# Patient Record
Sex: Male | Born: 1950 | Race: White | Hispanic: No | Marital: Married | Smoking: Former smoker
Health system: Southern US, Community
[De-identification: ages and names within clinical notes are randomized; demographics above are authoritative.]

## PROBLEM LIST (undated history)

## (undated) DIAGNOSIS — I10 Essential (primary) hypertension: Secondary | ICD-10-CM

## (undated) DIAGNOSIS — E785 Hyperlipidemia, unspecified: Secondary | ICD-10-CM

## (undated) DIAGNOSIS — I209 Angina pectoris, unspecified: Secondary | ICD-10-CM

## (undated) DIAGNOSIS — F419 Anxiety disorder, unspecified: Secondary | ICD-10-CM

## (undated) DIAGNOSIS — I639 Cerebral infarction, unspecified: Secondary | ICD-10-CM

## (undated) DIAGNOSIS — K219 Gastro-esophageal reflux disease without esophagitis: Secondary | ICD-10-CM

## (undated) DIAGNOSIS — G459 Transient cerebral ischemic attack, unspecified: Secondary | ICD-10-CM

## (undated) DIAGNOSIS — I2510 Atherosclerotic heart disease of native coronary artery without angina pectoris: Secondary | ICD-10-CM

## (undated) DIAGNOSIS — M1990 Unspecified osteoarthritis, unspecified site: Secondary | ICD-10-CM

## (undated) DIAGNOSIS — F1910 Other psychoactive substance abuse, uncomplicated: Secondary | ICD-10-CM

## (undated) DIAGNOSIS — I219 Acute myocardial infarction, unspecified: Secondary | ICD-10-CM

## (undated) HISTORY — DX: Essential (primary) hypertension: I10

## (undated) HISTORY — DX: Gastro-esophageal reflux disease without esophagitis: K21.9

## (undated) HISTORY — DX: Hyperlipidemia, unspecified: E78.5

## (undated) HISTORY — DX: Anxiety disorder, unspecified: F41.9

## (undated) HISTORY — DX: Acute myocardial infarction, unspecified: I21.9

## (undated) HISTORY — DX: Atherosclerotic heart disease of native coronary artery without angina pectoris: I25.10

## (undated) HISTORY — DX: Transient cerebral ischemic attack, unspecified: G45.9

## (undated) HISTORY — PX: COLONOSCOPY: SHX174

---

## 2012-03-27 ENCOUNTER — Encounter (HOSPITAL_COMMUNITY): Payer: Self-pay

## 2012-03-27 ENCOUNTER — Emergency Department (HOSPITAL_COMMUNITY): Payer: Self-pay

## 2012-03-27 ENCOUNTER — Emergency Department (HOSPITAL_COMMUNITY)
Admission: EM | Admit: 2012-03-27 | Discharge: 2012-03-27 | Discharge status: Against Medical Advice | Payer: Self-pay | Attending: Emergency Medicine | Admitting: Emergency Medicine

## 2012-03-27 DIAGNOSIS — F17200 Nicotine dependence, unspecified, uncomplicated: Secondary | ICD-10-CM | POA: Insufficient documentation

## 2012-03-27 DIAGNOSIS — Z7982 Long term (current) use of aspirin: Secondary | ICD-10-CM | POA: Insufficient documentation

## 2012-03-27 DIAGNOSIS — M6281 Muscle weakness (generalized): Secondary | ICD-10-CM | POA: Insufficient documentation

## 2012-03-27 DIAGNOSIS — R531 Weakness: Secondary | ICD-10-CM

## 2012-03-27 LAB — CBC WITH DIFFERENTIAL/PLATELET
Basophils Absolute: 0 10*3/uL (ref 0.0–0.1)
Eosinophils Relative: 0 % (ref 0–5)
HCT: 46.6 % (ref 39.0–52.0)
Hemoglobin: 16.5 g/dL (ref 13.0–17.0)
Lymphocytes Relative: 11 % — ABNORMAL LOW (ref 12–46)
Lymphs Abs: 1.1 10*3/uL (ref 0.7–4.0)
MCV: 96.7 fL (ref 78.0–100.0)
Monocytes Absolute: 0.7 10*3/uL (ref 0.1–1.0)
Monocytes Relative: 7 % (ref 3–12)
RDW: 12.6 % (ref 11.5–15.5)
WBC: 10 10*3/uL (ref 4.0–10.5)

## 2012-03-27 LAB — CK TOTAL AND CKMB (NOT AT ARMC)
CK, MB: 2.2 ng/mL (ref 0.3–4.0)
Relative Index: INVALID (ref 0.0–2.5)
Total CK: 66 U/L (ref 7–232)

## 2012-03-27 LAB — COMPREHENSIVE METABOLIC PANEL
BUN: 8 mg/dL (ref 6–23)
CO2: 27 mEq/L (ref 19–32)
Calcium: 9.6 mg/dL (ref 8.4–10.5)
Creatinine, Ser: 0.73 mg/dL (ref 0.50–1.35)
GFR calc Af Amer: >90 mL/min (ref >90)
GFR calc non Af Amer: >90 mL/min (ref >90)
Glucose, Bld: 126 mg/dL — ABNORMAL HIGH (ref 70–99)
Total Bilirubin: 0.6 mg/dL (ref 0.3–1.2)

## 2012-03-27 MED ORDER — LISINOPRIL 20 MG PO TABS: 20.0000 mg | ORAL_TABLET | Freq: Every day | ORAL | Status: DC

## 2012-03-27 MED ORDER — ASPIRIN 81 MG PO CHEW: 81.0000 mg | CHEWABLE_TABLET | Freq: Every day | ORAL | Status: AC

## 2012-03-27 NOTE — ED Provider Notes (Addendum)
61 year old male had about 20 minute episode of numbness in the right side of his nose, right arm, right leg. Symptoms have completely resolved. He noticed he had difficulty holding things in his right hand but did not have any overt weakness. He is a smoker and has not seen a doctor for a long time. His neurologic exam is completely normal. Funduscopic exam is unremarkable. He is noted to be quite hypertensive. He clearly had a TIA and needs to have a TIA with a workup as well as medication started for his blood pressure. Patient is very leery about staying in the hospital. I have pointed out that there is an opportunity cost if he delays getting the TIA evaluation and that he could have a stroke which could have been preventable. He is advised that he can leave AMA if he so desires, but that standard of care is to proceed with TIA work up in the emergency department through the CDU.  Dione Booze, MD 03/27/12 1732   Date: 03/28/2012  Rate: 86  Rhythm: normal sinus rhythm  QRS Axis: normal  Intervals: normal  ST/T Wave abnormalities: normal  Conduction Disutrbances:none  Narrative Interpretation: Left ventricular hypertrophy. No prior ECG available for comparison.  Old EKG Reviewed: none available    Dione Booze, MD 03/28/12 1445

## 2012-03-27 NOTE — ED Provider Notes (Signed)
History     CSN: [id number]  Arrival date & time 03/27/12  1337   First MD Initiated Contact with Patient 03/27/12 1618      Chief Complaint  Patient presents with  . Cerebrovascular Accident    (Consider location/radiation/quality/duration/timing/severity/associated sxs/prior treatment) Patient is a 61 y.o. male presenting with Acute Neurological Problem. The history is provided by the patient.  Cerebrovascular Accident This is a new problem. The current episode started today. The problem occurs rarely. The problem has been resolved. Associated symptoms include numbness (numbness to the right face right arm right leg for 15 minutes.). Pertinent negatives include no abdominal pain, chest pain, congestion, coughing, fatigue, fever, headaches, nausea, neck pain, rash, sore throat, vomiting or weakness. Nothing aggravates the symptoms. He has tried nothing for the symptoms. The treatment provided no relief.    History reviewed. No pertinent past medical history.  History reviewed. No pertinent past surgical history.  History reviewed. No pertinent family history.  History  Substance Use Topics  . Smoking status: Current Everyday Smoker  . Smokeless tobacco: Not on file  . Alcohol Use: Yes      Review of Systems  Constitutional: Negative for fever, activity change, appetite change and fatigue.  HENT: Negative for congestion, sore throat, facial swelling, rhinorrhea, trouble swallowing, neck pain, neck stiffness, voice change and sinus pressure.   Eyes: Negative.   Respiratory: Negative for cough, choking, chest tightness, shortness of breath and wheezing.   Cardiovascular: Negative for chest pain.  Gastrointestinal: Negative for nausea, vomiting and abdominal pain.  Genitourinary: Negative for dysuria, urgency, frequency, hematuria, flank pain and difficulty urinating.  Musculoskeletal: Negative for back pain and gait problem.  Skin: Negative for rash and wound.    Neurological: Positive for numbness (numbness to the right face right arm right leg for 15 minutes.). Negative for seizures, syncope, facial asymmetry, speech difficulty, weakness, light-headedness and headaches.  Psychiatric/Behavioral: Negative for behavioral problems, confusion and agitation. The patient is not nervous/anxious and is not hyperactive.   All other systems reviewed and are negative.    Allergies  Review of patient's allergies indicates no known allergies.  Home Medications   Current Outpatient Rx  Name Route Sig Dispense Refill  . ASPIRIN 81 MG PO CHEW Oral Chew 1 tablet (81 mg total) by mouth daily. 60 tablet 5  . LISINOPRIL 20 MG PO TABS Oral Take 1 tablet (20 mg total) by mouth daily. 30 tablet 3    BP 188/96  Pulse 80  Temp 98.5 F (36.9 C) (Oral)  Resp 12  SpO2 100%  Physical Exam  Nursing note and vitals reviewed. Constitutional: He is oriented to person, place, and time. He appears well-developed and well-nourished. No distress.  HENT:  Head: Normocephalic and atraumatic.  Right Ear: External ear normal.  Left Ear: External ear normal.  Mouth/Throat: No oropharyngeal exudate.  Eyes: Conjunctivae and EOM are normal. Pupils are equal, round, and reactive to light. Right eye exhibits no discharge. Left eye exhibits no discharge.  Neck: Normal range of motion. Neck supple. No JVD present. No tracheal deviation present. No thyromegaly present.  Cardiovascular: Normal rate, regular rhythm, normal heart sounds and intact distal pulses.  Exam reveals no gallop and no friction rub.   No murmur heard. Pulmonary/Chest: Effort normal and breath sounds normal. No respiratory distress. He has no wheezes. He exhibits no tenderness.  Abdominal: Soft. Bowel sounds are normal. He exhibits no distension. There is no tenderness. There is no rebound and no guarding.  Musculoskeletal: Normal range of motion. He exhibits no edema and no tenderness.  Lymphadenopathy:    He  has no cervical adenopathy.  Neurological: He is alert and oriented to person, place, and time. He has normal strength and normal reflexes. No cranial nerve deficit or sensory deficit. He displays a negative Romberg sign. GCS eye subscore is 4. GCS verbal subscore is 5. GCS motor subscore is 6.  Skin: Skin is warm and dry. No rash noted. He is not diaphoretic. No pallor.  Psychiatric: He has a normal mood and affect. His behavior is normal.    ED Course  Procedures (including critical care time)  Labs Reviewed  CBC WITH DIFFERENTIAL - Abnormal; Notable for the following:    MCH 34.2 (*)     Neutrophils Relative 82 (*)     Neutro Abs 8.1 (*)     Lymphocytes Relative 11 (*)     All other components within normal limits  COMPREHENSIVE METABOLIC PANEL - Abnormal; Notable for the following:    Glucose, Bld 126 (*)     All other components within normal limits  CK TOTAL AND CKMB  POCT I-STAT TROPONIN I   Dg Chest 2 View  03/27/2012  *RADIOLOGY REPORT*  Clinical Data: Right side weakness.  CHEST - 2 VIEW  Comparison: None.  Findings: Lungs are clear.  Heart size is normal.  No pneumothorax or pleural fluid.  No focal bony abnormality.  IMPRESSION: No acute disease.  Original Report Authenticated By: Bernadene Bell. Maricela Curet, M.D.   Ct Head Wo Contrast  03/27/2012  *RADIOLOGY REPORT*  Clinical Data: Facial numbness.  Right arm numbness  CT HEAD WITHOUT CONTRAST  Technique:  Contiguous axial images were obtained from the base of the skull through the vertex without contrast.  Comparison: None.  Findings: Ventricle size is normal.  Negative for acute infarct. Negative for hemorrhage or mass lesion.  Small hypodensity in the left putamen appears chronic.  Brainstem and cerebellum are normal. Calvarium is intact.  IMPRESSION: No acute abnormality.  Original Report Authenticated By: Camelia Phenes, M.D.     1. Right sided weakness       MDM  61 year old male patient with no known past medical  history that does not, followup with primary care physician presents with stroke like symptoms. Patient says that he was in his normal state of health today when he felt the right side of his face was numb then felt the right side of his body go nonoccluding his right arm and his right leg. The symptoms lasted 15 minutes and resolved. Patient did not have any weakness difficulty speaking nausea vomiting headache neck pain chest pain or shortness of breath. Here patient with elevated blood pressure that reduced to 170s over 90s. Patient possibly with TIA. Patient had EKG as described below chest x-ray labs which were unremarkable lungs are normal head CT. Given descriptions it was felt the patient could be having TIA. Patient was recommended for ED TIA protocol but patient refused further workup after the CT. He was discussed with patient that MRI was needed to help make the diagnosis of TIA and that without sufficient workup patient is at high risk for having stroke. Patient counseled to 20 minutes and was able to tell us back the risks of not getting the imaging. Patient said he dry followup with primary care physician. Patient was signed out against medical device with a prescription for aspirin and lisinopril given his high blood pressure and his risk for  stroke and was told to followup immediately with his primary care physician for further workup. Patient expressed understanding of this plan and wanted to leave AGAINST MEDICAL ADVICE.  Results for orders placed during the hospital encounter of 03/27/12  CBC WITH DIFFERENTIAL      Component Value Range   WBC 10.0  4.0 - 10.5 K/uL   RBC 4.82  4.22 - 5.81 MIL/uL   Hemoglobin 16.5  13.0 - 17.0 g/dL   HCT 40.9  81.1 - 91.4 %   MCV 96.7  78.0 - 100.0 fL   MCH 34.2 (*) 26.0 - 34.0 pg   MCHC 35.4  30.0 - 36.0 g/dL   RDW 78.2  95.6 - 21.3 %   Platelets 258  150 - 400 K/uL   Neutrophils Relative 82 (*) 43 - 77 %   Neutro Abs 8.1 (*) 1.7 - 7.7 K/uL    Lymphocytes Relative 11 (*) 12 - 46 %   Lymphs Abs 1.1  0.7 - 4.0 K/uL   Monocytes Relative 7  3 - 12 %   Monocytes Absolute 0.7  0.1 - 1.0 K/uL   Eosinophils Relative 0  0 - 5 %   Eosinophils Absolute 0.0  0.0 - 0.7 K/uL   Basophils Relative 0  0 - 1 %   Basophils Absolute 0.0  0.0 - 0.1 K/uL  COMPREHENSIVE METABOLIC PANEL      Component Value Range   Sodium 138  135 - 145 mEq/L   Potassium 4.6  3.5 - 5.1 mEq/L   Chloride 98  96 - 112 mEq/L   CO2 27  19 - 32 mEq/L   Glucose, Bld 126 (*) 70 - 99 mg/dL   BUN 8  6 - 23 mg/dL   Creatinine, Ser 0.86  0.50 - 1.35 mg/dL   Calcium 9.6  8.4 - 57.8 mg/dL   Total Protein 7.5  6.0 - 8.3 g/dL   Albumin 4.7  3.5 - 5.2 g/dL   AST 25  0 - 37 U/L   ALT 21  0 - 53 U/L   Alkaline Phosphatase 59  39 - 117 U/L   Total Bilirubin 0.6  0.3 - 1.2 mg/dL   GFR calc non Af Amer >90  >90 mL/min   GFR calc Af Amer >90  >90 mL/min  CK TOTAL AND CKMB      Component Value Range   Total CK 66  7 - 232 U/L   CK, MB 2.2  0.3 - 4.0 ng/mL   Relative Index RELATIVE INDEX IS INVALID  0.0 - 2.5  POCT I-STAT TROPONIN I      Component Value Range   Troponin i, poc 0.00  0.00 - 0.08 ng/mL   Comment 3                CT Head Wo Contrast (Final result)   Result time:03/27/12 1608    Final result by Rad Results In Interface (03/27/12 16:08:47)    Narrative:   *RADIOLOGY REPORT*  Clinical Data: Facial numbness. Right arm numbness  CT HEAD WITHOUT CONTRAST  Technique: Contiguous axial images were obtained from the base of the skull through the vertex without contrast.  Comparison: None.  Findings: Ventricle size is normal. Negative for acute infarct. Negative for hemorrhage or mass lesion. Small hypodensity in the left putamen appears chronic. Brainstem and cerebellum are normal. Calvarium is intact.  IMPRESSION: No acute abnormality.  Original Report Authenticated By: Camelia Phenes, M.D.  DG Chest 2 View (Final result)   Result  time:03/27/12 1414    Final result by Rad Results In Interface (03/27/12 14:14:18)    Narrative:   *RADIOLOGY REPORT*  Clinical Data: Right side weakness.  CHEST - 2 VIEW  Comparison: None.  Findings: Lungs are clear. Heart size is normal. No pneumothorax or pleural fluid. No focal bony abnormality.  IMPRESSION: No acute disease.  Original Report Authenticated By: Bernadene Bell. Maricela Curet, M.D.    Date: 03/27/2012  Rate: 86  Rhythm: normal sinus rhythm  QRS Axis: normal  Intervals: normal  ST/T Wave abnormalities: normal  Conduction Disutrbances:none  Narrative Interpretation: LVH  Old EKG Reviewed: none available   Case discussed with Dr. Blanche East, MD 03/27/12 2241

## 2012-03-27 NOTE — ED Notes (Signed)
numnbness in face and right arm did not want to work correctly, pt with elevated bp in triage, reports now is back at baseline.

## 2012-03-27 NOTE — ED Notes (Signed)
Glick MD at bedside  

## 2012-03-27 NOTE — ED Notes (Signed)
Pt says that he request to leave, notified Lew Dawes, resident of the same, pt will be signing out AMA

## 2012-03-27 NOTE — ED Notes (Signed)
Patient transported to CT 

## 2012-03-28 NOTE — ED Provider Notes (Signed)
I saw and evaluated the patient, reviewed the resident's note and I agree with the findings and plan.   Dione Booze, MD 03/28/12 [phone]

## 2015-04-15 DIAGNOSIS — Z87898 Personal history of other specified conditions: Secondary | ICD-10-CM | POA: Insufficient documentation

## 2015-04-15 DIAGNOSIS — I10 Essential (primary) hypertension: Secondary | ICD-10-CM | POA: Insufficient documentation

## 2018-04-22 DIAGNOSIS — F1090 Alcohol use, unspecified, uncomplicated: Secondary | ICD-10-CM | POA: Insufficient documentation

## 2019-12-12 ENCOUNTER — Ambulatory Visit: Payer: Self-pay | Admitting: Physician Assistant

## 2021-03-22 DIAGNOSIS — I493 Ventricular premature depolarization: Secondary | ICD-10-CM | POA: Insufficient documentation

## 2021-05-04 LAB — COLOGUARD: COLOGUARD: POSITIVE — AB

## 2021-06-08 ENCOUNTER — Encounter: Payer: Self-pay | Admitting: Gastroenterology

## 2021-06-29 ENCOUNTER — Other Ambulatory Visit: Payer: Self-pay

## 2021-06-29 ENCOUNTER — Encounter: Payer: Self-pay | Admitting: Cardiology

## 2021-06-29 ENCOUNTER — Ambulatory Visit: Payer: Medicare PPO | Admitting: Cardiology

## 2021-06-29 VITALS — BP 163/88 | HR 88 | Temp 98.0°F | Resp 16 | Ht 70.0 in | Wt 176.0 lb

## 2021-06-29 DIAGNOSIS — R079 Chest pain, unspecified: Secondary | ICD-10-CM | POA: Insufficient documentation

## 2021-06-29 DIAGNOSIS — E782 Mixed hyperlipidemia: Secondary | ICD-10-CM

## 2021-06-29 DIAGNOSIS — I10 Essential (primary) hypertension: Secondary | ICD-10-CM

## 2021-06-29 DIAGNOSIS — I209 Angina pectoris, unspecified: Secondary | ICD-10-CM

## 2021-06-29 MED ORDER — NITROGLYCERIN 0.4 MG SL SUBL: 0.4000 mg | SUBLINGUAL_TABLET | SUBLINGUAL | 3 refills | Status: DC | PRN

## 2021-06-29 MED ORDER — ISOSORBIDE MONONITRATE ER 30 MG PO TB24
30.0000 mg | ORAL_TABLET | Freq: Every day | ORAL | 3 refills | Status: DC
Start: 2021-06-29 — End: 2021-07-20

## 2021-06-29 NOTE — Progress Notes (Signed)
Patient referred by [name], MD for exertional chest pain  Subjective:   Edgar Garcia, male    DOB: 29-Mar-1951, 70 y.o.   MRN: [id number]   Chief Complaint  Patient presents with   Chest Pain   Hyperlipidemia     HPI  70 year old male with hypertension, hyperlipidemia, prediabetes, heavy alcohol use, referred for evaluation of exertional chest pain.  Patient is here with his wife today.  He is a Curator, works in Scientific laboratory technician.  He has had hypertension hyperlipidemia for 9 years, but he was.  Most with a TIA episode.  He since been on lisinopril 40 mg daily, amlodipine 10 mg daily, aspirin 81 mg daily, Crestor 10 mg daily.  He stays active, walking 2-3 miles several days a week.  Recently, he has noted episodes of personal burning sensation occurs at top of the head of the hill, improves with rest.  Episodes occur on a regular basis, and fairly impressive up with the symptoms or physical activity.  Blood pressure is elevated today, but usually much lower than this.  He notices that blood pressure is always elevated physician visits, and much lower at home.  He denies any recurrent TIA, stroke symptoms since his 1 episode of TIA 9 years ago.  He denies any shortness of breath, palpitation, orthopnea, PND symptoms.  On a separate note, he recently tested positive for colitis.  He has an upcoming appointment with gastroenterology next week.  Past Medical History:  Diagnosis Date   Hyperlipidemia    TIA (transient ischemic attack)      History reviewed. No pertinent surgical history.   Social History   Tobacco Use  Smoking Status Every Day  Smokeless Tobacco Not on file    Social History   Substance and Sexual Activity  Alcohol Use Yes   Comment: everyday     Family History  Problem Relation Age of Onset   Cancer Mother    Hypertension Sister    Hypertension Sister    Hypertension Sister    Hypertension Brother      Current Outpatient  Medications on File Prior to Visit  Medication Sig Dispense Refill   amLODipine (NORVASC) 5 MG tablet Take 1 tablet by mouth daily.     aspirin 81 MG EC tablet Take 1 tablet by mouth daily.     atorvastatin (LIPITOR) 10 MG tablet Take 1 tablet by mouth daily at 6 (six) AM.     lisinopril (PRINIVIL,ZESTRIL) 20 MG tablet Take 1 tablet (20 mg total) by mouth daily. (Patient taking differently: Take 40 mg by mouth daily.) 30 tablet 3   vitamin B-12 (CYANOCOBALAMIN) 500 MCG tablet Take 1 tablet by mouth daily at 6 (six) AM.     No current facility-administered medications on file prior to visit.    Cardiovascular and other pertinent studies:  EKG 06/29/2021: Sinus rhythm 84 bpm baseline artifact    Recent labs: 06/14/2021: Glucose 96, BUN/Cr 7/0.72. EGFR >90. Na/K 143/4.2. Albumin 5.1. Rest of the CMP normal H/H 16/47. MCV 99.7. Platelets 288 HbA1C 5.5% Chol 164, TG 64, HDL 69, LDL 84 TSH N/A   Review of Systems  Cardiovascular:  Positive for chest pain. Negative for dyspnea on exertion, leg swelling, palpitations and syncope.  Gastrointestinal:        Positive Cologuard test, upcoming visit with gastroenterology.        Vitals:   06/29/21 1321 06/29/21 1334  BP: (!) 185/99 (!) 178/96  Pulse: 96  91  Resp: 16   Temp: 98 F (36.7 C)   SpO2: 97% 97%     Body mass index is 25.25 kg/m. Filed Weights   06/29/21 1321  Weight: 176 lb (79.8 kg)     Objective:   Physical Exam Vitals and nursing note reviewed.  Constitutional:      General: He is not in acute distress. Neck:     Vascular: No JVD.  Cardiovascular:     Rate and Rhythm: Normal rate and regular rhythm.     Pulses: Normal pulses.     Heart sounds: Normal heart sounds. No murmur heard. Pulmonary:     Effort: Pulmonary effort is normal.     Breath sounds: Normal breath sounds. No wheezing or rales.  Musculoskeletal:     Right lower leg: No edema.     Left lower leg: No edema.         Assessment &  Recommendations:    70 year old male with hypertension, hyperlipidemia, prediabetes, heavy alcohol use, referred for evaluation of exertional chest pain.  Exertional chest pain: Symptoms suggestive of angina pectoris.  Continue aspirin 81 mg daily. Started Imdur 30 mg daily.  Also prescribed sublingual nitroglycerin for as needed use. Continue Crestor 10 mg daily for now. Recommend exercise nuclear stress test and echocardiogram. Even if he does have abnormality on stress test, his cardiac risk for colonoscopy should be acceptable.  Further recommendations after above testing.  Hypertension: Blood pressure elevated, suspect component of whitecoat hypertension. With an addition of Imdur, no changes made to his current antihypertensive therapy.  Thank you for referring the patient to [place]. Please feel free to contact with any questions.   Nigel Mormon, MD Pager: [phone] Office: [phone]

## 2021-07-08 ENCOUNTER — Encounter: Payer: Self-pay | Admitting: Gastroenterology

## 2021-07-08 ENCOUNTER — Ambulatory Visit: Payer: Medicare PPO | Admitting: Gastroenterology

## 2021-07-08 ENCOUNTER — Telehealth: Payer: Self-pay | Admitting: *Deleted

## 2021-07-08 VITALS — BP 160/98 | HR 77 | Ht 70.0 in | Wt 175.0 lb

## 2021-07-08 DIAGNOSIS — R195 Other fecal abnormalities: Secondary | ICD-10-CM

## 2021-07-08 MED ORDER — PLENVU 140 G PO SOLR: 1.0000 | ORAL | 0 refills | Status: DC

## 2021-07-08 NOTE — Patient Instructions (Addendum)
You have been scheduled for a colonoscopy. Please follow written instructions given to you at your visit today.  Please pick up your prep supplies at the pharmacy within the next 1-3 days. If you use inhalers (even only as needed), please bring them with you on the day of your procedure. _______________________________________________________ If you are age 72 or older, your body mass index should be between 23-30. Your Body mass index is 25.11 kg/m. If this is out of the aforementioned range listed, please consider follow up with your Primary Care Provider.  If you are age 41 or younger, your body mass index should be between 19-25. Your Body mass index is 25.11 kg/m. If this is out of the aformentioned range listed, please consider follow up with your Primary Care Provider.  ________________________________________________________  The [facility] providers would like to encourage you to use [facility] to communicate with providers for non-urgent requests or questions.  Due to long hold times on the telephone, sending your provider a message by [facility] may be a faster and more efficient way to get a response.  Please allow 48 business hours for a response.  Please remember that this is for non-urgent requests.  _______________________________________________________ Due to recent changes in healthcare laws, you may see the results of your imaging and laboratory studies on MyChart before your provider has had a chance to review them.  We understand that in some cases there may be results that are confusing or concerning to you. Not all laboratory results come back in the same time frame and the provider may be waiting for multiple results in order to interpret others.  Please give [place] 48 hours in order for your provider to thoroughly review all the results before contacting the office for clarification of your results.

## 2021-07-08 NOTE — Progress Notes (Signed)
Referring Provider: [name], MD Primary Care Physician:  [name], MD   Reason for Consultation:  Cologuard + 9/22   IMPRESSION:   Cologuard + without overt bleeding or anemia Stress test planned for next week     -No cardiac symptoms but he has multiple risk factors and a strong family history    -Cardiologist planning a stress test next week Family history of colon polyps (brother)   PLAN: Colonoscopy after clearance by cardiology  I spent over 30 minutes, including independent review of results, communicating results with the patient directly, face-to-face time with the patient, coordinating care, ordering studies and medications as appropriate, and documentation.      HPI: Edgar Garcia is a 70 y.o. male referred for + cologuard.  No symptoms associated with the + cologuard.    No overt GI blood loss. No melena, hematochezia, bright red blood per rectum. No epistaxis, vaginal bleeding, hemoptysis, or hematuria.  He reports a formed bowel movement daily.  No recent changes in bowel habits.  He has largely avoided doctors. No prior colon cancer or screening. He has a history of heavy alcohol consumption.  Referral records mention hypertension, hyperlipidemia, prediabetes, and a history of transient cerebral ischemia in 2013.  He was referred to cardiology given high risk with a history of smoking and a strong family history.  He has a cardiac stress test Monday.  The cardiologist told him to wait to have his colonoscopy until after the stress test.  Brother with colon polyps. No other family history of colon cancer or polyps. Mother and maternal aunt with leukemia. No family history of GI malignancy or uterine or endometrial cancer.   Labs 06/11/2020 showing normal CBC except for an MCV Of 1.3 and RDW of 12.1.  Liver Enzymes Were Normal.  Past Medical History:  Diagnosis Date   Hyperlipidemia    TIA (transient ischemic attack)     History reviewed. No  pertinent surgical history.   Current Outpatient Medications  Medication Sig Dispense Refill   amLODipine (NORVASC) 5 MG tablet Take 1 tablet by mouth daily.     aspirin 81 MG EC tablet Take 1 tablet by mouth daily.     atorvastatin (LIPITOR) 10 MG tablet Take 1 tablet by mouth daily at 6 (six) AM.     isosorbide mononitrate (IMDUR) 30 MG 24 hr tablet Take 1 tablet (30 mg total) by mouth daily. 90 tablet 3   lisinopril (ZESTRIL) 40 MG tablet Take 40 mg by mouth daily.     nitroGLYCERIN (NITROSTAT) 0.4 MG SL tablet Place 1 tablet (0.4 mg total) under the tongue every 5 (five) minutes as needed for chest pain. 90 tablet 3   PEG-KCl-NaCl-NaSulf-Na Asc-C (PLENVU) 140 g SOLR Take 1 kit by mouth as directed. Use coupon: BIN: [id number] PNC: CNRX Group: [id number] ID: [id number] 1 each 0   vitamin B-12 (CYANOCOBALAMIN) 500 MCG tablet Take 1 tablet by mouth daily at 6 (six) AM.     lisinopril (PRINIVIL,ZESTRIL) 20 MG tablet Take 1 tablet (20 mg total) by mouth daily. (Patient taking differently: Take 40 mg by mouth daily.) 30 tablet 3   No current facility-administered medications for this visit.    Allergies as of 07/08/2021   (No Known Allergies)    Family History  Problem Relation Age of Onset   Cancer Mother    Hypertension Sister    Hypertension Sister    Hypertension Sister    Hypertension Brother  Social History   Socioeconomic History   Marital status: Married    Spouse name: Not on file   Number of children: 3   Years of education: Not on file   Highest education level: Not on file  Occupational History   Not on file  Tobacco Use   Smoking status: Former    Packs/day: 0.50    Years: 25.00    Pack years: 12.50    Types: Cigarettes    Quit date: 2013    Years since quitting: 9.8   Smokeless tobacco: Never  Vaping Use   Vaping Use: Never used  Substance and Sexual Activity   Alcohol use: Yes    Comment: everyday   Drug use: No   Sexual activity: Not on file   Other Topics Concern   Not on file  Social History Narrative   Not on file   Social Determinants of Health   Financial Resource Strain: Not on file  Food Insecurity: Not on file  Transportation Needs: Not on file  Physical Activity: Not on file  Stress: Not on file  Social Connections: Not on file  Intimate Partner Violence: Not on file    Review of Systems: 12 system ROS is negative except as noted above.   Physical Exam: General:   Alert,  well-nourished, pleasant and cooperative in NAD Head:  Normocephalic and atraumatic. Eyes:  Sclera clear, no icterus.   Conjunctiva pink. Ears:  Normal auditory acuity. Nose:  No deformity, discharge,  or lesions. Mouth:  No deformity or lesions.   Neck:  Supple; no masses or thyromegaly. Lungs:  Clear throughout to auscultation.   No wheezes. Heart:  Regular rate and rhythm; no murmurs. Abdomen:  Soft,nontender, nondistended, normal bowel sounds, no rebound or guarding. No hepatosplenomegaly.   Rectal:  Deferred  Msk:  Symmetrical. No boney deformities LAD: No inguinal or umbilical LAD Extremities:  No clubbing or edema. Neurologic:  Alert and  oriented x4;  grossly nonfocal Skin:  Intact without significant lesions or rashes. Psych:  Alert and cooperative. Normal mood and affect.      Takeria Marquina L. Tarri Glenn, MD, MPH 07/08/2021, 11:45 AM

## 2021-07-08 NOTE — Telephone Encounter (Signed)
   Edgar Garcia 09-22-1950 [id number]  Dear Dr Rae Lips:  We have scheduled the above named patient for a colonoscopy procedure. Our records show that he is currently undergoing cardiac evaluation (stress test 07/11/21).  Please advise as to whether the patient is cleared by cardiology for colonoscopy procedure.  Please route your response to Dixon Boos, [place] or fax response to [phone].  Sincerely,    [facility]

## 2021-07-11 ENCOUNTER — Ambulatory Visit: Payer: Medicare PPO

## 2021-07-11 ENCOUNTER — Other Ambulatory Visit: Payer: Self-pay

## 2021-07-11 DIAGNOSIS — R079 Chest pain, unspecified: Secondary | ICD-10-CM

## 2021-07-12 NOTE — Telephone Encounter (Signed)
Stress test shows significant abnormalities. After initial medical management for possible CAD, next step would be coronary angiography and possible intervention. Should he have obstructive CAD, would coronary intervention and 3-6 months DAPT and delaying colonoscopy after that be safe? If bleeding risks are high, he could undergo colonoscopy with acceptable cardiac risk, followed by coronary angiography and intervention, as required.  Appreciate your input.  Regards, Dr. Virgina Jock

## 2021-07-15 NOTE — Telephone Encounter (Signed)
If no active bleeding, I would recommend doing coronary angiography and intervention first. If he requires stenting, I would recommend 3-6 months of DAPT. Is that okay from your standpoint?  Thanks MJP

## 2021-07-15 NOTE — Telephone Encounter (Signed)
LVM requesting returned call. Below procedure has been canceled per cardiology request as additional cardiac work up is required.  Appointment Information  Name: Edgar Garcia, Edgar Garcia MRN: [id number]  Date: 08/01/2021 Status: Can  Time: 3:00 PM Length: 30  Visit Type: PROPOFOL COLON [1272] Copay: $0.00  Provider: Thornton Park, MD Department: [facility]  Referring Provider: [name] CSN: [id number]  Notes: positive cologuard/dns  Made On: Canceled: 07/08/2021 11:24 AM 07/15/2021 1:59 PM By: By: Runell Gess, Margarete Horace J  Cancel Rsn: Provider (Requires additional cardiology f/u per cardiologist)

## 2021-07-15 NOTE — Telephone Encounter (Signed)
Pt returned call. Advised about cancellation and rationale for cancellation. Verbalized acceptance and understanding.

## 2021-07-15 NOTE — Telephone Encounter (Signed)
Stress test had significant abnormalities. Recommend holding of colonoscopy until further cardiac workup is completed. Please let him know that I will discuss more regarding further recommendations at next visit.   Thanks MJP

## 2021-07-19 ENCOUNTER — Telehealth: Payer: Self-pay

## 2021-07-19 NOTE — Telephone Encounter (Signed)
Called pt, no answer. Left vm requesting call back?

## 2021-07-19 NOTE — Telephone Encounter (Signed)
Recommend holding for now. I can see him sooner than his scheduled appt. Please ask if him if tomorrow 11/2 2 PM works, and add to the schedule, if it does.  Thanks MJP

## 2021-07-19 NOTE — Telephone Encounter (Signed)
Pt called and stated that he has had headaches and dizziness since starting the IMDUR. Pt says his bp medication usually make him dizzy but it has been worse since starting the isosorbide. He said it has also been making his throat hurt occasionally. Pt would like to stop this medication. Pt's BP has consistently been down, it ranges from 110/70-120/80. Pt's pulse has been up since starting this medication, it is in the 70-80, where before it was in the 60's. Pt noticed this during his stress test. Please advise.

## 2021-07-20 ENCOUNTER — Other Ambulatory Visit: Payer: Self-pay

## 2021-07-20 ENCOUNTER — Ambulatory Visit: Payer: Medicare PPO | Admitting: Cardiology

## 2021-07-20 ENCOUNTER — Encounter: Payer: Self-pay | Admitting: Cardiology

## 2021-07-20 VITALS — BP 170/77 | HR 80 | Temp 98.0°F | Resp 16 | Ht 70.0 in | Wt 174.0 lb

## 2021-07-20 DIAGNOSIS — E782 Mixed hyperlipidemia: Secondary | ICD-10-CM

## 2021-07-20 DIAGNOSIS — R079 Chest pain, unspecified: Secondary | ICD-10-CM

## 2021-07-20 DIAGNOSIS — I10 Essential (primary) hypertension: Secondary | ICD-10-CM

## 2021-07-20 DIAGNOSIS — R9439 Abnormal result of other cardiovascular function study: Secondary | ICD-10-CM | POA: Insufficient documentation

## 2021-07-20 DIAGNOSIS — I209 Angina pectoris, unspecified: Secondary | ICD-10-CM

## 2021-07-20 LAB — PCV MYOCARDIAL PERFUSION WO LEXISCAN
Angina Index: 0
ST Depression (mm): 1.5 mm
TID: 1.23

## 2021-07-20 MED ORDER — METOPROLOL SUCCINATE ER 25 MG PO TB24: 25.0000 mg | ORAL_TABLET | Freq: Every day | ORAL | 3 refills | Status: DC

## 2021-07-20 NOTE — Progress Notes (Signed)
Patient referred by [name], MD for exertional chest pain  Subjective:   Edgar Garcia, male    DOB: March 15, 1951, 70 y.o.   MRN: [id number]   Chief Complaint  Patient presents with   Dizziness   Headache   Follow-up     HPI  70 year old male with hypertension, hyperlipidemia, prediabetes, heavy alcohol use, referred for evaluation of exertional chest pain.  Reviewed recent stress test results with the patient, details below. Patient did not tolerate Imdur due to headache and dizziness. Today, he tells me that he did in fact have severe chest pain during the treadmill part of his nuclear stress test.   His colonoscopy for positive cologuard test is pending, but he denies any frank bleeding.   Initial consultation HPI 06/2021: Patient is here with his wife today.  He is a Curator, works in [employer] laboratory technician.  He has had hypertension hyperlipidemia for 9 years, but he was.  Most with a TIA episode.  He since been on lisinopril 40 mg daily, amlodipine 10 mg daily, aspirin 81 mg daily, Crestor 10 mg daily.  He stays active, walking 2-3 miles several days a week.  Recently, he has noted episodes of personal burning sensation occurs at top of the head of the hill, improves with rest.  Episodes occur on a regular basis, and fairly impressive up with the symptoms or physical activity.  Blood pressure is elevated today, but usually much lower than this.  He notices that blood pressure is always elevated physician visits, and much lower at home.  He denies any recurrent TIA, stroke symptoms since his 1 episode of TIA 9 years ago.  He denies any shortness of breath, palpitation, orthopnea, PND symptoms.  On a separate note, he recently tested positive for colitis.  He has an upcoming appointment with gastroenterology next week.  Current Outpatient Medications on File Prior to Visit  Medication Sig Dispense Refill   amLODipine (NORVASC) 5 MG tablet Take 1 tablet by mouth daily.      aspirin 81 MG EC tablet Take 1 tablet by mouth daily.     atorvastatin (LIPITOR) 10 MG tablet Take 1 tablet by mouth daily at 6 (six) AM.     isosorbide mononitrate (IMDUR) 30 MG 24 hr tablet Take 1 tablet (30 mg total) by mouth daily. 90 tablet 3   lisinopril (PRINIVIL,ZESTRIL) 20 MG tablet Take 1 tablet (20 mg total) by mouth daily. (Patient taking differently: Take 40 mg by mouth daily.) 30 tablet 3   lisinopril (ZESTRIL) 40 MG tablet Take 40 mg by mouth daily.     nitroGLYCERIN (NITROSTAT) 0.4 MG SL tablet Place 1 tablet (0.4 mg total) under the tongue every 5 (five) minutes as needed for chest pain. 90 tablet 3   PEG-KCl-NaCl-NaSulf-Na Asc-C (PLENVU) 140 g SOLR Take 1 kit by mouth as directed. Use coupon: BIN: [id number] PNC: CNRX Group: [id number] ID: [id number] 1 each 0   vitamin B-12 (CYANOCOBALAMIN) 500 MCG tablet Take 1 tablet by mouth daily at 6 (six) AM.     No current facility-administered medications on file prior to visit.    Cardiovascular and other pertinent studies:  Exercise Tetrofosmin stress test 07/12/2021: Exercise nuclear stress test was performed using Bruce protocol. Patient reached 4.6 METS, and 90% of age predicted maximum heart rate. Exercise capacity was low. No chest pain reported, dyspnea and dizziness reported. Heart rate and hemodynamic response were normal. Stress EKG showed sinus tachycardia, nonspecific T wave inversion  leads III, aVF, 1-1.5 mm upsloping ST depression in leads V5, V6 that normalize 1 min into recovery. SPECT images showed small sized, medium intensity, reversible perfusion defect in apical anterior, apical myocardium.  TID 1.23. Stress LVEF 80%.  High risk study due to regional perfusion deficit with TID >1.2.   EKG 06/29/2021: Sinus rhythm 84 bpm baseline artifact   Recent labs: 06/14/2021: Glucose 96, BUN/Cr 7/0.72. EGFR >90. Na/K 143/4.2. Albumin 5.1. Rest of the CMP normal H/H 16/47. MCV 99.7. Platelets 288 HbA1C 5.5% Chol 164, TG  64, HDL 69, LDL 84 TSH N/A   Review of Systems  Cardiovascular:  Positive for chest pain. Negative for dyspnea on exertion, leg swelling, palpitations and syncope.  Gastrointestinal:        Positive Cologuard test, upcoming visit with gastroenterology.        Vitals:   07/20/21 1411 07/20/21 1414  BP: (!) 156/89 (!) 170/77  Pulse: 79 80  Resp: 16   Temp: 98 F (36.7 C)   SpO2: 97%     Body mass index is 24.97 kg/m. Filed Weights   07/20/21 1411  Weight: 174 lb (78.9 kg)     Objective:   Physical Exam Vitals and nursing note reviewed.  Constitutional:      General: He is not in acute distress. Neck:     Vascular: No JVD.  Cardiovascular:     Rate and Rhythm: Normal rate and regular rhythm.     Heart sounds: Normal heart sounds. No murmur heard. Pulmonary:     Effort: Pulmonary effort is normal.     Breath sounds: Normal breath sounds. No wheezing or rales.         Assessment & Recommendations:    70 year old male with hypertension, hyperlipidemia, prediabetes, heavy alcohol use, referred for evaluation of exertional chest pain.  Exertional chest pain: Symptoms suggestive of angina pectoris.  Continue aspirin 81 mg daily. Regional reversible perfusion defects and TD 1.23 s/o high risk findings.  Did not tolerate Imdur due to headache and dizziness.  Continue amlodipine 5 mg. Added metoprolol succinate 25 mg daily, Increase crestor to 40 mg daily for now. In addition to optimal medical therapy, also recommend coronary angiography and possible coronary intervention given his symptoms and high risk stress test findings.   Schedule for cardiac catheterization, and possible intervention. We discussed regarding risks, benefits, alternatives to this including stress testing, CTA and continued medical therapy. Patient wants to proceed. Understands <1-2% risk of death, stroke, MI, urgent CABG, bleeding, infection, renal failure but not limited to these.  I am  optimistic that we can limit his DAPT to 3-6 months with coronary stenting, after which he can proceed with colonoscopy. Fortunately, he does not have any frank bleeding at this time.  I encouraged him to reduce alcohol intake to reduce gastritis, anticipated that he may be on DAPT in the near future.   Hypertension: Blood pressure elevated, suspect component of whitecoat hypertension.Added metyoprolol.  Thank you for referring the patient to [place]. Please feel free to contact with any questions.   Nigel Mormon, MD Pager: [phone] Office: [phone]

## 2021-07-20 NOTE — Telephone Encounter (Signed)
Pt aware.

## 2021-07-22 LAB — CBC
Hematocrit: 43.8 % (ref 37.5–51.0)
Hemoglobin: 15.4 g/dL (ref 13.0–17.7)
MCH: 33.7 pg — ABNORMAL HIGH (ref 26.6–33.0)
MCHC: 35.2 g/dL (ref 31.5–35.7)
MCV: 96 fL (ref 79–97)
Platelets: 308 10*3/uL (ref 150–450)
RBC: 4.57 x10E6/uL (ref 4.14–5.80)
RDW: 11.1 % — ABNORMAL LOW (ref 11.6–15.4)
WBC: 7.8 10*3/uL (ref 3.4–10.8)

## 2021-07-22 LAB — BASIC METABOLIC PANEL
BUN/Creatinine Ratio: 10 (ref 10–24)
BUN: 8 mg/dL (ref 8–27)
CO2: 26 mmol/L (ref 20–29)
Calcium: 9.5 mg/dL (ref 8.6–10.2)
Chloride: 95 mmol/L — ABNORMAL LOW (ref 96–106)
Creatinine, Ser: 0.78 mg/dL (ref 0.76–1.27)
Glucose: 106 mg/dL — ABNORMAL HIGH (ref 70–99)
Potassium: 5 mmol/L (ref 3.5–5.2)
Sodium: 135 mmol/L (ref 134–144)
eGFR: 95 mL/min/{1.73_m2} (ref >59)

## 2021-07-25 ENCOUNTER — Ambulatory Visit: Payer: Medicare PPO | Admitting: Cardiology

## 2021-07-26 ENCOUNTER — Inpatient Hospital Stay (HOSPITAL_COMMUNITY)
Admission: RE | Disposition: A | Discharge status: Home / Self Care | Payer: Self-pay | Source: Home / Self Care | Attending: Cardiology

## 2021-07-26 ENCOUNTER — Inpatient Hospital Stay (HOSPITAL_COMMUNITY)
Admission: RE | Admit: 2021-07-26 | Discharge: 2021-07-28 | DRG: 250 | Disposition: A | Discharge status: Home / Self Care | Payer: Medicare PPO | Attending: Cardiology | Admitting: Cardiology

## 2021-07-26 ENCOUNTER — Encounter (HOSPITAL_COMMUNITY): Payer: Self-pay | Admitting: Cardiology

## 2021-07-26 ENCOUNTER — Other Ambulatory Visit: Payer: Self-pay

## 2021-07-26 DIAGNOSIS — Y838 Other surgical procedures as the cause of abnormal reaction of the patient, or of later complication, without mention of misadventure at the time of the procedure: Secondary | ICD-10-CM | POA: Diagnosis not present

## 2021-07-26 DIAGNOSIS — I4901 Ventricular fibrillation: Secondary | ICD-10-CM | POA: Diagnosis not present

## 2021-07-26 DIAGNOSIS — Z79899 Other long term (current) drug therapy: Secondary | ICD-10-CM

## 2021-07-26 DIAGNOSIS — Z8673 Personal history of transient ischemic attack (TIA), and cerebral infarction without residual deficits: Secondary | ICD-10-CM

## 2021-07-26 DIAGNOSIS — I959 Hypotension, unspecified: Secondary | ICD-10-CM | POA: Diagnosis not present

## 2021-07-26 DIAGNOSIS — I462 Cardiac arrest due to underlying cardiac condition: Secondary | ICD-10-CM | POA: Diagnosis not present

## 2021-07-26 DIAGNOSIS — I214 Non-ST elevation (NSTEMI) myocardial infarction: Secondary | ICD-10-CM | POA: Diagnosis not present

## 2021-07-26 DIAGNOSIS — I472 Ventricular tachycardia, unspecified: Secondary | ICD-10-CM | POA: Diagnosis not present

## 2021-07-26 DIAGNOSIS — E785 Hyperlipidemia, unspecified: Secondary | ICD-10-CM | POA: Diagnosis present

## 2021-07-26 DIAGNOSIS — R079 Chest pain, unspecified: Secondary | ICD-10-CM

## 2021-07-26 DIAGNOSIS — R9439 Abnormal result of other cardiovascular function study: Secondary | ICD-10-CM | POA: Diagnosis present

## 2021-07-26 DIAGNOSIS — E871 Hypo-osmolality and hyponatremia: Secondary | ICD-10-CM | POA: Diagnosis not present

## 2021-07-26 DIAGNOSIS — R7303 Prediabetes: Secondary | ICD-10-CM | POA: Diagnosis present

## 2021-07-26 DIAGNOSIS — Z7982 Long term (current) use of aspirin: Secondary | ICD-10-CM | POA: Diagnosis not present

## 2021-07-26 DIAGNOSIS — I25119 Atherosclerotic heart disease of native coronary artery with unspecified angina pectoris: Secondary | ICD-10-CM | POA: Diagnosis present

## 2021-07-26 DIAGNOSIS — I25118 Atherosclerotic heart disease of native coronary artery with other forms of angina pectoris: Secondary | ICD-10-CM | POA: Diagnosis present

## 2021-07-26 DIAGNOSIS — I209 Angina pectoris, unspecified: Secondary | ICD-10-CM | POA: Diagnosis present

## 2021-07-26 DIAGNOSIS — I97790 Other intraoperative cardiac functional disturbances during cardiac surgery: Secondary | ICD-10-CM | POA: Diagnosis not present

## 2021-07-26 DIAGNOSIS — I10 Essential (primary) hypertension: Secondary | ICD-10-CM | POA: Diagnosis present

## 2021-07-26 DIAGNOSIS — Z9861 Coronary angioplasty status: Secondary | ICD-10-CM

## 2021-07-26 HISTORY — PX: LEFT HEART CATH AND CORONARY ANGIOGRAPHY: CATH118249

## 2021-07-26 HISTORY — PX: CORONARY BALLOON ANGIOPLASTY: CATH118233

## 2021-07-26 LAB — CBC
HCT: 42.6 % (ref 39.0–52.0)
Hemoglobin: 14.9 g/dL (ref 13.0–17.0)
MCH: 33.5 pg (ref 26.0–34.0)
MCHC: 35 g/dL (ref 30.0–36.0)
MCV: 95.7 fL (ref 80.0–100.0)
Platelets: 298 10*3/uL (ref 150–400)
RBC: 4.45 MIL/uL (ref 4.22–5.81)
RDW: 11.2 % — ABNORMAL LOW (ref 11.5–15.5)
WBC: 11.3 10*3/uL — ABNORMAL HIGH (ref 4.0–10.5)
nRBC: 0 % (ref 0.0–0.2)

## 2021-07-26 LAB — POCT ACTIVATED CLOTTING TIME
Activated Clotting Time: 283 seconds
Activated Clotting Time: 324 seconds
Activated Clotting Time: 335 seconds

## 2021-07-26 LAB — CREATININE, SERUM
Creatinine, Ser: 0.71 mg/dL (ref 0.61–1.24)
GFR, Estimated: >60 mL/min (ref >60)

## 2021-07-26 LAB — MRSA NEXT GEN BY PCR, NASAL: MRSA by PCR Next Gen: NOT DETECTED

## 2021-07-26 SURGERY — LEFT HEART CATH AND CORONARY ANGIOGRAPHY
Anesthesia: LOCAL

## 2021-07-26 MED ORDER — MIDAZOLAM HCL 2 MG/2ML IJ SOLN
INTRAMUSCULAR | Status: DC | PRN
  Administered 2021-07-26: 1 mg via INTRAVENOUS

## 2021-07-26 MED ORDER — FENTANYL CITRATE (PF) 100 MCG/2ML IJ SOLN
INTRAMUSCULAR | Status: AC
  Filled 2021-07-26: qty 2

## 2021-07-26 MED ORDER — SODIUM CHLORIDE 0.9 % WEIGHT BASED INFUSION
1.0000 mL/kg/h | INTRAVENOUS | Status: DC
  Administered 2021-07-26: 250 mL/h via INTRAVENOUS

## 2021-07-26 MED ORDER — IOHEXOL 350 MG/ML SOLN
INTRAVENOUS | Status: DC | PRN
  Administered 2021-07-26: 220 mL via INTRA_ARTERIAL

## 2021-07-26 MED ORDER — SODIUM CHLORIDE 0.9% FLUSH: 3.0000 mL | INTRAVENOUS | Status: DC | PRN

## 2021-07-26 MED ORDER — ASPIRIN 81 MG PO CHEW: 81.0000 mg | CHEWABLE_TABLET | ORAL | Status: DC

## 2021-07-26 MED ORDER — ACETAMINOPHEN 325 MG PO TABS: 650.0000 mg | ORAL_TABLET | ORAL | Status: DC | PRN

## 2021-07-26 MED ORDER — HEPARIN (PORCINE) IN NACL 1000-0.9 UT/500ML-% IV SOLN
INTRAVENOUS | Status: DC | PRN
  Administered 2021-07-26 (×2): 500 mL

## 2021-07-26 MED ORDER — CLOPIDOGREL BISULFATE 300 MG PO TABS
ORAL_TABLET | ORAL | Status: AC
  Filled 2021-07-26: qty 2

## 2021-07-26 MED ORDER — HEPARIN SODIUM (PORCINE) 1000 UNIT/ML IJ SOLN
INTRAMUSCULAR | Status: AC
  Filled 2021-07-26: qty 1

## 2021-07-26 MED ORDER — LABETALOL HCL 5 MG/ML IV SOLN: 10.0000 mg | INTRAVENOUS | Status: AC | PRN

## 2021-07-26 MED ORDER — TIROFIBAN HCL IN NACL 5-0.9 MG/100ML-% IV SOLN
0.1500 ug/kg/min | INTRAVENOUS | Status: DC
  Administered 2021-07-26 – 2021-07-28 (×6): 0.15 ug/kg/min via INTRAVENOUS
  Filled 2021-07-26 (×6): qty 100

## 2021-07-26 MED ORDER — SODIUM CHLORIDE 0.9% FLUSH: 3.0000 mL | Freq: Two times a day (BID) | INTRAVENOUS | Status: DC

## 2021-07-26 MED ORDER — FENTANYL CITRATE (PF) 100 MCG/2ML IJ SOLN
INTRAMUSCULAR | Status: DC | PRN
  Administered 2021-07-26: 50 ug via INTRAVENOUS

## 2021-07-26 MED ORDER — NITROGLYCERIN IN D5W 200-5 MCG/ML-% IV SOLN
INTRAVENOUS | Status: AC
  Filled 2021-07-26: qty 250

## 2021-07-26 MED ORDER — VERAPAMIL HCL 2.5 MG/ML IV SOLN
INTRAVENOUS | Status: AC
  Filled 2021-07-26: qty 2

## 2021-07-26 MED ORDER — VERAPAMIL HCL 2.5 MG/ML IV SOLN
INTRAVENOUS | Status: DC | PRN
  Administered 2021-07-26: 10 mL via INTRA_ARTERIAL

## 2021-07-26 MED ORDER — HYDRALAZINE HCL 20 MG/ML IJ SOLN: 10.0000 mg | INTRAMUSCULAR | Status: AC | PRN

## 2021-07-26 MED ORDER — HEPARIN (PORCINE) IN NACL 1000-0.9 UT/500ML-% IV SOLN
INTRAVENOUS | Status: AC
  Filled 2021-07-26: qty 1000

## 2021-07-26 MED ORDER — SODIUM CHLORIDE 0.9 % IV SOLN: INTRAVENOUS | Status: AC

## 2021-07-26 MED ORDER — HEPARIN SODIUM (PORCINE) 1000 UNIT/ML IJ SOLN
INTRAMUSCULAR | Status: DC | PRN
  Administered 2021-07-26: 3000 [IU] via INTRAVENOUS
  Administered 2021-07-26 (×2): 4000 [IU] via INTRAVENOUS
  Administered 2021-07-26: 3000 [IU] via INTRAVENOUS

## 2021-07-26 MED ORDER — SODIUM CHLORIDE 0.9% FLUSH
3.0000 mL | Freq: Two times a day (BID) | INTRAVENOUS | Status: DC
  Administered 2021-07-27 – 2021-07-28 (×3): 3 mL via INTRAVENOUS

## 2021-07-26 MED ORDER — LIDOCAINE HCL (PF) 1 % IJ SOLN
INTRAMUSCULAR | Status: DC | PRN
  Administered 2021-07-26 (×2): 2 mL

## 2021-07-26 MED ORDER — ONDANSETRON HCL 4 MG/2ML IJ SOLN: 4.0000 mg | Freq: Four times a day (QID) | INTRAMUSCULAR | Status: DC | PRN

## 2021-07-26 MED ORDER — SODIUM CHLORIDE 0.9 % IV SOLN: 250.0000 mL | INTRAVENOUS | Status: DC | PRN

## 2021-07-26 MED ORDER — CHLORHEXIDINE GLUCONATE CLOTH 2 % EX PADS
6.0000 | MEDICATED_PAD | Freq: Every day | CUTANEOUS | Status: DC
  Administered 2021-07-27: 6 via TOPICAL

## 2021-07-26 MED ORDER — NITROGLYCERIN 1 MG/10 ML FOR IR/CATH LAB
INTRA_ARTERIAL | Status: AC
  Filled 2021-07-26: qty 10

## 2021-07-26 MED ORDER — MIDAZOLAM HCL 2 MG/2ML IJ SOLN
INTRAMUSCULAR | Status: AC
  Filled 2021-07-26: qty 2

## 2021-07-26 MED ORDER — NITROGLYCERIN IN D5W 200-5 MCG/ML-% IV SOLN: 5.0000 ug/min | INTRAVENOUS | Status: DC

## 2021-07-26 MED ORDER — LIDOCAINE HCL (PF) 1 % IJ SOLN
INTRAMUSCULAR | Status: AC
  Filled 2021-07-26: qty 30

## 2021-07-26 MED ORDER — NITROGLYCERIN IN D5W 200-5 MCG/ML-% IV SOLN
INTRAVENOUS | Status: AC | PRN
  Administered 2021-07-26: 20 ug/min via INTRAVENOUS

## 2021-07-26 MED ORDER — SODIUM CHLORIDE 0.9 % WEIGHT BASED INFUSION
3.0000 mL/kg/h | INTRAVENOUS | Status: AC
  Administered 2021-07-26: 3 mL/kg/h via INTRAVENOUS

## 2021-07-26 MED ORDER — CLOPIDOGREL BISULFATE 75 MG PO TABS: 75.0000 mg | ORAL_TABLET | Freq: Every day | ORAL | Status: DC

## 2021-07-26 MED ORDER — HEPARIN SODIUM (PORCINE) 5000 UNIT/ML IJ SOLN
5000.0000 [IU] | Freq: Three times a day (TID) | INTRAMUSCULAR | Status: DC
  Administered 2021-07-27 – 2021-07-28 (×4): 5000 [IU] via SUBCUTANEOUS
  Filled 2021-07-26 (×4): qty 1

## 2021-07-26 MED ORDER — ATORVASTATIN CALCIUM 40 MG PO TABS
40.0000 mg | ORAL_TABLET | Freq: Every evening | ORAL | Status: DC
  Administered 2021-07-26 – 2021-07-27 (×2): 40 mg via ORAL
  Filled 2021-07-26 (×2): qty 1

## 2021-07-26 SURGICAL SUPPLY — 31 items
BALLN SAPPHIRE 1.5X10 (BALLOONS) ×2
BALLOON SAPPHIRE 1.5X10 (BALLOONS) IMPLANT
CATH INFINITI 5 FR 3DRC (CATHETERS) ×1 IMPLANT
CATH INFINITI 5 FR AR1 MOD (CATHETERS) ×1 IMPLANT
CATH INFINITI JR4 5F (CATHETERS) ×1 IMPLANT
CATH LAUNCHER 6FR EBU 3.75 (CATHETERS) ×1 IMPLANT
CATH OPTICROSS HD (CATHETERS) ×1 IMPLANT
CATH OPTITORQUE TIG 4.0 5F (CATHETERS) ×1 IMPLANT
CATH SHOCKWAVE 2.5X12 (CATHETERS) IMPLANT
CATH TELEPORT (CATHETERS) ×1 IMPLANT
CATH VISTA GUIDE 6FR XBLAD3.5 (CATHETERS) ×1 IMPLANT
CATHETER SHOCKWAVE 2.5X12 (CATHETERS) ×2
COVER SWIFTLINK CONNECTOR (BAG) ×1 IMPLANT
DEVICE RAD COMP TR BAND LRG (VASCULAR PRODUCTS) ×1 IMPLANT
GLIDESHEATH SLEND A-KIT 6F 22G (SHEATH) ×1 IMPLANT
GUIDEWIRE INQWIRE 1.5J.035X260 (WIRE) IMPLANT
INQWIRE 1.5J .035X260CM (WIRE) ×6
KIT ENCORE 26 ADVANTAGE (KITS) ×1 IMPLANT
KIT HEART LEFT (KITS) ×2 IMPLANT
KIT HEMO VALVE WATCHDOG (MISCELLANEOUS) ×1 IMPLANT
PACK CARDIAC CATHETERIZATION (CUSTOM PROCEDURE TRAY) ×2 IMPLANT
SHEATH PROBE COVER 6X72 (BAG) ×1 IMPLANT
SLED PULL BACK IVUS (MISCELLANEOUS) ×1 IMPLANT
TRANSDUCER W/STOPCOCK (MISCELLANEOUS) ×2 IMPLANT
TUBING CIL FLEX 10 FLL-RA (TUBING) ×2 IMPLANT
WIRE ASAHI FIELDER XT 190CM (WIRE) ×1 IMPLANT
WIRE ASAHI PROWATER 180CM (WIRE) ×1 IMPLANT
WIRE COUGAR XT STRL 190CM (WIRE) ×1 IMPLANT
WIRE COUGAR XT STRL 300CM (WIRE) ×1 IMPLANT
WIRE GUIDE ASAHI EXTENSION 165 (WIRE) ×1 IMPLANT
WIRE HI TORQ WHISPER MS 190CM (WIRE) ×1 IMPLANT

## 2021-07-26 NOTE — CV Procedure (Signed)
Unsuccessful mid LAD PCI Dissection with partial vessel closure. VF arrest, successful defibrillation within 30 secs. Hemodynamically and electrically stable. Recommend Aspirin/Aggrastat overnight.   Nigel Mormon, MD Pager: [phone] Office: [phone]

## 2021-07-26 NOTE — H&P (Signed)
OV 11/2 copied for documentation     Patient referred by [name], MD for exertional chest pain  Subjective:   Edgar Garcia, male    DOB: 24-Jan-1951, 70 y.o.   MRN: [id number]   Chief Complaint  Patient presents with   Dizziness   Headache   Follow-up     HPI  70 year old male with hypertension, hyperlipidemia, prediabetes, heavy alcohol use, referred for evaluation of exertional chest pain.  Reviewed recent stress test results with the patient, details below. Patient did not tolerate Imdur due to headache and dizziness. Today, he tells me that he did in fact have severe chest pain during the treadmill part of his nuclear stress test.   His colonoscopy for positive cologuard test is pending, but he denies any frank bleeding.   Initial consultation HPI 06/2021: Patient is here with his wife today.  He is a Curator, works in [employer] laboratory technician.  He has had hypertension hyperlipidemia for 9 years, but he was.  Most with a TIA episode.  He since been on lisinopril 40 mg daily, amlodipine 10 mg daily, aspirin 81 mg daily, Crestor 10 mg daily.  He stays active, walking 2-3 miles several days a week.  Recently, he has noted episodes of personal burning sensation occurs at top of the head of the hill, improves with rest.  Episodes occur on a regular basis, and fairly impressive up with the symptoms or physical activity.  Blood pressure is elevated today, but usually much lower than this.  He notices that blood pressure is always elevated physician visits, and much lower at home.  He denies any recurrent TIA, stroke symptoms since his 1 episode of TIA 9 years ago.  He denies any shortness of breath, palpitation, orthopnea, PND symptoms.  On a separate note, he recently tested positive for colitis.  He has an upcoming appointment with gastroenterology next week.  Current Outpatient Medications on File Prior to Visit  Medication Sig Dispense Refill   amLODipine (NORVASC) 5 MG  tablet Take 1 tablet by mouth daily.     aspirin 81 MG EC tablet Take 1 tablet by mouth daily.     atorvastatin (LIPITOR) 10 MG tablet Take 1 tablet by mouth daily at 6 (six) AM.     isosorbide mononitrate (IMDUR) 30 MG 24 hr tablet Take 1 tablet (30 mg total) by mouth daily. 90 tablet 3   lisinopril (PRINIVIL,ZESTRIL) 20 MG tablet Take 1 tablet (20 mg total) by mouth daily. (Patient taking differently: Take 40 mg by mouth daily.) 30 tablet 3   lisinopril (ZESTRIL) 40 MG tablet Take 40 mg by mouth daily.     nitroGLYCERIN (NITROSTAT) 0.4 MG SL tablet Place 1 tablet (0.4 mg total) under the tongue every 5 (five) minutes as needed for chest pain. 90 tablet 3   PEG-KCl-NaCl-NaSulf-Na Asc-C (PLENVU) 140 g SOLR Take 1 kit by mouth as directed. Use coupon: BIN: [id number] PNC: CNRX Group: [id number] ID: [id number] 1 each 0   vitamin B-12 (CYANOCOBALAMIN) 500 MCG tablet Take 1 tablet by mouth daily at 6 (six) AM.     No current facility-administered medications on file prior to visit.    Cardiovascular and other pertinent studies:  Exercise Tetrofosmin stress test 07/12/2021: Exercise nuclear stress test was performed using Bruce protocol. Patient reached 4.6 METS, and 90% of age predicted maximum heart rate. Exercise capacity was low. No chest pain reported, dyspnea and dizziness reported. Heart rate and hemodynamic response were normal. Stress EKG  showed sinus tachycardia, nonspecific T wave inversion leads III, aVF, 1-1.5 mm upsloping ST depression in leads V5, V6 that normalize 1 min into recovery. SPECT images showed small sized, medium intensity, reversible perfusion defect in apical anterior, apical myocardium.  TID 1.23. Stress LVEF 80%.  High risk study due to regional perfusion deficit with TID >1.2.   EKG 06/29/2021: Sinus rhythm 84 bpm baseline artifact   Recent labs: 06/14/2021: Glucose 96, BUN/Cr 7/0.72. EGFR >90. Na/K 143/4.2. Albumin 5.1. Rest of the CMP normal H/H 16/47. MCV 99.7.  Platelets 288 HbA1C 5.5% Chol 164, TG 64, HDL 69, LDL 84 TSH N/A   Review of Systems  Cardiovascular:  Positive for chest pain. Negative for dyspnea on exertion, leg swelling, palpitations and syncope.  Gastrointestinal:        Positive Cologuard test, upcoming visit with gastroenterology.        Vitals:   07/20/21 1411 07/20/21 1414  BP: (!) 156/89 (!) 170/77  Pulse: 79 80  Resp: 16   Temp: 98 F (36.7 C)   SpO2: 97%     Body mass index is 24.97 kg/m. Filed Weights   07/20/21 1411  Weight: 174 lb (78.9 kg)     Objective:   Physical Exam Vitals and nursing note reviewed.  Constitutional:      General: He is not in acute distress. Neck:     Vascular: No JVD.  Cardiovascular:     Rate and Rhythm: Normal rate and regular rhythm.     Heart sounds: Normal heart sounds. No murmur heard. Pulmonary:     Effort: Pulmonary effort is normal.     Breath sounds: Normal breath sounds. No wheezing or rales.         Assessment & Recommendations:    70 year old male with hypertension, hyperlipidemia, prediabetes, heavy alcohol use, referred for evaluation of exertional chest pain.  Exertional chest pain: Symptoms suggestive of angina pectoris.  Continue aspirin 81 mg daily. Regional reversible perfusion defects and TD 1.23 s/o high risk findings.  Did not tolerate Imdur due to headache and dizziness.  Continue amlodipine 5 mg. Added metoprolol succinate 25 mg daily, Increase crestor to 40 mg daily for now. In addition to optimal medical therapy, also recommend coronary angiography and possible coronary intervention given his symptoms and high risk stress test findings.   Schedule for cardiac catheterization, and possible intervention. We discussed regarding risks, benefits, alternatives to this including stress testing, CTA and continued medical therapy. Patient wants to proceed. Understands <1-2% risk of death, stroke, MI, urgent CABG, bleeding, infection, renal failure  but not limited to these.  I am optimistic that we can limit his DAPT to 3-6 months with coronary stenting, after which he can proceed with colonoscopy. Fortunately, he does not have any frank bleeding at this time.  I encouraged him to reduce alcohol intake to reduce gastritis, anticipated that he may be on DAPT in the near future.   Hypertension: Blood pressure elevated, suspect component of whitecoat hypertension.Added metyoprolol.  Thank you for referring the patient to [place]. Please feel free to contact with any questions.   Nigel Mormon, MD Pager: [phone] Office: [phone]

## 2021-07-26 NOTE — Interval H&P Note (Signed)
History and Physical Interval Note:  07/26/2021 12:54 PM  Edgar Garcia  has presented today for surgery, with the diagnosis of chest pain.  The various methods of treatment have been discussed with the patient and family. After consideration of risks, benefits and other options for treatment, the patient has consented to  Procedure(s): LEFT HEART CATH AND CORONARY ANGIOGRAPHY (N/A) as a surgical intervention.  The patient's history has been reviewed, patient examined, no change in status, stable for surgery.  I have reviewed the patient's chart and labs.  Questions were answered to the patient's satisfaction.    2016/2017 Appropriate Use Criteria for Coronary Revascularization Symptom Status: Ischemic Symptoms  Non-invasive Testing: High risk  If no or indeterminate stress test, FFR/iFR results in all diseased vessels: N/A  Diabetes Mellitus: Yes  S/P CABG: No  Antianginal therapy (number of long-acting drugs): >=2  Patient undergoing renal transplant: No  Patient undergoing percutaneous valve procedure: No  1 Vessel Disease PCI CABG  No proximal LAD involvement, No proximal left dominant LCX involvement A (8); Indication 2 M (6); Indication 2  Proximal left dominant LCX involvement A (8); Indication 5 A (8); Indication 5  Proximal LAD involvement A (8); Indication 5 A (8); Indication 5  2 Vessel Disease  No proximal LAD involvement A (8); Indication 8 A (7); Indication 8  Proximal LAD involvement A (8); Indication 14 A (9); Indication 14  3 Vessel Disease  Low disease complexity (e.g., focal stenoses, SYNTAX <=22) A (7); Indication 19 A (9); Indication 19  Intermediate or high disease complexity (e.g., SYNTAX >=23) M (6); Indication 23 A (9); Indication 23  Left Main Disease  Isolated LMCA disease: ostial or midshaft A (7); Indication 24 A (9); Indication 24  Isolated LMCA disease: bifurcation involvement M (6); Indication 25 A (9); Indication 25  LMCA ostial or midshaft, concurrent low  disease burden multivessel disease (e.g., 1-2 additional focal stenoses, SYNTAX <=22) A (7); Indication 26 A (9); Indication 26  LMCA ostial or midshaft, concurrent intermediate or high disease burden multivessel disease (e.g., 1-2 additional bifurcation stenoses, long stenoses, SYNTAX >=23) M (4); Indication 27 A (9); Indication 27  LMCA bifurcation involvement, concurrent low disease burden multivessel disease (e.g., 1-2 additional focal stenoses, SYNTAX <=22) M (6); Indication 28 A (9); Indication 28  LMCA bifurcation involvement, concurrent intermediate or high disease burden multivessel disease (e.g., 1-2 additional bifurcation stenoses, long stenoses, SYNTAX >=23) R (3); Indication 29 A (9); Indication [place]

## 2021-07-26 NOTE — Plan of Care (Signed)

## 2021-07-27 ENCOUNTER — Other Ambulatory Visit (HOSPITAL_COMMUNITY): Payer: Self-pay

## 2021-07-27 ENCOUNTER — Inpatient Hospital Stay (HOSPITAL_COMMUNITY): Payer: Medicare PPO

## 2021-07-27 DIAGNOSIS — I214 Non-ST elevation (NSTEMI) myocardial infarction: Secondary | ICD-10-CM

## 2021-07-27 LAB — ECHOCARDIOGRAM LIMITED
Area-P 1/2: 4.41 cm2
Height: 70.5 in
S' Lateral: 2.71 cm
Single Plane A4C EF: 58.9 %
Weight: 2620.83 oz

## 2021-07-27 LAB — BRAIN NATRIURETIC PEPTIDE: B Natriuretic Peptide: 181.2 pg/mL — ABNORMAL HIGH (ref 0.0–100.0)

## 2021-07-27 LAB — TROPONIN I (HIGH SENSITIVITY)
Troponin I (High Sensitivity): 3798 ng/L (ref <18)
Troponin I (High Sensitivity): 3255 ng/L (ref <18)

## 2021-07-27 LAB — LIPID PANEL
Cholesterol: 118 mg/dL (ref 0–200)
HDL: 50 mg/dL (ref >40)
LDL Cholesterol: 58 mg/dL (ref 0–99)
Total CHOL/HDL Ratio: 2.4 RATIO
Triglycerides: 50 mg/dL (ref <150)
VLDL: 10 mg/dL (ref 0–40)

## 2021-07-27 LAB — BASIC METABOLIC PANEL
Anion gap: 9 (ref 5–15)
BUN: 6 mg/dL — ABNORMAL LOW (ref 8–23)
CO2: 23 mmol/L (ref 22–32)
Calcium: 8.6 mg/dL — ABNORMAL LOW (ref 8.9–10.3)
Chloride: 100 mmol/L (ref 98–111)
Creatinine, Ser: 0.66 mg/dL (ref 0.61–1.24)
GFR, Estimated: >60 mL/min (ref >60)
Glucose, Bld: 98 mg/dL (ref 70–99)
Potassium: 3.6 mmol/L (ref 3.5–5.1)
Sodium: 132 mmol/L — ABNORMAL LOW (ref 135–145)

## 2021-07-27 LAB — CBC
HCT: 42.8 % (ref 39.0–52.0)
Hemoglobin: 15.2 g/dL (ref 13.0–17.0)
MCH: 34.5 pg — ABNORMAL HIGH (ref 26.0–34.0)
MCHC: 35.5 g/dL (ref 30.0–36.0)
MCV: 97.1 fL (ref 80.0–100.0)
Platelets: 278 10*3/uL (ref 150–400)
RBC: 4.41 MIL/uL (ref 4.22–5.81)
RDW: 11.4 % — ABNORMAL LOW (ref 11.5–15.5)
WBC: 8.9 10*3/uL (ref 4.0–10.5)
nRBC: 0 % (ref 0.0–0.2)

## 2021-07-27 LAB — HEMOGLOBIN A1C
Hgb A1c MFr Bld: 5.6 % (ref 4.8–5.6)
Mean Plasma Glucose: 114.02 mg/dL

## 2021-07-27 LAB — MAGNESIUM: Magnesium: 1.9 mg/dL (ref 1.7–2.4)

## 2021-07-27 MED ORDER — TICAGRELOR 90 MG PO TABS
180.0000 mg | ORAL_TABLET | Freq: Once | ORAL | Status: AC
  Administered 2021-07-27: 180 mg via ORAL
  Filled 2021-07-27: qty 2

## 2021-07-27 MED ORDER — AMLODIPINE BESYLATE 5 MG PO TABS
5.0000 mg | ORAL_TABLET | Freq: Every day | ORAL | Status: DC
  Administered 2021-07-27 – 2021-07-28 (×2): 5 mg via ORAL
  Filled 2021-07-27 (×2): qty 1

## 2021-07-27 MED ORDER — ASPIRIN EC 81 MG PO TBEC
81.0000 mg | DELAYED_RELEASE_TABLET | Freq: Every day | ORAL | Status: DC
  Administered 2021-07-27 – 2021-07-28 (×2): 81 mg via ORAL
  Filled 2021-07-27 (×2): qty 1

## 2021-07-27 MED ORDER — METOPROLOL SUCCINATE ER 25 MG PO TB24
25.0000 mg | ORAL_TABLET | Freq: Every day | ORAL | Status: DC
  Administered 2021-07-27 – 2021-07-28 (×2): 25 mg via ORAL
  Filled 2021-07-27 (×2): qty 1

## 2021-07-27 MED ORDER — POTASSIUM CHLORIDE CRYS ER 20 MEQ PO TBCR
40.0000 meq | EXTENDED_RELEASE_TABLET | Freq: Once | ORAL | Status: AC
  Administered 2021-07-27: 40 meq via ORAL
  Filled 2021-07-27: qty 2

## 2021-07-27 MED ORDER — TICAGRELOR 90 MG PO TABS
90.0000 mg | ORAL_TABLET | Freq: Two times a day (BID) | ORAL | Status: DC
  Administered 2021-07-27 – 2021-07-28 (×2): 90 mg via ORAL
  Filled 2021-07-27 (×3): qty 1

## 2021-07-27 MED FILL — Nitroglycerin IV Soln 100 MCG/ML in D5W: INTRA_ARTERIAL | Qty: 10 | Status: AC

## 2021-07-27 NOTE — Progress Notes (Signed)
Subjective:  No retrosternal burning Has epigastric discomfort with certain movement  Patient had one episode of flushing, sinus bradycardia (conformed on telemetry) and hypotension while sitting in recliner. Patient was on 5 mic of nitro at this time. He was in bed since yesterday evening, until this morning. Patient had similar episode when nitro was first started on 07/26/21 at 20 mics. Currently off mic. Denies any dizziness. He had headache with nitro, now improving. Still has shoulder pain.   Objective:  Vital Signs in the last 24 hours: Temp:  [97.8 F (36.6 C)-98.7 F (37.1 C)] 98.2 F (36.8 C) (11/09 0430) Pulse Rate:  [0-93] 83 (11/09 0900) Resp:  [7-48] 19 (11/09 0900) BP: (68-175)/(46-98) 138/87 (11/09 0900) SpO2:  [95 %-100 %] 100 % (11/09 0900) Weight:  [74.3 kg-77.1 kg] 74.3 kg (11/09 0600)  Intake/Output from previous day: 11/08 0701 - 11/09 0700 In: 880.5 [P.O.:240; I.V.:640.5] Out: 1425 [Urine:1425]  Physical Exam Vitals and nursing note reviewed.  Constitutional:      General: He is not in acute distress.    Appearance: He is well-developed.  HENT:     Head: Normocephalic and atraumatic.  Eyes:     Conjunctiva/sclera: Conjunctivae normal.     Pupils: Pupils are equal, round, and reactive to light.  Neck:     Vascular: No JVD.  Cardiovascular:     Rate and Rhythm: Normal rate and regular rhythm.     Pulses: Normal pulses and intact distal pulses.     Heart sounds: No murmur heard. Pulmonary:     Effort: Pulmonary effort is normal.     Breath sounds: Normal breath sounds. No wheezing or rales.  Abdominal:     General: Bowel sounds are normal.     Palpations: Abdomen is soft.     Tenderness: There is no rebound.  Musculoskeletal:        General: No tenderness. Normal range of motion.     Right lower leg: No edema.     Left lower leg: No edema.  Lymphadenopathy:     Cervical: No cervical adenopathy.  Skin:    General: Skin is warm and dry.   Neurological:     Mental Status: He is alert and oriented to person, place, and time.     Cranial Nerves: No cranial nerve deficit.     Lab Results: BMP Recent Labs    07/21/21 1341 07/26/21 2109 07/27/21 0512  NA 135  --  132*  K 5.0  --  3.6  CL 95*  --  100  CO2 26  --  23  GLUCOSE 106*  --  98  BUN 8  --  6*  CREATININE 0.78 0.71 0.66  CALCIUM 9.5  --  8.6*  GFRNONAA  --  >60 >60    CBC Recent Labs  Lab 07/27/21 0512  WBC 8.9  RBC 4.41  HGB 15.2  HCT 42.8  PLT 278  MCV 97.1  MCH 34.5*  MCHC 35.5  RDW 11.4*    HEMOGLOBIN A1C No results found for: HGBA1C, MPG  Cardiac Panel (last 3 results) Results for ISMAR, YABUT (MRN [id number]) as of 07/27/2021 09:17  Ref. Range 07/27/2021 05:12 07/27/2021 07:07 07/27/2021 07:39  Troponin I (High Sensitivity) Latest Ref Range: <18 ng/L 3,798 [facility])  3,255 [facility])    Imaging: Chest Xray pending  Cardiac Studies:  EKG 07/27/2021: Sinus rhythm Anterolateral ischemia  Coronary angiogram 07/26/2021: LM: Normal LAD: Prox 70-75% severe calcific disease  Mid LAD focal 95% stenosis Lcx: Dominant. No significant disease RCA: Non-ominant. Proximal 90% stenosis   Unsuccessful percutaneous coronary intervention mid LAD Dissection and partial vessel closure Ventricular fibrillation requiring emergent defibrillation Patient hemodynamically and electrically stable   Monitor in ICU overnight. Recommend Aspirin/Aggrastat. Provided mo clinical deterioration overnight requiring emergency CABG (less likely), will load with Brilint ain AM.    Will consider repeat angiography and intervention in 3-4 weeks after healing of dissection.    Echocardiogram 07/11/2021:  Left ventricle cavity is normal in size. Mild concentric hypertrophy of  the left ventricle. Normal global wall motion. Normal LV systolic function  with EF 65%. Normal diastolic filling pattern.  Trileaflet aortic valve. No evidence of aortic stenosis.  Mild  aortic  valve leaflet calcification. Mild (Grade I) aortic regurgitation.  Moderate tricuspid regurgitation. Estimated pulmonary artery systolic  pressure 29 mmHg.  Assessment & Recommendations:  70 year old male with hypertension, hyperlipidemia, prediabetes, heavy alcohol use, now with preiprocedural MI  Periprocedural MI/NSTEMI: Currently chest pain free. Peak trop HS modestly elevated at 3700. Hemodynamically and electrically stable. Dissection with partial closure of mid to distal LAD. Continue Aspirin, lipitor 40 mg. Resume amlodipine 5 mg, metoprolol succinate 25 mg with cautious monitoring of heart rate.  Not on nitro given headache and possible vasovagal response X2. Will consider repeat angiography and possible intervention 11/10.  Arrhthymias: Intraprocedural Vfib arrest, successfully cardioverted on cath table 07/26/2021. 6-7 beat NSVT ovrnight. One episode of sinus bradycardia in 30s, likely a vasovagal event.  Continue telemetry monitoring.   Hypertension: Resume amlodipine and metoprolol. Hold lisinopril given contrast use.  Hyperlipidemia: Check lipid panel   CRITICAL CARE Performed by: Vernell Leep   Total critical care time: 40 minutes   Critical care time was exclusive of separately billable procedures and treating other patients.   Critical care was necessary to treat or prevent imminent or life-threatening deterioration.   Critical care was time spent personally by me on the following activities: development of treatment plan with patient and/or surrogate as well as nursing, discussions with consultants, evaluation of patient's response to treatment, examination of patient, obtaining history from patient or surrogate, ordering and performing treatments and interventions, ordering and review of laboratory studies, ordering and review of radiographic studies, pulse oximetry and re-evaluation of patient's condition.       Nigel Mormon,  MD Pager: [phone] Office: [phone]

## 2021-07-27 NOTE — Progress Notes (Signed)
  Echocardiogram 2D Echocardiogram has been performed.  Edgar Garcia 07/27/2021, 9:05 AM

## 2021-07-27 NOTE — Progress Notes (Signed)
Approx 0600, up to recliner, gait steady, denies CP/Pressure with exertion, slight epigastric discomfort remains but unchanged with exertion, R radial access site level 1/stable (small bruising, no hematoma). HR/BP stable, on NTG gtt @ 15.   Sitting up in recliner for approx 25 min when pt rang callbell and stated he felt dizzy.   Diaphoretic, Bradycardic 40-50s, BP checked 68/46. NTG gtt stopped. Pt oriented, denies chest pain, epigastric discomfort unchanged. Recliner laid flat, HR and BP improved quickly, no longer diaphoretic.   Lifted back into bed with assist x 3. HR/BP stable, NTG remains off. Reinforced importance of notifying RN immediately with any repeat episodes of dizziness, any chest pain/changes in epigastric sensations. Call  bell in reach.

## 2021-07-27 NOTE — TOC Benefit Eligibility Note (Signed)
Patient Teacher, English as a foreign language completed.    The patient is currently admitted and upon discharge could be taking Brilinta 90 mg.  The current 30 day co-pay is, $302.25 due to a $255.25 deductible remaining.   The patient is insured through [place] [place] Patient Advocate Specialist [place] Patient Advocate Team Direct Number: [phone]  Fax: [phone]

## 2021-07-28 ENCOUNTER — Other Ambulatory Visit (HOSPITAL_COMMUNITY): Payer: Self-pay

## 2021-07-28 ENCOUNTER — Other Ambulatory Visit: Payer: Self-pay | Admitting: Cardiology

## 2021-07-28 DIAGNOSIS — I209 Angina pectoris, unspecified: Secondary | ICD-10-CM

## 2021-07-28 LAB — BASIC METABOLIC PANEL
Anion gap: 7 (ref 5–15)
BUN: 6 mg/dL — ABNORMAL LOW (ref 8–23)
CO2: 23 mmol/L (ref 22–32)
Calcium: 8.4 mg/dL — ABNORMAL LOW (ref 8.9–10.3)
Chloride: 99 mmol/L (ref 98–111)
Creatinine, Ser: 0.62 mg/dL (ref 0.61–1.24)
GFR, Estimated: >60 mL/min (ref >60)
Glucose, Bld: 108 mg/dL — ABNORMAL HIGH (ref 70–99)
Potassium: 3.6 mmol/L (ref 3.5–5.1)
Sodium: 129 mmol/L — ABNORMAL LOW (ref 135–145)

## 2021-07-28 LAB — SODIUM, URINE, RANDOM: Sodium, Ur: 51 mmol/L

## 2021-07-28 LAB — OSMOLALITY: Osmolality: 283 mOsm/kg (ref 275–295)

## 2021-07-28 MED ORDER — LISINOPRIL 40 MG PO TABS: 20.0000 mg | ORAL_TABLET | Freq: Every morning | ORAL | Status: DC

## 2021-07-28 MED ORDER — TICAGRELOR 90 MG PO TABS
90.0000 mg | ORAL_TABLET | Freq: Two times a day (BID) | ORAL | 2 refills | Status: DC
  Filled 2021-07-28: qty 60, 30d supply, fill #0

## 2021-07-28 MED ORDER — ATORVASTATIN CALCIUM 40 MG PO TABS
40.0000 mg | ORAL_TABLET | Freq: Every evening | ORAL | 2 refills | Status: DC
  Filled 2021-07-28: qty 30, 30d supply, fill #0

## 2021-07-28 NOTE — Discharge Summary (Signed)
Physician Discharge Summary  Patient ID: Edgar Garcia MRN: [id number] DOB/AGE: 09/18/1951 70 y.o.  Admit date: 07/26/2021 Discharge date: 07/28/2021  Primary Discharge Diagnosis: CAD NSTEMI- periprocedural MI  Secondary Discharge Diagnosis: Hypertension Hyperlipidemia   Hospital Course:   70 year old male with hypertension, hyperlipidemia, prediabetes, heavy alcohol use, referred for evaluation of exertional chest pain, high risk stress test with mid to distal LAD ischemia ant TID 1.3. Patient is on two anti anginal agents with continued angina symptoms   Patient had very complex and challenging anatomy in his mid LAD. Unfortunately, in attempt to cross the lesion, he had dissection with cessation of mid to distal LAD flow-associated with angina symptoms. I was able to partially reestablish flow in distal LAD.  Given dissection, chances of successful PCI are low at this time. No emergent need for CABG.  Patient was treated with aspirin and tirofiban, followed by addition of Brilinta.  Patient did have elevation in his troponin to of 3700, with subsequent resolution of chest pain the same night.  Echocardiogram showed preserved EF with apical and anteroapical hypokinesis.  BNP was mildly elevated without any other evidence of congestive heart failure.  Rest of the hospital stay was uneventful barring occasional vasovagal episode and 6 beat nonsustained VT.  Patient did not have any chest pain at rest or with ambulation in the hallway.  He was discharged on dual antiplatelet therapy, and antianginal therapy including amlodipine and metoprolol succinate.  I avoided starting Ranexa given prolonged QT.  Subsequent EKGs showed deep T wave inversions reflective of myocardial injury, without any ongoing chest pain.  After detailed discussion with patient and wife, we decided to attempt outpatient PCI to mid to distal LAD later this month. If unfavorable anatomy or unsuccessful PCI, will consider surgical  consultation.  On a separate note, patient was noted to have hyponatremia of 129, with normal serum muscularity and urine sodium.  Patient is not having any symptoms related to hyponatremia.  I have ordered repeat blood work for next week which I will discuss on upcoming office visit.  Tentatively scheduled for PCI on 11/29.  Discharge Exam: Blood pressure 132/85, pulse 67, temperature 99.1 F (37.3 C), temperature source Oral, resp. rate 14, height 5' 10.5" (1.791 m), weight 74.3 kg, SpO2 99 %.   Physical Exam Vitals and nursing note reviewed.  Constitutional:      General: He is not in acute distress.    Appearance: He is well-developed.  HENT:     Head: Normocephalic and atraumatic.  Eyes:     Conjunctiva/sclera: Conjunctivae normal.     Pupils: Pupils are equal, round, and reactive to light.  Neck:     Vascular: No JVD.  Cardiovascular:     Rate and Rhythm: Normal rate and regular rhythm.     Pulses: Normal pulses and intact distal pulses.     Heart sounds: No murmur heard. Pulmonary:     Effort: Pulmonary effort is normal.     Breath sounds: Normal breath sounds. No wheezing or rales.  Abdominal:     General: Bowel sounds are normal.     Palpations: Abdomen is soft.     Tenderness: There is no rebound.  Musculoskeletal:        General: No tenderness. Normal range of motion.     Right lower leg: No edema.     Left lower leg: No edema.  Lymphadenopathy:     Cervical: No cervical adenopathy.  Skin:    General: Skin is warm  and dry.  Neurological:     Mental Status: He is alert and oriented to person, place, and time.     Cranial Nerves: No cranial nerve deficit.      Recommendations on discharge:  Avoid physical exertion   Significant Diagnostic Studies:  EKG 07/28/2021: Sinus rhythm. Anterolateral T wave inversion, consider ischemia    Labs:   Lab Results  Component Value Date   WBC 8.9 07/27/2021   HGB 15.2 07/27/2021   HCT 42.8 07/27/2021   MCV  97.1 07/27/2021   PLT 278 07/27/2021    Recent Labs  Lab 07/28/21 0124  NA 129*  K 3.6  CL 99  CO2 23  BUN 6*  CREATININE 0.62  CALCIUM 8.4*  GLUCOSE 108*    Lipid Panel     Component Value Date/Time   CHOL 118 07/27/2021 0512   TRIG 50 07/27/2021 0512   HDL 50 07/27/2021 0512   CHOLHDL 2.4 07/27/2021 0512   VLDL 10 07/27/2021 0512   LDLCALC 58 07/27/2021 0512    BNP (last 3 results) Recent Labs    07/27/21 0512  BNP 181.2*    HEMOGLOBIN A1C Lab Results  Component Value Date   HGBA1C 5.6 07/27/2021   MPG 114.02 07/27/2021    Cardiac Panel (last 3 results) No results for input(s): CKTOTAL, CKMB, TROPONINI, RELINDX in the last 8760 hours.  Lab Results  Component Value Date   CKTOTAL 66 03/27/2012   CKMB 2.2 03/27/2012     TSH No results for input(s): TSH in the last 8760 hours.  Radiology: CARDIAC CATHETERIZATION  Result Date: 07/26/2021 Images from the original result were not included. LM: Normal LAD: Prox 70-75% severe calcific disease         Mid LAD focal 95% stenosis Lcx: Dominant. No significant disease RCA: Non-ominant. Proximal 90% stenosis Unsuccessful percutaneous coronary intervention mid LAD Dissection and partial vessel closure Ventricular fibrillation requiring emergent defibrillation Patient hemodynamically and electrically stable Monitor in ICU overnight. Recommend Aspirin/Aggrastat. Provided mo clinical deterioration overnight requiring emergency CABG (less likely), will load with Brilint ain AM. Will consider repeat angiography and intervention in 3-4 weeks after healing of dissection. Nigel Mormon, MD Pager: [phone] Office: [phone]  DG CHEST PORT 1 VIEW  Result Date: 07/27/2021 CLINICAL DATA:  Chest pain EXAM: PORTABLE CHEST 1 VIEW COMPARISON:  03/27/2012 FINDINGS: Cardiac size is within normal limits. There are no signs of pulmonary edema or focal pulmonary consolidation. There is no significant pleural effusion or  pneumothorax. IMPRESSION: No active disease. Electronically Signed   By: Elmer Picker M.D.   On: 07/27/2021 11:12   ECHOCARDIOGRAM LIMITED  Result Date: 07/27/2021    ECHOCARDIOGRAM LIMITED REPORT   Patient Name:   Edgar Garcia Date of Exam: 07/27/2021 Medical Rec #:  [id number]    Height:       70.5 in Accession #:    [id number]   Weight:       163.8 lb Date of Birth:  1951-09-18     BSA:          1.928 m Patient Age:    60 years     BP:           138/87 mmHg Patient Gender: M            HR:           86 bpm. Exam Location:  Inpatient Procedure: Limited Echo, Limited Color Doppler and Cardiac Doppler Indications:     chest pain  History:         Patient has prior history of Echocardiogram examinations, most                  recent 07/11/2021.  Sonographer:     Johny Chess RDCS Referring Phys:  [id number] [facility] Emalea Mix Diagnosing Phys: Vernell Leep MD IMPRESSIONS  1. Left ventricular ejection fraction, by estimation, is 65 to 70%. The left ventricle has normal function. The left ventricle demonstrates regional wall motion abnormalities-mild hypokinesis in apical/anterior apical myocardium. Left ventricular diastolic parameters are consistent with Grade I diastolic dysfunction (impaired relaxation). These changes are new compared previous outpatient study in 06/2021.  2. There is mildly elevated pulmonary artery systolic pressure at 39 mmHg.  3. The inferior vena cava is normal in size with greater than 50% respiratory variability, suggesting right atrial pressure of 3 mmHg. FINDINGS  Left Ventricle: Left ventricular ejection fraction, by estimation, is 65 to 70%. The left ventricle has normal function. The left ventricle demonstrates regional wall motion abnormalities. Mild hypokinesis of the left ventricular, apical anterior wall. There is no left ventricular hypertrophy. Left ventricular diastolic parameters are consistent with Grade I diastolic dysfunction (impaired relaxation). Right  Ventricle: There is mildly elevated pulmonary artery systolic pressure. The tricuspid regurgitant velocity is 3.01 m/s, and with an assumed right atrial pressure of 3 mmHg, the estimated right ventricular systolic pressure is 02.6 mmHg. Tricuspid Valve: Tricuspid valve regurgitation is mild. Venous: The inferior vena cava is normal in size with greater than 50% respiratory variability, suggesting right atrial pressure of 3 mmHg. LEFT VENTRICLE PLAX 2D LVIDd:         4.53 cm     Diastology LVIDs:         2.71 cm     LV e' medial:    6.85 cm/s LV PW:         0.83 cm     LV E/e' medial:  9.9 LV IVS:        0.80 cm     LV e' lateral:   8.70 cm/s                            LV E/e' lateral: 7.8  LV Volumes (MOD) LV vol d, MOD A4C: 77.9 ml LV vol s, MOD A4C: 32.0 ml LV SV MOD A4C:     77.9 ml AORTIC VALVE LVOT Vmax:   99.40 cm/s LVOT Vmean:  63.400 cm/s LVOT VTI:    0.200 m  AORTA Ao Asc diam: 3.30 cm MITRAL VALVE                TRICUSPID VALVE MV Area (PHT): 4.41 cm     TR Peak grad:   36.2 mmHg MV Decel Time: 172 msec     TR Vmax:        301.00 cm/s MV E velocity: 68.10 cm/s MV A velocity: 102.00 cm/s  SHUNTS MV E/A ratio:  0.67         Systemic VTI: 0.20 m Vernell Leep MD Electronically signed by Vernell Leep MD Signature Date/Time: 07/27/2021/9:11:02 AM    Final    PCV ECHOCARDIOGRAM COMPLETE  Result Date: 07/14/2021 Echocardiogram 07/11/2021: Left ventricle cavity is normal in size. Mild concentric hypertrophy of the left ventricle. Normal global wall motion. Normal LV systolic function with EF 65%. Normal diastolic filling pattern. Trileaflet aortic valve. No evidence of aortic stenosis.  Mild aortic valve leaflet calcification. Mild (Grade  I) aortic regurgitation. Moderate tricuspid regurgitation. Estimated pulmonary artery systolic pressure 29 mmHg.  PCV MYOCARDIAL PERFUSION WO LEXISCAN  Addendum Date: 07/20/2021   Exercise Tetrofosmin stress test 07/12/2021: Exercise nuclear stress test was  performed using Bruce protocol. Patient reached 4.6 METS, and 90% of age predicted maximum heart rate. Exercise capacity was low. No chest pain reported, dyspnea and dizziness reported. Heart rate and hemodynamic response were normal. Stress EKG showed sinus tachycardia, nonspecific T wave inversion leads III, aVF, 1-1.5 mm upsloping ST depression in leads V5, V6 that normalize 1 min into recovery. SPECT images showed small sized, medium intensity, reversible perfusion defect in apical anterior, apical myocardium.  TID 1.23. Stress LVEF 80%. High risk study due to regional perfusion deficit with TID >1.2.   Addendum Date: 07/20/2021     1.5 mm of ST depression (V5 and V6) was noted. Exercise Tetrofosmin stress test 07/12/2021: Exercise nuclear stress test was performed using Bruce protocol. Patient reached 4.6 METS, and 90% of age predicted maximum heart rate. Exercise capacity was low. No chest pain reported, dyspnea and dizziness reported. Heart rate and hemodynamic response were normal. Stress EKG showed sinus tachycardia, nonspecific T wave inversion leads III, aVF, 1-1.5 mm upsloping ST depression in leads V5, V6 that normalize 1 min into recovery. TID 1.23. Stress LVEF 80%. High risk study due to regional perfusion deficit with TID >1.2.   Addendum Date: 07/14/2021     ST elevation and 1.5 mm of ST depression (V5 and V6) was noted. Exercise Tetrofosmin stress test 07/12/2021: Exercise nuclear stress test was performed using Bruce protocol. Patient reached 4.6 METS, and 90% of age predicted maximum heart rate. Exercise capacity was low. No chest pain reported, dyspnea and dizziness reported. Heart rate and hemodynamic response were normal. Stress EKG showed sinus tachycardia, nonspecific T wave inversion leads III, aVF, 1-1.5 mm upsloping ST depression in leads V5, V6 that normalize 1 min into recovery. TID 1.23. Stress LVEF 80%. High risk study due to regional perfusion deficit with TID >1.2.   Result  Date: 07/20/2021 Exercise Tetrofosmin stress test 07/12/2021: Exercise nuclear stress test was performed using Bruce protocol. Patient reached 4.6 METS, and 90% of age predicted maximum heart rate. Exercise capacity was low. No chest pain reported, dyspnea and dizziness reported. Heart rate and hemodynamic response were normal. Stress EKG showed sinus tachycardia, nonspecific T wave inversion leads III, aVF, 1-1.5 mm upsloping ST depression in leads V5, V6 that normalize 1 min into recovery. TID 1.23. Stress LVEF 80%. High risk study due to regional perfusion deficit with TID >1.2.      FOLLOW UP PLANS AND APPOINTMENTS Discharge Instructions     Amb Referral to Cardiac Rehabilitation   Complete by: As directed    Diagnosis:  NSTEMI PTCA     After initial evaluation and assessments completed: Virtual Based Care may be provided alone or in conjunction with Phase 2 Cardiac Rehab based on patient barriers.: Yes   Diet - low sodium heart healthy   Complete by: As directed    Increase activity slowly   Complete by: As directed       Allergies as of 07/28/2021   No Known Allergies      Medication List     TAKE these medications    amLODipine 5 MG tablet Commonly known as: NORVASC Take 5 mg by mouth in the morning.   aspirin 81 MG EC tablet Take 81 mg by mouth in the morning.   atorvastatin 40 MG tablet  Commonly known as: LIPITOR Take 1 tablet (40 mg total) by mouth every evening. What changed:  medication strength how much to take   Brilinta 90 MG Tabs tablet Generic drug: ticagrelor Take 1 tablet (90 mg total) by mouth 2 (two) times daily.   lisinopril 40 MG tablet Commonly known as: ZESTRIL Take 0.5 tablets (20 mg total) by mouth in the morning. What changed: how much to take   metoprolol succinate 25 MG 24 hr tablet Commonly known as: TOPROL-XL Take 1 tablet (25 mg total) by mouth daily. Take with or immediately following a meal.   nitroGLYCERIN 0.4 MG SL  tablet Commonly known as: NITROSTAT Place 1 tablet (0.4 mg total) under the tongue every 5 (five) minutes as needed for chest pain.   Plenvu 140 g Solr Generic drug: PEG-KCl-NaCl-NaSulf-Na Asc-C Take 1 kit by mouth as directed. Use coupon: BIN: 720910 PNC: CNRX Group: [id number] ID: [id number]   vitamin B-12 1000 MCG tablet Commonly known as: CYANOCOBALAMIN Take 1,000 mcg by mouth in the morning.        Follow-up Information     Nigel Mormon, MD Follow up on 08/04/2021.   Specialties: Cardiology, Radiology Why: 10:00 AM Contact information: [place] [address] [phone]                   Nigel Mormon, MD Pager: [phone] Office: [phone]

## 2021-07-28 NOTE — Addendum Note (Signed)
Addended by: Nigel Mormon on: 07/28/2021 12:04 PM   Modules accepted: Orders

## 2021-07-28 NOTE — Progress Notes (Signed)
Patient given discharge instructions and medications from [facility]. Patient verbalizes understanding. No complaints at the current time. Will discharge via [place].

## 2021-07-28 NOTE — Progress Notes (Signed)
CARDIAC REHAB PHASE I   Offered to walk with pt. Pt states ambulation with RN without difficulty. Denies CP, SOB, or dizziness. Pt educated on importance of ASA and Brilinta. Pt given MI book along with heart healthy and diabetic diets. Reviewed site care and restrictions. Encouraged some ambulation with emphasis on monitoring symptoms. Referred to [facility] [place] to meet the requirement but pt will not be able to attend until staged intervention.  [id number] Rufina Falco, RN BSN 07/28/2021 11:37 AM

## 2021-07-29 SURGERY — LEFT HEART CATH AND CORONARY ANGIOGRAPHY
Anesthesia: LOCAL

## 2021-07-30 LAB — CBC
Hematocrit: 44.8 % (ref 37.5–51.0)
Hemoglobin: 15.8 g/dL (ref 13.0–17.7)
MCH: 33.6 pg — ABNORMAL HIGH (ref 26.6–33.0)
MCHC: 35.3 g/dL (ref 31.5–35.7)
MCV: 95 fL (ref 79–97)
Platelets: 330 10*3/uL (ref 150–450)
RBC: 4.7 x10E6/uL (ref 4.14–5.80)
RDW: 10.8 % — ABNORMAL LOW (ref 11.6–15.4)
WBC: 8.5 10*3/uL (ref 3.4–10.8)

## 2021-07-30 LAB — BASIC METABOLIC PANEL
BUN/Creatinine Ratio: 9 — ABNORMAL LOW (ref 10–24)
BUN: 7 mg/dL — ABNORMAL LOW (ref 8–27)
CO2: 22 mmol/L (ref 20–29)
Calcium: 9.3 mg/dL (ref 8.6–10.2)
Chloride: 94 mmol/L — ABNORMAL LOW (ref 96–106)
Creatinine, Ser: 0.78 mg/dL (ref 0.76–1.27)
Glucose: 127 mg/dL — ABNORMAL HIGH (ref 70–99)
Potassium: 3.9 mmol/L (ref 3.5–5.2)
Sodium: 134 mmol/L (ref 134–144)
eGFR: 95 mL/min/{1.73_m2} (ref >59)

## 2021-07-30 LAB — BRAIN NATRIURETIC PEPTIDE: BNP: 109.8 pg/mL — ABNORMAL HIGH (ref 0.0–100.0)

## 2021-08-01 ENCOUNTER — Encounter: Payer: Medicare PPO | Admitting: Gastroenterology

## 2021-08-01 NOTE — Progress Notes (Signed)
Called and spoke to pt, pt voiced understanding.

## 2021-08-01 NOTE — Progress Notes (Signed)
Called pt no answer, left a vm

## 2021-08-02 ENCOUNTER — Telehealth (HOSPITAL_COMMUNITY): Payer: Self-pay

## 2021-08-02 NOTE — Telephone Encounter (Signed)
Per phase I, "Referred to [facility] [place] to meet the requirement but pt will not be able to attend until staged intervention."   Closed referral

## 2021-08-04 ENCOUNTER — Other Ambulatory Visit: Payer: Self-pay

## 2021-08-04 ENCOUNTER — Ambulatory Visit: Payer: Medicare PPO | Admitting: Cardiology

## 2021-08-04 ENCOUNTER — Encounter: Payer: Self-pay | Admitting: Cardiology

## 2021-08-04 VITALS — Resp 16 | Ht 70.5 in | Wt 169.0 lb

## 2021-08-04 DIAGNOSIS — E782 Mixed hyperlipidemia: Secondary | ICD-10-CM

## 2021-08-04 DIAGNOSIS — I10 Essential (primary) hypertension: Secondary | ICD-10-CM

## 2021-08-04 DIAGNOSIS — I25118 Atherosclerotic heart disease of native coronary artery with other forms of angina pectoris: Secondary | ICD-10-CM

## 2021-08-04 NOTE — Progress Notes (Signed)
Patient referred by [name], MD for exertional chest pain  Subjective:   Edgar Garcia, male    DOB: 19-Mar-1951, 70 y.o.   MRN: [id number]   Chief Complaint  Patient presents with   Chest Pain   Follow-up    cath   Dizziness     HPI  70 year old male with hypertension, hyperlipidemia, prediabetes, prior h/o heavy alcohol use, now with CAD with CCS III angina.  Patient underwent coronary angiography on 07/26/2021. Please see discharge summary below.  He has continued to have lower chest/upper epigastric discomfort. He has significantly reduced his physical activity. His colonoscopy for positive cologuard test is pending, but he denies any frank bleeding.   Discharge summary 07/28/2021: Patient had very complex and challenging anatomy in his mid LAD. Unfortunately, in attempt to cross the lesion, he had dissection with cessation of mid to distal LAD flow-associated with angina symptoms. I was able to partially reestablish flow in distal LAD.  Given dissection, chances of successful PCI are low at this time. No emergent need for CABG.  Patient was treated with aspirin and tirofiban, followed by addition of Brilinta.  Patient did have elevation in his troponin to of 3700, with subsequent resolution of chest pain the same night.  Echocardiogram showed preserved EF with apical and anteroapical hypokinesis.  BNP was mildly elevated without any other evidence of congestive heart failure.  Rest of the hospital stay was uneventful barring occasional vasovagal episode and 6 beat nonsustained VT.  Patient did not have any chest pain at rest or with ambulation in the hallway.  He was discharged on dual antiplatelet therapy, and antianginal therapy including amlodipine and metoprolol succinate.  I avoided starting Ranexa given prolonged QT.  Subsequent EKGs showed deep T wave inversions reflective of myocardial injury, without any ongoing chest pain.   After detailed discussion with patient  and wife, we decided to attempt outpatient PCI to mid to distal LAD later this month. If unfavorable anatomy or unsuccessful PCI, will consider surgical consultation.   On a separate note, patient was noted to have hyponatremia of 129, with normal serum muscularity and urine sodium.  Patient is not having any symptoms related to hyponatremia.  I have ordered repeat blood work for next week which I will discuss on upcoming office visit.  Tentatively scheduled for PCI on 11/29.    Initial consultation HPI 06/2021: Patient is here with his wife today.  He is a Curator, works in Scientific laboratory technician.  He has had hypertension hyperlipidemia for 9 years, but he was.  Most with a TIA episode.  He since been on lisinopril 40 mg daily, amlodipine 10 mg daily, aspirin 81 mg daily, Crestor 10 mg daily.  He stays active, walking 2-3 miles several days a week.  Recently, he has noted episodes of personal burning sensation occurs at top of the head of the hill, improves with rest.  Episodes occur on a regular basis, and fairly impressive up with the symptoms or physical activity.  Blood pressure is elevated today, but usually much lower than this.  He notices that blood pressure is always elevated physician visits, and much lower at home.  He denies any recurrent TIA, stroke symptoms since his 1 episode of TIA 9 years ago.  He denies any shortness of breath, palpitation, orthopnea, PND symptoms.  On a separate note, he recently tested positive for colitis.  He has an upcoming appointment with gastroenterology next week.  Current Outpatient Medications  on File Prior to Visit  Medication Sig Dispense Refill   amLODipine (NORVASC) 5 MG tablet Take 5 mg by mouth in the morning.     aspirin 81 MG EC tablet Take 81 mg by mouth in the morning.     atorvastatin (LIPITOR) 40 MG tablet Take 1 tablet (40 mg total) by mouth every evening. 30 tablet 2   lisinopril (ZESTRIL) 40 MG tablet Take 0.5 tablets (20 mg total) by  mouth in the morning.     metoprolol succinate (TOPROL-XL) 25 MG 24 hr tablet Take 1 tablet (25 mg total) by mouth daily. Take with or immediately following a meal. 30 tablet 3   nitroGLYCERIN (NITROSTAT) 0.4 MG SL tablet Place 1 tablet (0.4 mg total) under the tongue every 5 (five) minutes as needed for chest pain. 90 tablet 3   ticagrelor (BRILINTA) 90 MG TABS tablet Take 1 tablet (90 mg total) by mouth 2 (two) times daily. 60 tablet 2   vitamin B-12 (CYANOCOBALAMIN) 1000 MCG tablet Take 1,000 mcg by mouth in the morning.     No current facility-administered medications on file prior to visit.    Cardiovascular and other pertinent studies:  EKG 07/28/2021: Normal sinus rhythm Marked T wave abnormality, consider anterolateral ischemia Prolonged QT Abnormal ECG  Echocardiogram 07/27/2021:  1. Left ventricular ejection fraction, by estimation, is 65 to 70%. The  left ventricle has normal function. The left ventricle demonstrates  regional wall motion abnormalities-mild hypokinesis in apical/anterior  apical myocardium. Left ventricular  diastolic parameters are consistent with Grade I diastolic dysfunction  (impaired relaxation). These changes are new compared previous outpatient  study in 06/2021.   2. There is mildly elevated pulmonary artery systolic pressure at 39  mmHg.   3. The inferior vena cava is normal in size with greater than 50%  respiratory variability, suggesting right atrial pressure of 3 mmHg.   Coronary intervention 07/26/2021: LM: Normal LAD: Prox 70-75% severe calcific disease         Mid LAD focal 95% stenosis Lcx: Dominant. No significant disease RCA: Non-ominant. Proximal 90% stenosis   Unsuccessful percutaneous coronary intervention mid LAD Dissection and partial vessel closure Ventricular fibrillation requiring emergent defibrillation Patient hemodynamically and electrically stable   Monitor in ICU overnight. Recommend Aspirin/Aggrastat. Provided mo  clinical deterioration overnight requiring emergency CABG (less likely), will load with Brilint ain AM.    Will consider repeat angiography and intervention in 3-4 weeks after healing of dissection.  Exercise Tetrofosmin stress test 07/12/2021: Exercise nuclear stress test was performed using Bruce protocol. Patient reached 4.6 METS, and 90% of age predicted maximum heart rate. Exercise capacity was low. No chest pain reported, dyspnea and dizziness reported. Heart rate and hemodynamic response were normal. Stress EKG showed sinus tachycardia, nonspecific T wave inversion leads III, aVF, 1-1.5 mm upsloping ST depression in leads V5, V6 that normalize 1 min into recovery. SPECT images showed small sized, medium intensity, reversible perfusion defect in apical anterior, apical myocardium.  TID 1.23. Stress LVEF 80%.  High risk study due to regional perfusion deficit with TID >1.2.   EKG 06/29/2021: Sinus rhythm 84 bpm baseline artifact   Recent labs: 06/14/2021: Glucose 96, BUN/Cr 7/0.72. EGFR >90. Na/K 143/4.2. Albumin 5.1. Rest of the CMP normal H/H 16/47. MCV 99.7. Platelets 288 HbA1C 5.5% Chol 164, TG 64, HDL 69, LDL 84 TSH N/A   Review of Systems  Cardiovascular:  Positive for chest pain. Negative for dyspnea on exertion, leg swelling, palpitations and syncope.  Musculoskeletal:        Shoulder discomfort  Gastrointestinal:        Positive Cologuard test, upcoming visit with gastroenterology.        Vitals:   08/04/21 1012 08/04/21 1013  Resp:    SpO2: 98% 98%    Body mass index is 23.91 kg/m. Filed Weights   08/04/21 1011  Weight: 169 lb (76.7 kg)   Orthostatic VS for the past 72 hrs (Last 3 readings):  Orthostatic BP Patient Position BP Location Cuff Size Orthostatic Pulse  08/04/21 1013 145/86 Standing Left Arm Normal 70  08/04/21 1012 (!) 157/91 Sitting Left Arm Normal 71  08/04/21 1011 144/85 Supine Left Arm Normal 79     Objective:   Physical  Exam Vitals and nursing note reviewed.  Constitutional:      General: He is not in acute distress. Neck:     Vascular: No JVD.  Cardiovascular:     Rate and Rhythm: Normal rate and regular rhythm.     Heart sounds: Normal heart sounds. No murmur heard. Pulmonary:     Effort: Pulmonary effort is normal.     Breath sounds: Normal breath sounds. No wheezing or rales.  Musculoskeletal:     Right lower leg: No edema.     Left lower leg: No edema.         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  CAD: Severe mid LAD, moderate prox LAD disease. Wire dissection causing periprocedural MI. He remains symptomatic with CAD, which also limits his physical activity.  Mildly elevated BNP without any heart failure signs/symptoms. He is on two anti anginal agents, further addition of agents limited due to low normal blood pressure at home.   We dicussed the following options.  Continued medical management Repeat coronary angiography with intervention Consider CABG.  With three weeks since since 07/26/2021, I am hopeful that dissection would have healed, which would hopefully allow for successful wire crossing and subsequent PTCA and stenting. He will also likely need further evaluation of prox LAD.  Patient would like to proceed with this option..  Schedule for cardiac catheterization, and possible angioplasty. We discussed regarding risks, benefits, alternatives to this including stress testing, CTA and continued medical therapy. Patient wants to proceed. Understands <1-2% risk of death, stroke, MI, urgent CABG, bleeding, infection, renal failure but not limited to these.  Continue Aspirin, Brilinta, Lipitor, metoprolol, amlodipine.  Epigastric pain, positive cologuard: He may need further GI evaluation. However, given unstable CAD, recommend coronary intervention first.  Plavix could potentially be held as early as 30 days post PCI, if needed, for further GI workup. If no urgency, I would recommend 3-6 months of uninterrupted DAPT with Asprin and Brilinta   Hyponatremia: Resolved  Hypertension: Blood pressure elevated, suspect component of whitecoat hypertension. SBP as low as 94 mmHg at home. No further changes made today.  F/u after cath    Nigel Mormon, MD Pager: [phone] Office: [phone]

## 2021-08-05 ENCOUNTER — Telehealth: Payer: Self-pay | Admitting: Gastroenterology

## 2021-08-05 ENCOUNTER — Telehealth: Payer: Self-pay

## 2021-08-05 DIAGNOSIS — T81718A Complication of other artery following a procedure, not elsewhere classified, initial encounter: Secondary | ICD-10-CM | POA: Insufficient documentation

## 2021-08-05 NOTE — Telephone Encounter (Signed)
I appreciate input from Dr. Tarri Glenn.  We both remain concerned regarding his cardiac risk for undergoing endoscopy or colonoscopy, that could potentially require any intervention, and the risk related to sedation.  Given that Edgar Garcia is bleeding is limited to streaks of blood at this time, we can watchfully monitor this for now.  I recommended Edgar Garcia to hold Brilinta for now.  I will potentially introduce Plavix with hopes of less bleeding risk in the coming week.  I will also check a repeat hemoglobin in the next week.  Given his persistent angina symptoms and relatively limited bleeding, we will still keep procedure of coronary intervention on 11/29.  Best hope would be a successful percutaneous coronary intervention, followed by GI evaluation in the near future.  If absolutely needed, aspirin and Plavix can be held in 30 days from PCI in order to proceed with GI work-up.   Edgar Mormon, MD Pager: [phone] Office: [phone]

## 2021-08-05 NOTE — Telephone Encounter (Signed)
Patient called and said that he is having red in his stools as of this morning

## 2021-08-05 NOTE — Telephone Encounter (Signed)
[facility] called to let you know that Patwardhan, Reynold Bowen, MD will be forwarding you a message for advise.  Thanks

## 2021-08-05 NOTE — Telephone Encounter (Signed)
Spoke with the patient.  Patient with severe CAD, and class III angina symptoms, who also had a positive Cologuard test few weeks ago.  Given absence of active bleeding and presence of concerning anginal symptoms and had a stress test, he underwent coronary angiography and attempted coronary intervention.  Unfortunately, he had an unsuccessful coronary intervention and had a small periprocedural myocardial infarction episode.  The original plan was to let coronary dissection heal and reattempt coronary intervention on 08/16/2021.  However, patient called today after he noticed black streaks as well as bright red blood in his stools.  I am concerned about his active GI bleeding at this point.  I recommend stopping Brilinta for now.  I will get input from gastroenterologist Dr. Tarri Glenn.  After discussion with Dr. Tarri Glenn, we will make a definitive decision about repeat coronary intervention versus not.  I will forward my telephone encounter to Dr. Tarri Glenn and seek her input.  Nigel Mormon, MD Pager: [phone] Office: [phone]

## 2021-08-07 NOTE — Telephone Encounter (Signed)
I spoke with Dr. Robb Matar on Friday when I was out of the office.

## 2021-08-08 ENCOUNTER — Other Ambulatory Visit: Payer: Self-pay

## 2021-08-08 ENCOUNTER — Other Ambulatory Visit: Payer: Self-pay | Admitting: Cardiology

## 2021-08-08 DIAGNOSIS — I25118 Atherosclerotic heart disease of native coronary artery with other forms of angina pectoris: Secondary | ICD-10-CM

## 2021-08-08 DIAGNOSIS — K259 Gastric ulcer, unspecified as acute or chronic, without hemorrhage or perforation: Secondary | ICD-10-CM

## 2021-08-08 DIAGNOSIS — D500 Iron deficiency anemia secondary to blood loss (chronic): Secondary | ICD-10-CM

## 2021-08-08 MED ORDER — CLOPIDOGREL BISULFATE 75 MG PO TABS: 75.0000 mg | ORAL_TABLET | Freq: Every day | ORAL | 3 refills | Status: DC

## 2021-08-08 MED ORDER — PANTOPRAZOLE SODIUM 40 MG PO TBEC
40.0000 mg | DELAYED_RELEASE_TABLET | Freq: Two times a day (BID) | ORAL | 3 refills | Status: DC
Start: 2021-08-08 — End: 2021-12-06

## 2021-08-08 NOTE — Telephone Encounter (Signed)
Called pt and informed of recommendations as discussed and documented by Dr. Virgina Jock and Dr. Tarri Glenn. Verbalized acceptance and understanding. Pantoprazole sent to pt preferred pharmacy. Orders for labs also placed. Pt expressed full understanding of s/sx of anemia and to report to ED should he have any worsening symptoms.

## 2021-08-08 NOTE — Telephone Encounter (Signed)
Called pt and informed him about my conversation with Dr. Tarri Glenn (see below) and appt with [facility]. Advised we are aware he may not be able to keep this appt given the date/time of cardiac procedure. Advised he has been added to a move up list. Should an appt come open sooner, advised he will receive an automated notification asking if he would like to move up appt. Strongly advised he move up appt if he receives such a notification. Verbalized acceptance and understanding. Pt also states he will try to attend 11/30 appt if he is able, of course all dependent upon outcome from cardiac procedure.

## 2021-08-08 NOTE — Telephone Encounter (Signed)
Spoke with Edgar Garcia and prescribed plavix 75 mg, in lieu of Brilinta. This will also tell us if he is tolerating plavix before we commit him to coronary stenting.   [name], I noticed his appt in your office is on 11/30. He may likely be admitted at least overnight after his complex coronary procedure on 11/29. I thought I should mention to you if that would affect his appt on 11/30.  Thanks MJP

## 2021-08-09 ENCOUNTER — Other Ambulatory Visit (INDEPENDENT_AMBULATORY_CARE_PROVIDER_SITE_OTHER): Payer: Medicare PPO

## 2021-08-09 ENCOUNTER — Telehealth (HOSPITAL_COMMUNITY): Payer: Self-pay | Admitting: Pharmacist

## 2021-08-09 DIAGNOSIS — D500 Iron deficiency anemia secondary to blood loss (chronic): Secondary | ICD-10-CM | POA: Diagnosis not present

## 2021-08-09 DIAGNOSIS — K259 Gastric ulcer, unspecified as acute or chronic, without hemorrhage or perforation: Secondary | ICD-10-CM

## 2021-08-09 LAB — CBC
HCT: 43.2 % (ref 39.0–52.0)
Hemoglobin: 14.7 g/dL (ref 13.0–17.0)
MCHC: 34 g/dL (ref 30.0–36.0)
MCV: 98.6 fl (ref 78.0–100.0)
Platelets: 352 10*3/uL (ref 150.0–400.0)
RBC: 4.38 Mil/uL (ref 4.22–5.81)
RDW: 11.8 % (ref 11.5–15.5)
WBC: 6.1 10*3/uL (ref 4.0–10.5)

## 2021-08-09 NOTE — Telephone Encounter (Signed)
No answer

## 2021-08-10 ENCOUNTER — Ambulatory Visit: Payer: Medicare PPO | Admitting: Cardiology

## 2021-08-12 ENCOUNTER — Telehealth: Payer: Self-pay | Admitting: Cardiology

## 2021-08-12 DIAGNOSIS — I2510 Atherosclerotic heart disease of native coronary artery without angina pectoris: Secondary | ICD-10-CM | POA: Insufficient documentation

## 2021-08-12 DIAGNOSIS — I25118 Atherosclerotic heart disease of native coronary artery with other forms of angina pectoris: Secondary | ICD-10-CM | POA: Insufficient documentation

## 2021-08-12 NOTE — Addendum Note (Signed)
Addended by: Nigel Mormon on: 08/12/2021 02:38 PM   Modules accepted: Orders

## 2021-08-12 NOTE — Telephone Encounter (Signed)
I reviewed the case with my interventional colleagues. We felt that 3 weeks was too soon for 07/26/2021 dissection to be healed and realistically expect successful outcome with a repeat percutaneous procedure. Even a diagnostic angiogram has potential to further propagate dissection.  I called the patient to further discuss this today. Fortunately, patient is not overtly symptomatic at this time. His lower retrosternal/epigastric burning sensation at rest seems to have much improved with pantoprazole. Therefore, this is more likely to be GI in origin, rather than resting ischemic pain.  Hb has been stable. He has not had any recurrent GI bleeding.  This gives [place] more time before ay repeat intervention. He is currently walking 1/2-1 miles with his dog, doing light yard work, with no significant retrosternal chest pain. His physical activity remains lower than baseline. We mutually decided to continue medical therapy for now. Continue metoprolol and amlodipine as anti anginal therapy. Stopped lisinopril given his dizziness and low blood pressure. We can add Ranexa in future, if needed. If true anginal symptoms return after increasing physical activity returns, we can discuss the following options  1) Repeat coronary angiography followed by surgical consultation, perhaps for single vessel CABG through mini thoracotomy vs 2) Repeat coronary angiography and repeat attempt at percutaneous intervention  At this time, we will cancel his procedure dated 11/29. I will see him in the office on 12/5. I will also keep his gastroenterologist Dr. Tarri Glenn in the loop.    Nigel Mormon, MD Pager: [phone] Office: [phone]

## 2021-08-15 ENCOUNTER — Other Ambulatory Visit (HOSPITAL_COMMUNITY): Payer: Self-pay

## 2021-08-15 ENCOUNTER — Telehealth (HOSPITAL_COMMUNITY): Payer: Self-pay

## 2021-08-15 NOTE — Telephone Encounter (Signed)
Attempted to call Mr. Edgar Garcia for a follow up conversation. LVM

## 2021-08-16 ENCOUNTER — Ambulatory Visit (HOSPITAL_COMMUNITY): Admission: RE | Admit: 2021-08-16 | Payer: Medicare PPO | Source: Ambulatory Visit | Admitting: Cardiology

## 2021-08-16 ENCOUNTER — Encounter (HOSPITAL_COMMUNITY): Admission: RE | Payer: Self-pay | Source: Ambulatory Visit

## 2021-08-16 SURGERY — CORONARY STENT INTERVENTION
Anesthesia: LOCAL

## 2021-08-16 NOTE — Telephone Encounter (Signed)
Called and spoke with pt. He denies any signs of GI bleeding. Advised pt to keep appt as scheduled on 08/25/21 with Carl Best, NP. Pt had no other concerns at the end of the call.

## 2021-08-17 ENCOUNTER — Ambulatory Visit: Payer: Medicare PPO | Admitting: Nurse Practitioner

## 2021-08-22 ENCOUNTER — Other Ambulatory Visit: Payer: Self-pay

## 2021-08-22 ENCOUNTER — Other Ambulatory Visit: Payer: Self-pay | Admitting: Cardiology

## 2021-08-22 ENCOUNTER — Ambulatory Visit: Payer: Medicare PPO | Admitting: Cardiology

## 2021-08-22 ENCOUNTER — Encounter: Payer: Self-pay | Admitting: Cardiology

## 2021-08-22 VITALS — BP 146/84 | HR 72 | Temp 98.3°F | Ht 70.5 in | Wt 174.0 lb

## 2021-08-22 DIAGNOSIS — E782 Mixed hyperlipidemia: Secondary | ICD-10-CM

## 2021-08-22 DIAGNOSIS — I10 Essential (primary) hypertension: Secondary | ICD-10-CM

## 2021-08-22 DIAGNOSIS — I2510 Atherosclerotic heart disease of native coronary artery without angina pectoris: Secondary | ICD-10-CM

## 2021-08-22 MED ORDER — LISINOPRIL 10 MG PO TABS: 10.0000 mg | ORAL_TABLET | Freq: Every day | ORAL | 1 refills | Status: DC

## 2021-08-22 NOTE — Progress Notes (Signed)
Patient referred by Edgar Major, MD for exertional chest pain  Subjective:   Edgar Garcia, male    DOB: 02-03-1951, 70 y.o.   MRN: [id number]   Chief Complaint  Patient presents with   Coronary Artery Disease        Follow-up     HPI  70 year old male with hypertension, hyperlipidemia, prediabetes, prior h/o heavy alcohol use, now with CAD s/p unsuccessful percutaneous revascularization complicated by coronary dissection and peri-procedural MI (07/26/2021), positive stool occult blood test  Patient is doing fairly well at this time. He is walking with his dog for little over a mile, able to walk uphill to take trash out, without any exertional chest tightness symptoms-similar to what he had experienced previously. His epigastric pain has resolved after starting pantoprazole. He has occasional, random episodes of burning in his throat, that does seem to correlate with exertion.   On 08/12/2021, we had a telephone encounter-detailed below, where we had decided not to pursue percutaneous intervention previously scheduled on 08/16/2021, as we felt risks outweighed benefits, especially in absence of true angina symptoms on medical therapy.   Tel encounter 08/12/2021: I reviewed the case with my interventional colleagues. We felt that 3 weeks was too soon for 07/26/2021 dissection to be healed and realistically expect successful outcome with a repeat percutaneous procedure. Even a diagnostic angiogram has potential to further propagate dissection.   I called the patient to further discuss this today. Fortunately, patient is not overtly symptomatic at this time. His lower retrosternal/epigastric burning sensation at rest seems to have much improved with pantoprazole. Therefore, this is more likely to be GI in origin, rather than resting ischemic pain.  Hb has been stable. He has not had any recurrent GI bleeding.   This gives [place] more time before ay repeat intervention. He is currently  walking 1/2-1 miles with his dog, doing light yard work, with no significant retrosternal chest pain. His physical activity remains lower than baseline. We mutually decided to continue medical therapy for now. Continue metoprolol and amlodipine as anti anginal therapy. Stopped lisinopril given his dizziness and low blood pressure. We can add Ranexa in future, if needed. If true anginal symptoms return after increasing physical activity returns, we can discuss the following options   1) Repeat coronary angiography followed by surgical consultation, perhaps for single vessel CABG through mini thoracotomy vs 2) Repeat coronary angiography and repeat attempt at percutaneous intervention   At this time, we will cancel his procedure dated 11/29. I will see him in the office on 12/5. I will also keep his gastroenterologist Dr. Tarri Glenn in the loop.   Patient underwent coronary angiography on 07/26/2021. Please see discharge summary below.  He has continued to have lower chest/upper epigastric discomfort. He has significantly reduced his physical activity. His colonoscopy for positive cologuard test is pending, but he denies any frank bleeding.   Discharge summary 07/28/2021: Patient had very complex and challenging anatomy in his mid LAD. Unfortunately, in attempt to cross the lesion, he had dissection with cessation of mid to distal LAD flow-associated with angina symptoms. I was able to partially reestablish flow in distal LAD.  Given dissection, chances of successful PCI are low at this time. No emergent need for CABG.  Patient was treated with aspirin and tirofiban, followed by addition of Brilinta.  Patient did have elevation in his troponin to of 3700, with subsequent resolution of chest pain the same night.  Echocardiogram showed preserved EF with apical  and anteroapical hypokinesis.  BNP was mildly elevated without any other evidence of congestive heart failure.  Rest of the hospital stay was uneventful  barring occasional vasovagal episode and 6 beat nonsustained VT.  Patient did not have any chest pain at rest or with ambulation in the hallway.  He was discharged on dual antiplatelet therapy, and antianginal therapy including amlodipine and metoprolol succinate.  I avoided starting Ranexa given prolonged QT.  Subsequent EKGs showed deep T wave inversions reflective of myocardial injury, without any ongoing chest pain.   After detailed discussion with patient and wife, we decided to attempt outpatient PCI to mid to distal LAD later this month. If unfavorable anatomy or unsuccessful PCI, will consider surgical consultation.   On a separate note, patient was noted to have hyponatremia of 129, with normal serum muscularity and urine sodium.  Patient is not having any symptoms related to hyponatremia.  I have ordered repeat blood work for next week which I will discuss on upcoming office visit.  Tentatively scheduled for PCI on 11/29.    Initial consultation HPI 06/2021: Patient is here with his wife today.  He is a Curator, works in Scientific laboratory technician.  He has had hypertension hyperlipidemia for 9 years, but he was.  Most with a TIA episode.  He since been on lisinopril 40 mg daily, amlodipine 10 mg daily, aspirin 81 mg daily, Crestor 10 mg daily.  He stays active, walking 2-3 miles several days a week.  Recently, he has noted episodes of personal burning sensation occurs at top of the head of the hill, improves with rest.  Episodes occur on a regular basis, and fairly impressive up with the symptoms or physical activity.  Blood pressure is elevated today, but usually much lower than this.  He notices that blood pressure is always elevated physician visits, and much lower at home.  He denies any recurrent TIA, stroke symptoms since his 1 episode of TIA 9 years ago.  He denies any shortness of breath, palpitation, orthopnea, PND symptoms.  On a separate note, he recently tested positive for colitis.   He has an upcoming appointment with gastroenterology next week.  Current Outpatient Medications on File Prior to Visit  Medication Sig Dispense Refill   amLODipine (NORVASC) 5 MG tablet Take 5 mg by mouth in the morning.     aspirin 81 MG EC tablet Take 81 mg by mouth in the morning.     atorvastatin (LIPITOR) 40 MG tablet Take 1 tablet (40 mg total) by mouth every evening. 30 tablet 2   clopidogrel (PLAVIX) 75 MG tablet Take 1 tablet (75 mg total) by mouth daily. 30 tablet 3   metoprolol succinate (TOPROL-XL) 25 MG 24 hr tablet Take 1 tablet (25 mg total) by mouth daily. Take with or immediately following a meal. 30 tablet 3   nitroGLYCERIN (NITROSTAT) 0.4 MG SL tablet Place 1 tablet (0.4 mg total) under the tongue every 5 (five) minutes as needed for chest pain. 90 tablet 3   pantoprazole (PROTONIX) 40 MG tablet Take 1 tablet (40 mg total) by mouth 2 (two) times daily. 60 tablet 3   vitamin B-12 (CYANOCOBALAMIN) 1000 MCG tablet Take 1,000 mcg by mouth in the morning.     No current facility-administered medications on file prior to visit.    Cardiovascular and other pertinent studies:  EKG 08/22/2021: Sinus rhythm 70 bpm Frequent PVCs  Anterolateral R wave inversion, less pronounced compare to previous EKG  EKG 07/28/2021: Normal  sinus rhythm Marked T wave abnormality, consider anterolateral ischemia Prolonged QT Abnormal ECG  Echocardiogram 07/27/2021:  1. Left ventricular ejection fraction, by estimation, is 65 to 70%. The  left ventricle has normal function. The left ventricle demonstrates  regional wall motion abnormalities-mild hypokinesis in apical/anterior  apical myocardium. Left ventricular  diastolic parameters are consistent with Grade I diastolic dysfunction  (impaired relaxation). These changes are new compared previous outpatient  study in 06/2021.   2. There is mildly elevated pulmonary artery systolic pressure at 39  mmHg.   3. The inferior vena cava is normal  in size with greater than 50%  respiratory variability, suggesting right atrial pressure of 3 mmHg.   Coronary intervention 07/26/2021: LM: Normal LAD: Prox 70-75% severe calcific disease         Mid LAD focal 95% stenosis Lcx: Dominant. No significant disease RCA: Non-ominant. Proximal 90% stenosis   Unsuccessful percutaneous coronary intervention mid LAD Dissection and partial vessel closure Ventricular fibrillation requiring emergent defibrillation Patient hemodynamically and electrically stable   Monitor in ICU overnight. Recommend Aspirin/Aggrastat. Provided mo clinical deterioration overnight requiring emergency CABG (less likely), will load with Brilint ain AM.    Will consider repeat angiography and intervention in 3-4 weeks after healing of dissection.  Exercise Tetrofosmin stress test 07/12/2021: Exercise nuclear stress test was performed using Bruce protocol. Patient reached 4.6 METS, and 90% of age predicted maximum heart rate. Exercise capacity was low. No chest pain reported, dyspnea and dizziness reported. Heart rate and hemodynamic response were normal. Stress EKG showed sinus tachycardia, nonspecific T wave inversion leads III, aVF, 1-1.5 mm upsloping ST depression in leads V5, V6 that normalize 1 min into recovery. SPECT images showed small sized, medium intensity, reversible perfusion defect in apical anterior, apical myocardium.  TID 1.23. Stress LVEF 80%.  High risk study due to regional perfusion deficit with TID >1.2.   EKG 06/29/2021: Sinus rhythm 84 bpm baseline artifact   Recent labs: 06/14/2021: Glucose 96, BUN/Cr 7/0.72. EGFR >90. Na/K 143/4.2. Albumin 5.1. Rest of the CMP normal H/H 16/47. MCV 99.7. Platelets 288 HbA1C 5.5% Chol 164, TG 64, HDL 69, LDL 84 TSH N/A   Review of Systems  Cardiovascular:  Positive for chest pain. Negative for dyspnea on exertion, leg swelling, palpitations and syncope.  Musculoskeletal:        Shoulder discomfort   Gastrointestinal:        Positive Cologuard test, upcoming visit with gastroenterology.        Vitals:   08/22/21 1225  BP: (!) 146/84  Pulse: 72  Temp: 98.3 F (36.8 C)  SpO2: 100%    Body mass index is 24.61 kg/m. Filed Weights   08/22/21 1225  Weight: 174 lb (78.9 kg)   Orthostatic VS for the past 72 hrs (Last 3 readings):  Patient Position BP Location Cuff Size  08/22/21 1225 Sitting Left Arm Normal      Objective:   Physical Exam Vitals and nursing note reviewed.  Constitutional:      General: He is not in acute distress. Neck:     Vascular: No JVD.  Cardiovascular:     Rate and Rhythm: Normal rate and regular rhythm.     Heart sounds: Normal heart sounds. No murmur heard. Pulmonary:     Effort: Pulmonary effort is normal.     Breath sounds: Normal breath sounds. No wheezing or rales.  Musculoskeletal:     Right lower leg: No edema.     Left lower leg: No  edema.         Assessment & Recommendations:    70 year old male with hypertension, hyperlipidemia, prediabetes, prior h/o heavy alcohol use, now with CAD s/p unsuccessful percutaneous revascularization complicated by coronary dissection and peri-procedural MI (07/26/2021), positive stool occult blood test  CAD: No active angina symptoms on current anti anginal therapy.  No active bleeding on Aspirin and plavix. Continue metoprolol succinate 25 mg daily, Imdur 30 mg daily.Continue statin.  Resume lisinopril at 10 mg daily, as BP is now higher than before. Check BMP in 10-14 days.  In absence of angina symptoms, previous unsuccessful PCI attempt with mid LAD coronary dissection, I feel risk of intervention outweighs benefit. Continue current anti anginal therapy. As he slowly increases his activity, I will reassess his symptoms in 4 weeks. If he has recurrent symptoms, we can consider adding ranexa. If symptoms persist, will then perform repeat diagnostic coronary angiogram. At tha point, we can  consider options of single vessel CABG by mini-thotacotomy vs repeat attempt at PCI.   Patient is agreeable with this approach.   Since he has no active GI bleeding, I reckon it may be reasonable to hold colonoscopy and let 3-6 months pass since his limited  Hypertension: As above  F/u in 4 weeks    Nigel Mormon, MD Pager: [phone] Office: [phone]

## 2021-08-25 ENCOUNTER — Encounter: Payer: Self-pay | Admitting: Nurse Practitioner

## 2021-08-25 ENCOUNTER — Ambulatory Visit: Payer: Medicare PPO | Admitting: Nurse Practitioner

## 2021-08-25 VITALS — BP 144/70 | HR 76 | Ht 68.5 in | Wt 175.4 lb

## 2021-08-25 DIAGNOSIS — R1084 Generalized abdominal pain: Secondary | ICD-10-CM | POA: Diagnosis not present

## 2021-08-25 DIAGNOSIS — R195 Other fecal abnormalities: Secondary | ICD-10-CM | POA: Diagnosis not present

## 2021-08-25 DIAGNOSIS — I25118 Atherosclerotic heart disease of native coronary artery with other forms of angina pectoris: Secondary | ICD-10-CM

## 2021-08-25 NOTE — Progress Notes (Signed)
+3.    08/25/2021 SENG LARCH [id number] Jul 31, 1951   Chief Complaint: Follow up anemia   History of Present Illness: Edgar Garcia is a 70 year old male with a past medical history of coronary artery disease and TIA 9 years ago.  He was initially seen in our office by Dr. Tarri Glenn on 07/08/2021 due to having a positive Cologuard test. A cardiac clearance was requested prior to pursuing a colonoscopy. Never had a screening colonoscopy.   He was admitted to the hospital 07/26/2021 following a complicated cardiac catheterization which resulted in a dissection with cessation of mid to distal LAD flow-associated with angina symptoms. Emergent CABG was not necessary. He was treated with ASA,Tirofiban followed by Brilinta. ECHO showed preserved EF with apical and anteroapical hypokinesis. He was discharged home 07/28/2021 on dual antiplatelet therapy, Amlodipine and Metoprolol.  He was subsequently switched to Plavix, remains on ASA 81mg  QD. He developed epigastric burning and he reported passing a normal brown BM with a small amount of bright red blood 2 or 3 weeks ago. He was started on Pantoprazole 40mg  bid and his epigastric pain has nearly abated and no further rectal bleeding. He describes having mild lower abdominal gas discomfort which comes and goes, no significant abdominal pain. He eats yogurt most days, cheese a few days weekly. Brother with history of colon polyps. No known family history of colorectal cancer.   He was seen by his cardiologist Dr. Ardelle Lesches 08/22/2021, at that time Lisinopril was restarted and Metoprolol, Amlodipine, Imdur, Lipitor, Plavix and ASA were continued. No further angina symptoms and he discussed a repeat coronary angiogram in 4 weeks then decide on pursuing a single vessel CABG by mini-thotacotomy vs repeat attempt at PCI. Since his Hg level has remained stable without any further episodes of obvious rectal bleeding, Dr. Virgina Jock recommended holding off on a  colonoscopy for 3 to 6 months.   CBC Latest Ref Rng & Units 08/09/2021 07/29/2021 07/27/2021  WBC 4.0 - 10.5 K/uL 6.1 8.5 8.9  Hemoglobin 13.0 - 17.0 g/dL 14.7 15.8 15.2  Hematocrit 39.0 - 52.0 % 43.2 44.8 42.8  Platelets 150.0 - 400.0 K/uL 352.0 330 278      CMP Latest Ref Rng & Units 07/29/2021 07/28/2021 07/27/2021  Glucose 70 - 99 mg/dL 127(H) 108(H) 98  BUN 8 - 27 mg/dL 7(L) 6(L) 6(L)  Creatinine 0.76 - 1.27 mg/dL 0.78 0.62 0.66  Sodium 134 - 144 mmol/L 134 129(L) 132(L)  Potassium 3.5 - 5.2 mmol/L 3.9 3.6 3.6  Chloride 96 - 106 mmol/L 94(L) 99 100  CO2 20 - 29 mmol/L 22 23 23   Calcium 8.6 - 10.2 mg/dL 9.3 8.4(L) 8.6(L)  Total Protein 6.0 - 8.3 g/dL - - -  Total Bilirubin 0.3 - 1.2 mg/dL - - -  Alkaline Phos 39 - 117 U/L - - -  AST 0 - 37 U/L - - -  ALT 0 - 53 U/L - - -    Echocardiogram 07/27/2021:  1. Left ventricular ejection fraction, by estimation, is 65 to 70%. The  left ventricle has normal function. The left ventricle demonstrates  regional wall motion abnormalities-mild hypokinesis in apical/anterior  apical myocardium. Left ventricular  diastolic parameters are consistent with Grade I diastolic dysfunction  (impaired relaxation). These changes are new compared previous outpatient  study in 06/2021.   2. There is mildly elevated pulmonary artery systolic pressure at 39  mmHg.   3. The inferior vena cava is normal in size with greater than 50%  respiratory variability, suggesting right atrial pressure of 3 mmHg.    Coronary intervention 07/26/2021: LM: Normal LAD: Prox 70-75% severe calcific disease         Mid LAD focal 95% stenosis Lcx: Dominant. No significant disease RCA: Non-ominant. Proximal 90% stenosis   Unsuccessful percutaneous coronary intervention mid LAD Dissection and partial vessel closure Ventricular fibrillation requiring emergent defibrillation Patient hemodynamically and electrically stable   Current Outpatient Medications on File Prior to  Visit  Medication Sig Dispense Refill   amLODipine (NORVASC) 5 MG tablet Take 5 mg by mouth in the morning.     aspirin 81 MG EC tablet Take 81 mg by mouth in the morning.     atorvastatin (LIPITOR) 40 MG tablet Take 1 tablet (40 mg total) by mouth every evening. 30 tablet 2   clopidogrel (PLAVIX) 75 MG tablet Take 1 tablet (75 mg total) by mouth daily. 30 tablet 3   lisinopril (ZESTRIL) 10 MG tablet TAKE 1 TABLET(10 MG) BY MOUTH DAILY 90 tablet 0   metoprolol succinate (TOPROL-XL) 25 MG 24 hr tablet Take 1 tablet (25 mg total) by mouth daily. Take with or immediately following a meal. 30 tablet 3   nitroGLYCERIN (NITROSTAT) 0.4 MG SL tablet Place 1 tablet (0.4 mg total) under the tongue every 5 (five) minutes as needed for chest pain. 90 tablet 3   pantoprazole (PROTONIX) 40 MG tablet Take 1 tablet (40 mg total) by mouth 2 (two) times daily. 60 tablet 3   vitamin B-12 (CYANOCOBALAMIN) 1000 MCG tablet Take 1,000 mcg by mouth in the morning.     No current facility-administered medications on file prior to visit.   Allergies  Allergen Reactions   Brilinta [Ticagrelor] Other (See Comments)    Headaches    Isordil [Isosorbide Nitrate] Other (See Comments)    Headaches    Current Medications, Allergies, Past Medical History, Past Surgical History, Family History and Social History were reviewed in [employer] record.  Review of Systems:   Constitutional: Negative for fever, sweats, chills or weight loss.  Respiratory: Negative for shortness of breath.   Cardiovascular:See HPI. Gastrointestinal: See HPI.  Musculoskeletal: Negative for back pain or muscle aches.  Neurological: Negative for dizziness, headaches or paresthesias.   Physical Exam: BP (!) 144/70 (BP Location: Left Arm, Patient Position: Sitting, Cuff Size: Normal)   Pulse 76 Comment: irregular  Ht 5' 8.5" (1.74 m) Comment: Height measured without shoes  Wt 175 lb 6 oz (79.5 kg)   BMI 26.28 kg/m    General: 70 year old male in NAD.  Head: Normocephalic and atraumatic. Eyes: No scleral icterus. Conjunctiva pink . Ears: Normal auditory acuity. Mouth: Dentition intact. No ulcers or lesions.  Lungs: Clear throughout to auscultation. Heart: Regular rate and rhythm, no murmur. Abdomen: Soft, nontender and nondistended. No masses or hepatomegaly. Normal bowel sounds x 4 quadrants.  Rectal: Deferred.  Musculoskeletal: Symmetrical with no gross deformities. Extremities: No edema. Neurological: Alert oriented x 4. No focal deficits.  Psychological: Alert and cooperative. Normal mood and affect  Assessment and Recommendations:  90) 70 year old male with a positive Cologuard Test. Family history (brother) of colon polyps. Cardiology recommending holding off on colonoscopy for 3 to 6 months -Colonoscopy deferred until cardiac status stabilized, cardiac clearance required prior to scheduling a colonoscopy in 3 to 6 months   2) Epigastric burning, resolved after Pantoprazole initiated  -Consider EGD at time of eventual colonoscopy  -Continue Pantoprazole 40mg  bid   3) Significant  history of coronary artery disease. S/P unsuccessful PCI attempt with mid LAD coronary dissection followed closely by Dr. Carey Bullocks. On ASA and Plavix. If angina symptoms recur he may require a single vessel CABG by mini-thotacotomy vs repeat attempt at PCI.  -Follow up with cardiology in 4 weeks as planned   4) Generalized abdominal discomfort, primarily periumbilical and lower abdomen  -Patient to contact office if abdominal discomfort worsens  -CTAP if abdominal pain worsens  -Phillip's bacteria probiotic once daily -Ibgard 1 po bid -Stop dairy products or  take Lactaid which each dairy product

## 2021-08-25 NOTE — Patient Instructions (Signed)
If you are age 72 or older, your body mass index should be between 23-30. Your Body mass index is 26.28 kg/m. If this is out of the aforementioned range listed, please consider follow up with your Primary Care Provider.  The [facility] providers would like to encourage you to use [facility] to communicate with providers for non-urgent requests or questions.  Due to long hold times on the telephone, sending your provider a message by [facility] may be faster and more efficient way to get a response. Please allow 48 business hours for a response.  Please remember that this is for non-urgent requests/questions.  RECOMMENDATIONS: Stop all dairy products or take Lactaid 1-2 tablets with each dairy product. Phillips Bacteria Probiotic once a day. IBgard 1 capsule twice a day as needed for abdominal pain. Continue Pantoprazole 40 MG once a day. Contact our office to schedule a colonoscopy after you have been seen and cleared by cardiology.  It was great seeing you today! Thank you for entrusting me with your care and choosing [facility].  Noralyn Pick, CRNP

## 2021-08-26 ENCOUNTER — Telehealth: Payer: Self-pay | Admitting: Nurse Practitioner

## 2021-08-26 NOTE — Telephone Encounter (Signed)
Confirmed with the patient. He had no questions. Thanked me for the call.

## 2021-08-26 NOTE — Telephone Encounter (Signed)
Edgar Garcia, pls contact patient and let him know he should continue his Pantoprazole 40mg  one po bid as prescribed by Dr. Tarri Glenn. His AVS yesterday advised Pantoprazole 40mg  QD (this was my error).  I think he should stay on Pantoprazole 40mg  po bid for the next 2 months then reduce Pantoprazole 40mg  once daily if he does not have any epigastric pain or GERD sx at that time. Patient should contact Dr. Tarri Glenn in 3 months with GI and cardiac update. Thx

## 2021-08-26 NOTE — Progress Notes (Signed)
Reviewed and agree with management plans. ? ?Edgar Garcia L. Edgar Henkels, MD, MPH  ?

## 2021-09-02 ENCOUNTER — Ambulatory Visit: Payer: Medicare PPO | Admitting: Student

## 2021-09-06 ENCOUNTER — Other Ambulatory Visit: Payer: Self-pay

## 2021-09-06 ENCOUNTER — Telehealth: Payer: Self-pay | Admitting: Cardiology

## 2021-09-06 MED ORDER — ATORVASTATIN CALCIUM 40 MG PO TABS: 40.0000 mg | ORAL_TABLET | Freq: Every evening | ORAL | 2 refills | Status: DC

## 2021-09-06 NOTE — Telephone Encounter (Signed)
Req ref for

## 2021-09-28 ENCOUNTER — Encounter: Payer: Self-pay | Admitting: Cardiology

## 2021-09-28 ENCOUNTER — Ambulatory Visit: Payer: Medicare PPO | Admitting: Cardiology

## 2021-09-28 ENCOUNTER — Other Ambulatory Visit: Payer: Self-pay

## 2021-09-28 VITALS — BP 147/84 | HR 71 | Temp 98.6°F | Resp 16 | Ht 68.5 in | Wt 180.0 lb

## 2021-09-28 DIAGNOSIS — E782 Mixed hyperlipidemia: Secondary | ICD-10-CM

## 2021-09-28 DIAGNOSIS — I10 Essential (primary) hypertension: Secondary | ICD-10-CM

## 2021-09-28 DIAGNOSIS — I2510 Atherosclerotic heart disease of native coronary artery without angina pectoris: Secondary | ICD-10-CM

## 2021-09-28 MED ORDER — RANOLAZINE ER 500 MG PO TB12: 500.0000 mg | ORAL_TABLET | Freq: Two times a day (BID) | ORAL | 3 refills | Status: DC

## 2021-09-28 NOTE — Progress Notes (Signed)
Patient referred by [name], MD for exertional chest pain  Subjective:   Edgar Garcia, male    DOB: 03/28/1951, 71 y.o.   MRN: [id number]   Chief Complaint  Patient presents with   Coronary Artery Disease   Follow-up     HPI  71 year old male with hypertension, hyperlipidemia, prediabetes, prior h/o heavy alcohol use, now with CAD s/p unsuccessful percutaneous revascularization complicated by coronary dissection and peri-procedural MI (07/26/2021), positive stool occult blood test  Patient has now been walking up to 2 miles a day. His waking distance has increased since last visit. He has had discomfort in upper chest with walking, improved with rest. It does not occur at rest. He has shoulder and back pain with exertion movements.   However, his biggest complaint today is abdominal discomfort. He has been seeing Dr. Tarri Glenn. EGD and colonoscopy has bene on hold pending stabilization of his cardiac issues.   Patient underwent coronary angiography on 07/26/2021. Please see discharge summary below.  He has continued to have lower chest/upper epigastric discomfort. He has significantly reduced his physical activity. His colonoscopy for positive cologuard test is pending, but he denies any frank bleeding.   Initial consultation HPI 06/2021: Patient is here with his wife today.  He is a Curator, works in [employer] laboratory technician.  He has had hypertension hyperlipidemia for 9 years, but he was.  Most with a TIA episode.  He since been on lisinopril 40 mg daily, amlodipine 10 mg daily, aspirin 81 mg daily, Crestor 10 mg daily.  He stays active, walking 2-3 miles several days a week.  Recently, he has noted episodes of personal burning sensation occurs at top of the head of the hill, improves with rest.  Episodes occur on a regular basis, and fairly impressive up with the symptoms or physical activity.  Blood pressure is elevated today, but usually much lower than this.  He notices  that blood pressure is always elevated physician visits, and much lower at home.  He denies any recurrent TIA, stroke symptoms since his 1 episode of TIA 9 years ago.  He denies any shortness of breath, palpitation, orthopnea, PND symptoms.  On a separate note, he recently tested positive for colitis.  He has an upcoming appointment with gastroenterology next week.  Current Outpatient Medications on File Prior to Visit  Medication Sig Dispense Refill   amLODipine (NORVASC) 5 MG tablet Take 5 mg by mouth in the morning.     aspirin 81 MG EC tablet Take 81 mg by mouth in the morning.     atorvastatin (LIPITOR) 40 MG tablet Take 1 tablet (40 mg total) by mouth every evening. 30 tablet 2   clopidogrel (PLAVIX) 75 MG tablet Take 1 tablet (75 mg total) by mouth daily. 30 tablet 3   lisinopril (ZESTRIL) 10 MG tablet TAKE 1 TABLET(10 MG) BY MOUTH DAILY 90 tablet 0   metoprolol succinate (TOPROL-XL) 25 MG 24 hr tablet Take 1 tablet (25 mg total) by mouth daily. Take with or immediately following a meal. 30 tablet 3   nitroGLYCERIN (NITROSTAT) 0.4 MG SL tablet Place 1 tablet (0.4 mg total) under the tongue every 5 (five) minutes as needed for chest pain. 90 tablet 3   pantoprazole (PROTONIX) 40 MG tablet Take 1 tablet (40 mg total) by mouth 2 (two) times daily. 60 tablet 3   vitamin B-12 (CYANOCOBALAMIN) 1000 MCG tablet Take 1,000 mcg by mouth in the morning.  No current facility-administered medications on file prior to visit.    Cardiovascular and other pertinent studies:  EKG 08/22/2021: Sinus rhythm 70 bpm Frequent PVCs  Anterolateral T wave inversion, less pronounced compare to previous EKG  Echocardiogram 07/27/2021:  1. Left ventricular ejection fraction, by estimation, is 65 to 70%. The  left ventricle has normal function. The left ventricle demonstrates  regional wall motion abnormalities-mild hypokinesis in apical/anterior  apical myocardium. Left ventricular  diastolic parameters are  consistent with Grade I diastolic dysfunction  (impaired relaxation). These changes are new compared previous outpatient  study in 06/2021.   2. There is mildly elevated pulmonary artery systolic pressure at 39  mmHg.   3. The inferior vena cava is normal in size with greater than 50%  respiratory variability, suggesting right atrial pressure of 3 mmHg.   Coronary intervention 07/26/2021: LM: Normal LAD: Prox 70-75% severe calcific disease         Mid LAD focal 95% stenosis Lcx: Dominant. No significant disease RCA: Non-ominant. Proximal 90% stenosis   Unsuccessful percutaneous coronary intervention mid LAD Dissection and partial vessel closure Ventricular fibrillation requiring emergent defibrillation Patient hemodynamically and electrically stable   Monitor in ICU overnight. Recommend Aspirin/Aggrastat. Provided mo clinical deterioration overnight requiring emergency CABG (less likely), will load with Brilint ain AM.    Will consider repeat angiography and intervention in 3-4 weeks after healing of dissection.  Exercise Tetrofosmin stress test 07/12/2021: Exercise nuclear stress test was performed using Bruce protocol. Patient reached 4.6 METS, and 90% of age predicted maximum heart rate. Exercise capacity was low. No chest pain reported, dyspnea and dizziness reported. Heart rate and hemodynamic response were normal. Stress EKG showed sinus tachycardia, nonspecific T wave inversion leads III, aVF, 1-1.5 mm upsloping ST depression in leads V5, V6 that normalize 1 min into recovery. SPECT images showed small sized, medium intensity, reversible perfusion defect in apical anterior, apical myocardium.  TID 1.23. Stress LVEF 80%.  High risk study due to regional perfusion deficit with TID >1.2.   EKG 06/29/2021: Sinus rhythm 84 bpm baseline artifact   Recent labs: 06/14/2021: Glucose 96, BUN/Cr 7/0.72. EGFR >90. Na/K 143/4.2. Albumin 5.1. Rest of the CMP normal H/H 16/47. MCV 99.7.  Platelets 288 HbA1C 5.5% Chol 164, TG 64, HDL 69, LDL 84 TSH N/A   Review of Systems  Cardiovascular:  Positive for chest pain. Negative for dyspnea on exertion, leg swelling, palpitations and syncope.  Musculoskeletal:        Shoulder discomfort  Gastrointestinal:        Positive Cologuard test, upcoming visit with gastroenterology.        Vitals:   09/28/21 0945  BP: (!) 147/84  Pulse: 71  Resp: 16  Temp: 98.6 F (37 C)  SpO2: 100%    Body mass index is 26.97 kg/m. Filed Weights   09/28/21 0945  Weight: 180 lb (81.6 kg)       Objective:   Physical Exam Vitals and nursing note reviewed.  Constitutional:      General: He is not in acute distress. Neck:     Vascular: No JVD.  Cardiovascular:     Rate and Rhythm: Normal rate and regular rhythm.     Heart sounds: Normal heart sounds. No murmur heard. Pulmonary:     Effort: Pulmonary effort is normal.     Breath sounds: Normal breath sounds. No wheezing or rales.  Musculoskeletal:     Right lower leg: No edema.     Left lower  leg: No edema.         Assessment & Recommendations:    71 year old male with hypertension, hyperlipidemia, prediabetes, prior h/o heavy alcohol use, now with CAD s/p unsuccessful percutaneous revascularization complicated by coronary dissection and peri-procedural MI (07/26/2021), positive stool occult blood test  CAD: Increasing exercise capacity, but class II anginal symptoms. He has no resting angina symptoms at this time. Given his complex anatomy with procedural dissection, my overall recommendation is to pursue more conservative medical management, unless his symptoms were to get worse.  In fact, have started him on Ranexa 500 mg twice daily. If he were to require repeat attempted PCI, or single-vessel CABG (something is less enthusiastic about), his colonoscopy and EGD will most likely be postponed for several months, as he may need to be on uninterrupted antiplatelet  therapy. If his symptoms get better controlled with Ranexa, I would ideally recommend him to proceed with EGD and colonoscopy, since his pressing complaints at this time are his abdominal discomfort. I will repeat an echocardiogram in 3 weeks, and see him for follow-up in 4 weeks.  At that time, I will make further recommendation about safety and timing and safety of potential EGD and colonoscopy.  Continue aspirin and plavix. Continue metoprolol succinate 25 mg daily, Imdur 30 mg daily, statin.   Hypertension:  Fairly well controlled.  Blood pressure is much lower at home.    Patient is agreeable with this approach.   I will forward my note to Dr. Tarri Glenn.    F/u in 4 weeks    Nigel Mormon, MD Pager: [phone] Office: [phone]

## 2021-10-13 ENCOUNTER — Other Ambulatory Visit: Payer: Self-pay

## 2021-10-13 ENCOUNTER — Ambulatory Visit: Payer: Medicare PPO

## 2021-10-13 DIAGNOSIS — I2510 Atherosclerotic heart disease of native coronary artery without angina pectoris: Secondary | ICD-10-CM

## 2021-10-21 ENCOUNTER — Other Ambulatory Visit: Payer: Self-pay | Admitting: Cardiology

## 2021-10-21 DIAGNOSIS — I209 Angina pectoris, unspecified: Secondary | ICD-10-CM

## 2021-10-26 ENCOUNTER — Ambulatory Visit: Payer: Medicare PPO | Admitting: Cardiology

## 2021-10-26 ENCOUNTER — Other Ambulatory Visit: Payer: Self-pay

## 2021-10-26 ENCOUNTER — Encounter: Payer: Self-pay | Admitting: Cardiology

## 2021-10-26 VITALS — BP 151/86 | HR 64 | Temp 98.1°F | Resp 16 | Ht 68.0 in | Wt 172.0 lb

## 2021-10-26 DIAGNOSIS — E782 Mixed hyperlipidemia: Secondary | ICD-10-CM

## 2021-10-26 DIAGNOSIS — I10 Essential (primary) hypertension: Secondary | ICD-10-CM

## 2021-10-26 DIAGNOSIS — I2510 Atherosclerotic heart disease of native coronary artery without angina pectoris: Secondary | ICD-10-CM

## 2021-10-26 NOTE — Progress Notes (Signed)
Patient referred by [name], MD for exertional chest pain  Subjective:   Edgar Garcia, male    DOB: Apr 26, 1951, 71 y.o.   MRN: [id number]   Chief Complaint  Patient presents with   Coronary Artery Disease   Follow-up    4 week     HPI  71 year old male with hypertension, hyperlipidemia, prediabetes, prior h/o heavy alcohol use, now with CAD s/p unsuccessful percutaneous revascularization complicated by coronary dissection and peri-procedural MI (07/26/2021), positive stool occult blood test  Patient has been doing fairly well. He is walking for an hour every day at leisurely pace with only occasional episodes of exertional chest pain. He still has constant pain across his upper chest and shoulders, as well as headache. Blood pressure elevated today, but normal or low normal at home.   Reviewed and discussed recent echocardiogram results with the patient.   Initial consultation HPI 06/2021: Patient is here with his wife today.  He is a Curator, works in [employer] laboratory technician.  He has had hypertension hyperlipidemia for 9 years, but he was.  Most with a TIA episode.  He since been on lisinopril 40 mg daily, amlodipine 10 mg daily, aspirin 81 mg daily, Crestor 10 mg daily.  He stays active, walking 2-3 miles several days a week.  Recently, he has noted episodes of personal burning sensation occurs at top of the head of the hill, improves with rest.  Episodes occur on a regular basis, and fairly impressive up with the symptoms or physical activity.  Blood pressure is elevated today, but usually much lower than this.  He notices that blood pressure is always elevated physician visits, and much lower at home.  He denies any recurrent TIA, stroke symptoms since his 1 episode of TIA 9 years ago.  He denies any shortness of breath, palpitation, orthopnea, PND symptoms.  On a separate note, he recently tested positive for colitis.  He has an upcoming appointment with gastroenterology  next week.  Current Outpatient Medications on File Prior to Visit  Medication Sig Dispense Refill   amLODipine (NORVASC) 5 MG tablet Take 5 mg by mouth in the morning.     aspirin 81 MG EC tablet Take 81 mg by mouth in the morning.     atorvastatin (LIPITOR) 40 MG tablet Take 1 tablet (40 mg total) by mouth every evening. 30 tablet 2   clopidogrel (PLAVIX) 75 MG tablet Take 1 tablet (75 mg total) by mouth daily. 30 tablet 3   lisinopril (ZESTRIL) 10 MG tablet TAKE 1 TABLET(10 MG) BY MOUTH DAILY 90 tablet 0   metoprolol succinate (TOPROL-XL) 25 MG 24 hr tablet TAKE 1 TABLET(25 MG) BY MOUTH DAILY WITH OR IMMEDIATELY FOLLOWING A MEAL 90 tablet 1   nitroGLYCERIN (NITROSTAT) 0.4 MG SL tablet Place 1 tablet (0.4 mg total) under the tongue every 5 (five) minutes as needed for chest pain. 90 tablet 3   pantoprazole (PROTONIX) 40 MG tablet Take 1 tablet (40 mg total) by mouth 2 (two) times daily. 60 tablet 3   ranolazine (RANEXA) 500 MG 12 hr tablet Take 1 tablet (500 mg total) by mouth 2 (two) times daily. 60 tablet 3   vitamin B-12 (CYANOCOBALAMIN) 1000 MCG tablet Take 1,000 mcg by mouth in the morning.     No current facility-administered medications on file prior to visit.    Cardiovascular and other pertinent studies:  EKG 10/26/2021: Sinus rhythm 58 bpm Nonspecific T-abnormality  Echocardiogram 10/13/2021:  Normal  LV systolic function with visual EF 55-60%. Left ventricle cavity  is normal in size. Normal left ventricular wall thickness. Normal global  wall motion. Normal diastolic filling pattern, normal LAP.  Aortic valve sclerosis without stenosis. Trace aortic regurgitation.  Mild (Grade I) mitral regurgitation.  Mild tricuspid regurgitation. No evidence of pulmonary hypertension.  Compared to study 07/27/2021: LVEF remains preserved, no obvious RWMA on  current study, G1DD is now normal, otherwise no significant change.   EKG 08/22/2021: Sinus rhythm 70 bpm Frequent PVCs   Anterolateral T wave inversion, less pronounced compare to previous EKG  Echocardiogram 07/27/2021:  1. Left ventricular ejection fraction, by estimation, is 65 to 70%. The  left ventricle has normal function. The left ventricle demonstrates  regional wall motion abnormalities-mild hypokinesis in apical/anterior  apical myocardium. Left ventricular  diastolic parameters are consistent with Grade I diastolic dysfunction  (impaired relaxation). These changes are new compared previous outpatient  study in 06/2021.   2. There is mildly elevated pulmonary artery systolic pressure at 39  mmHg.   3. The inferior vena cava is normal in size with greater than 50%  respiratory variability, suggesting right atrial pressure of 3 mmHg.   Coronary intervention 07/26/2021: LM: Normal LAD: Prox 70-75% severe calcific disease         Mid LAD focal 95% stenosis Lcx: Dominant. No significant disease RCA: Non-ominant. Proximal 90% stenosis   Unsuccessful percutaneous coronary intervention mid LAD Dissection and partial vessel closure Ventricular fibrillation requiring emergent defibrillation Patient hemodynamically and electrically stable   Monitor in ICU overnight. Recommend Aspirin/Aggrastat. Provided mo clinical deterioration overnight requiring emergency CABG (less likely), will load with Brilint ain AM.    Will consider repeat angiography and intervention in 3-4 weeks after healing of dissection.  Exercise Tetrofosmin stress test 07/12/2021: Exercise nuclear stress test was performed using Bruce protocol. Patient reached 4.6 METS, and 90% of age predicted maximum heart rate. Exercise capacity was low. No chest pain reported, dyspnea and dizziness reported. Heart rate and hemodynamic response were normal. Stress EKG showed sinus tachycardia, nonspecific T wave inversion leads III, aVF, 1-1.5 mm upsloping ST depression in leads V5, V6 that normalize 1 min into recovery. SPECT images showed small  sized, medium intensity, reversible perfusion defect in apical anterior, apical myocardium.  TID 1.23. Stress LVEF 80%.  High risk study due to regional perfusion deficit with TID >1.2.   EKG 06/29/2021: Sinus rhythm 84 bpm baseline artifact   Recent labs: 06/14/2021: Glucose 96, BUN/Cr 7/0.72. EGFR >90. Na/K 143/4.2. Albumin 5.1. Rest of the CMP normal H/H 16/47. MCV 99.7. Platelets 288 HbA1C 5.5% Chol 164, TG 64, HDL 69, LDL 84 TSH N/A   Review of Systems  Cardiovascular:  Positive for chest pain. Negative for dyspnea on exertion, leg swelling, palpitations and syncope.  Musculoskeletal:        Shoulder discomfort  Gastrointestinal:        Positive Cologuard test, upcoming visit with gastroenterology.        Vitals:   10/26/21 1145 10/26/21 1150  BP: (!) 155/84 (!) 151/86  Pulse: 64 64  Resp: 16   Temp: 98.1 F (36.7 C)   SpO2: 97%     Body mass index is 26.15 kg/m. Filed Weights   10/26/21 1145  Weight: 172 lb (78 kg)       Objective:   Physical Exam Vitals and nursing note reviewed.  Constitutional:      General: He is not in acute distress. Neck:  Vascular: No JVD.  Cardiovascular:     Rate and Rhythm: Normal rate and regular rhythm.     Heart sounds: Normal heart sounds. No murmur heard. Pulmonary:     Effort: Pulmonary effort is normal.     Breath sounds: Normal breath sounds. No wheezing or rales.  Musculoskeletal:     Right lower leg: No edema.     Left lower leg: No edema.         Assessment & Recommendations:    71 year old male with hypertension, hyperlipidemia, prediabetes, prior h/o heavy alcohol use, now with CAD s/p unsuccessful percutaneous revascularization complicated by coronary dissection and peri-procedural MI (07/26/2021), positive stool occult blood test  CAD: Increasing exercise capacity, but stable class II angina symptoms His EKG and echocardiogram have normalized.  Continue Aspirin and plavix till 07/2022.  However, both can be held prior to colonoscopy up to 5 days.  Continue metoprolol, lisinopril, amlodipine., Imdur, Ranexa I offered to stop Imdur and increase Ranexa instead, given his headache and occasional lightheadedness. He wants to hold off until after colonoscopy/EGD.  Given his controlled CAD symptoms, I think it is reasonable to proceed with EGD/colonoscopy with acceptable cardiac risk.  I will see him back in 3 months  Mixed hyperlipidemia: Continue Lipitor 40 mg Check lipid panel  Hypertension:  Fairly well controlled.  Blood pressure is much lower at home.    I will forward my note to Dr. Tarri Glenn.    F/u in 3 months    Nigel Mormon, MD Pager: [phone] Office: [phone]

## 2021-10-27 ENCOUNTER — Telehealth: Payer: Self-pay

## 2021-10-27 DIAGNOSIS — K259 Gastric ulcer, unspecified as acute or chronic, without hemorrhage or perforation: Secondary | ICD-10-CM

## 2021-10-27 DIAGNOSIS — R195 Other fecal abnormalities: Secondary | ICD-10-CM

## 2021-10-27 DIAGNOSIS — R1084 Generalized abdominal pain: Secondary | ICD-10-CM

## 2021-10-27 DIAGNOSIS — D500 Iron deficiency anemia secondary to blood loss (chronic): Secondary | ICD-10-CM

## 2021-10-27 NOTE — Telephone Encounter (Signed)
Called pt to schedule dbl @ [facility], no PV required at this time. LVM requesting returned call. Below is clearance that has been received:  Increasing exercise capacity, but stable class II angina symptoms  His EKG and echocardiogram have normalized.  Continue Aspirin and plavix till 07/2022. However, both can be held prior to colonoscopy up to 5 days.  Continue metoprolol, lisinopril, amlodipine., Imdur, Ranexa  I offered to stop Imdur and increase Ranexa instead, given his headache and occasional lightheadedness. He wants to hold off until after colonoscopy/EGD.  Given his controlled CAD symptoms, I think it is reasonable to proceed with EGD/colonoscopy with acceptable cardiac risk.

## 2021-10-28 NOTE — Telephone Encounter (Signed)
Pt returned call. Scheduled for dbl 11/29/21 @ 330pm, arrival time 230pm. Amb referral placed for auth purposes. Prep instructions sent via MyChart per pt request. Advised about cardiologist's response re: holding Plavix. This has been included in prep instructions as well.

## 2021-10-28 NOTE — Telephone Encounter (Signed)
SECOND ATTEMPT: ° °LVM requesting returned call. °

## 2021-11-13 ENCOUNTER — Other Ambulatory Visit: Payer: Self-pay | Admitting: Cardiology

## 2021-11-13 DIAGNOSIS — I10 Essential (primary) hypertension: Secondary | ICD-10-CM

## 2021-11-24 ENCOUNTER — Encounter: Payer: Self-pay | Admitting: Gastroenterology

## 2021-11-29 ENCOUNTER — Other Ambulatory Visit: Payer: Self-pay

## 2021-11-29 ENCOUNTER — Ambulatory Visit (AMBULATORY_SURGERY_CENTER): Payer: Medicare PPO | Admitting: Gastroenterology

## 2021-11-29 ENCOUNTER — Encounter: Payer: Self-pay | Admitting: Gastroenterology

## 2021-11-29 VITALS — BP 124/73 | HR 71 | Temp 97.3°F | Resp 14 | Ht 68.0 in | Wt 175.0 lb

## 2021-11-29 DIAGNOSIS — K2100 Gastro-esophageal reflux disease with esophagitis, without bleeding: Secondary | ICD-10-CM

## 2021-11-29 DIAGNOSIS — D120 Benign neoplasm of cecum: Secondary | ICD-10-CM

## 2021-11-29 DIAGNOSIS — K317 Polyp of stomach and duodenum: Secondary | ICD-10-CM

## 2021-11-29 DIAGNOSIS — D123 Benign neoplasm of transverse colon: Secondary | ICD-10-CM

## 2021-11-29 DIAGNOSIS — R1084 Generalized abdominal pain: Secondary | ICD-10-CM

## 2021-11-29 DIAGNOSIS — K319 Disease of stomach and duodenum, unspecified: Secondary | ICD-10-CM | POA: Diagnosis not present

## 2021-11-29 DIAGNOSIS — D128 Benign neoplasm of rectum: Secondary | ICD-10-CM

## 2021-11-29 DIAGNOSIS — R195 Other fecal abnormalities: Secondary | ICD-10-CM | POA: Diagnosis not present

## 2021-11-29 DIAGNOSIS — D125 Benign neoplasm of sigmoid colon: Secondary | ICD-10-CM

## 2021-11-29 DIAGNOSIS — Z1211 Encounter for screening for malignant neoplasm of colon: Secondary | ICD-10-CM

## 2021-11-29 MED ORDER — SODIUM CHLORIDE 0.9 % IV SOLN: 500.0000 mL | INTRAVENOUS | Status: DC

## 2021-11-29 NOTE — Progress Notes (Signed)
Report to PACU, RN, vss, BBS= Clear.  

## 2021-11-29 NOTE — Progress Notes (Signed)
?+3. ? ? ? ?11/29/2021 ?Alben Spittle ?[id number] ?1950-10-18 ? ? ?Indication for Colonoscopy: Cologuard + ?Indication for EGD: Epigastric burning ? ?History of Present Illness: Edgar Garcia is a 71 year old male with a past medical history of coronary artery disease and TIA 9 years ago. Initially seen in our office by Dr. Tarri Glenn on 07/08/2021 due to having a positive Cologuard test. A cardiac clearance was requested prior to pursuing a colonoscopy. Never had a screening colonoscopy.  ? ?He was admitted to the hospital 07/26/2021 following a complicated cardiac catheterization which resulted in a dissection with cessation of mid to distal LAD flow-associated with angina symptoms. Emergent CABG was not necessary. He was treated with ASA,Tirofiban followed by Brilinta. ECHO showed preserved EF with apical and anteroapical hypokinesis. He was discharged home 07/28/2021 on dual antiplatelet therapy, Amlodipine and Metoprolol.  He was subsequently switched to Plavix, remains on ASA '81mg'$  QD. He developed epigastric burning and he reported passing a normal brown BM with a small amount of bright red blood 2 or 3 weeks ago. He was started on Pantoprazole '40mg'$  bid and his epigastric pain has nearly abated and no further rectal bleeding. He describes having mild lower abdominal gas discomfort which comes and goes, no significant abdominal pain. He eats yogurt most days, cheese a few days weekly. Brother with history of colon polyps. No known family history of colorectal cancer.  ? ?He was seen by his cardiologist Dr. Ardelle Lesches 08/22/2021, at that time Lisinopril was restarted and Metoprolol, Amlodipine, Imdur, Lipitor, Plavix and ASA were continued. No further angina symptoms and he discussed a repeat coronary angiogram in 4 weeks then decide on pursuing a single vessel CABG by mini-thotacotomy vs repeat attempt at PCI. Since his Hg level has remained stable without any further episodes of obvious rectal bleeding, Dr. Virgina Jock  recommended holding off on a colonoscopy for 3 to 6 months.  ? ?CBC Latest Ref Rng & Units 08/09/2021 07/29/2021 07/27/2021  ?WBC 4.0 - 10.5 K/uL 6.1 8.5 8.9  ?Hemoglobin 13.0 - 17.0 g/dL 14.7 15.8 15.2  ?Hematocrit 39.0 - 52.0 % 43.2 44.8 42.8  ?Platelets 150.0 - 400.0 K/uL 352.0 330 278  ?  ?  ?CMP Latest Ref Rng & Units 07/29/2021 07/28/2021 07/27/2021  ?Glucose 70 - 99 mg/dL 127(H) 108(H) 98  ?BUN 8 - 27 mg/dL 7(L) 6(L) 6(L)  ?Creatinine 0.76 - 1.27 mg/dL 0.78 0.62 0.66  ?Sodium 134 - 144 mmol/L 134 129(L) 132(L)  ?Potassium 3.5 - 5.2 mmol/L 3.9 3.6 3.6  ?Chloride 96 - 106 mmol/L 94(L) 99 100  ?CO2 20 - 29 mmol/L '22 23 23  '$ ?Calcium 8.6 - 10.2 mg/dL 9.3 8.4(L) 8.6(L)  ?Total Protein 6.0 - 8.3 g/dL - - -  ?Total Bilirubin 0.3 - 1.2 mg/dL - - -  ?Alkaline Phos 39 - 117 U/L - - -  ?AST 0 - 37 U/L - - -  ?ALT 0 - 53 U/L - - -  ?  ?Echocardiogram 07/27/2021: ? 1. Left ventricular ejection fraction, by estimation, is 65 to 70%. The  ?left ventricle has normal function. The left ventricle demonstrates  ?regional wall motion abnormalities-mild hypokinesis in apical/anterior  ?apical myocardium. Left ventricular  ?diastolic parameters are consistent with Grade I diastolic dysfunction  ?(impaired relaxation). These changes are new compared previous outpatient  ?study in 06/2021.  ? 2. There is mildly elevated pulmonary artery systolic pressure at 39  ?mmHg.  ? 3. The inferior vena cava is normal in size with greater than  50%  ?respiratory variability, suggesting right atrial pressure of 3 mmHg.  ?  ?Coronary intervention 07/26/2021: ?LM: Normal ?LAD: Prox 70-75% severe calcific disease ?        Mid LAD focal 95% stenosis ?Lcx: Dominant. No significant disease ?RCA: Non-ominant. Proximal 90% stenosis ?  ?Unsuccessful percutaneous coronary intervention mid LAD ?Dissection and partial vessel closure ?Ventricular fibrillation requiring emergent defibrillation ?Patient hemodynamically and electrically stable ?  ?Current Outpatient  Medications on File Prior to Visit  ?Medication Sig Dispense Refill  ? amLODipine (NORVASC) 5 MG tablet Take 5 mg by mouth in the morning.    ? aspirin 81 MG EC tablet Take 81 mg by mouth in the morning.    ? atorvastatin (LIPITOR) 40 MG tablet TAKE 1 TABLET(40 MG) BY MOUTH EVERY EVENING 30 tablet 2  ? clopidogrel (PLAVIX) 75 MG tablet Take 1 tablet (75 mg total) by mouth daily. 30 tablet 3  ? lisinopril (ZESTRIL) 10 MG tablet TAKE 1 TABLET(10 MG) BY MOUTH DAILY 90 tablet 0  ? metoprolol succinate (TOPROL-XL) 25 MG 24 hr tablet TAKE 1 TABLET(25 MG) BY MOUTH DAILY WITH OR IMMEDIATELY FOLLOWING A MEAL 90 tablet 1  ? nitroGLYCERIN (NITROSTAT) 0.4 MG SL tablet Place 1 tablet (0.4 mg total) under the tongue every 5 (five) minutes as needed for chest pain. 90 tablet 3  ? pantoprazole (PROTONIX) 40 MG tablet Take 1 tablet (40 mg total) by mouth 2 (two) times daily. 60 tablet 3  ? ranolazine (RANEXA) 500 MG 12 hr tablet Take 1 tablet (500 mg total) by mouth 2 (two) times daily. 60 tablet 3  ? vitamin B-12 (CYANOCOBALAMIN) 1000 MCG tablet Take 1,000 mcg by mouth in the morning.    ? ?No current facility-administered medications on file prior to visit.  ? ?Allergies  ?Allergen Reactions  ? Brilinta [Ticagrelor] Other (See Comments)  ?  Headaches ?  ? Isordil [Isosorbide Nitrate] Other (See Comments)  ?  Headaches  ? ? ?Current Medications, Allergies, Past Medical History, Past Surgical History, Family History and Social History were reviewed in [employer] record. ? ? ?Physical Exam: ?General: 71 year old male in NAD.  ?Head: Normocephalic and atraumatic. ?Eyes: No scleral icterus. Conjunctiva pink . ?Ears: Normal auditory acuity. ?Mouth: Dentition intact. No ulcers or lesions.  ?Lungs: Clear throughout to auscultation. ?Heart: Regular rate and rhythm, no murmur. ?Abdomen: Soft, nontender and nondistended. No masses or hepatomegaly. Normal bowel sounds x 4 quadrants.  ?Rectal: Deferred.   ?Musculoskeletal: Symmetrical with no gross deformities. ?Extremities: No edema. ?Neurological: Alert oriented x 4. No focal deficits.  ?Psychological: Alert and cooperative. Normal mood and affect ? ?Assessment and Recommendations: ? ?27) 71 year old male with a positive Cologuard Test. Family history (brother) of colon polyps. Cardiology has cleared him for colonoscopy.  ? ?2) Epigastric burning, resolved after Pantoprazole initiated  ?EGD planned.  ? ? ? ? ?

## 2021-11-29 NOTE — Op Note (Signed)
[place] ?Patient Name: Edgar Garcia ?Procedure Date: 11/29/2021 2:55 PM ?MRN: [id number] ?Endoscopist: Thornton Park MD, MD ?Age: 71 ?Referring MD:  ?Date of Birth: 31-Jan-1951 ?Gender: Male ?Account #: [id number] ?Procedure:                Colonoscopy ?Indications:              Positive Cologuard test ?Medicines:                Monitored Anesthesia Care ?Procedure:                Pre-Anesthesia Assessment: ?                          - Prior to the procedure, a History and Physical  ?                          was performed, and patient medications and  ?                          allergies were reviewed. The patient's tolerance of  ?                          previous anesthesia was also reviewed. The risks  ?                          and benefits of the procedure and the sedation  ?                          options and risks were discussed with the patient.  ?                          All questions were answered, and informed consent  ?                          was obtained. Prior Anticoagulants: The patient has  ?                          taken Plavix (clopidogrel), last dose was 5 days  ?                          prior to procedure. After reviewing the risks and  ?                          benefits, the patient was deemed in satisfactory  ?                          condition to undergo the procedure. ?                          After obtaining informed consent, the colonoscope  ?                          was passed under direct vision. Throughout the  ?  procedure, the patient's blood pressure, pulse, and  ?                          oxygen saturations were monitored continuously. The  ?                          Olympus CF-HQ190L ([id number]) Colonoscope was  ?                          introduced through the anus and advanced to the 3  ?                          cm into the ileum. The colonoscopy was extremely  ?                          difficult due to poor bowel prep with stool  ?                           present. The residual stool had significant food  ?                          fibers that continued to clog the scope, making  ?                          removal of residual liquid stool very difficult.  ?                          The patient tolerated the procedure well. The  ?                          quality of the bowel preparation was poor. The  ?                          terminal ileum, ileocecal valve, appendiceal  ?                          orifice, and rectum were photographed. ?Scope In: 3:18:14 PM ?Scope Out: 3:49:24 PM ?Scope Withdrawal Time: 0 hours 20 minutes 41 seconds  ?Total Procedure Duration: 0 hours 31 minutes 10 seconds  ?Findings:                 The perianal and digital rectal examinations were  ?                          normal. ?                          A 14 mm polyp was found in the rectum. The polyp  ?                          was semi-pedunculated. The polyp was removed with a  ?                          hot snare. Resection and retrieval were  complete.  ?                          Area was tattooed with an injection of 3 mL of Spot  ?                          (carbon black). ?                          Three sessile polyps were found in the rectum. The  ?                          polyps were 2 to 4 mm in size. These polyps were  ?                          removed with a cold snare. Resection and retrieval  ?                          were complete. Estimated blood loss was minimal. ?                          Two flat polyps were found in the sigmoid colon.  ?                          The polyps were less than 1 mm in size. These  ?                          polyps were removed with a cold biopsy forceps  ?                          after failed resection with a snare. Resection and  ?                          retrieval were complete. Estimated blood loss was  ?                          minimal. ?                          A 3 mm polyp was found in the sigmoid colon.  The  ?                          polyp was sessile. The polyp was removed with a  ?                          cold snare. Resection and retrieval were complete.  ?                          Estimated blood loss was minimal. ?                          A 5 mm polyp was found in the hepatic flexure. The  ?  polyp was sessile. The polyp was removed with a  ?                          cold snare. Resection and retrieval were complete.  ?                          Estimated blood loss was minimal. ?                          The exam was otherwise without abnormality on  ?                          direct and retroflexion views. ?Complications:            No immediate complications. ?Estimated Blood Loss:     Estimated blood loss was minimal. ?Impression:               - Preparation of the colon was poor. ?                          - One 14 mm polyp in the rectum, removed with a hot  ?                          snare. Resected and retrieved. Tattooed. ?                          - Three 2 to 4 mm polyps in the rectum, removed  ?                          with a cold snare. Resected and retrieved. ?                          - Two less than 1 mm polyps in the sigmoid colon,  ?                          removed with a cold biopsy forceps. Resected and  ?                          retrieved. ?                          - One 3 mm polyp in the sigmoid colon, removed with  ?                          a cold snare. Resected and retrieved. ?                          - One 5 mm polyp at the hepatic flexure, removed  ?                          with a cold snare. Resected and retrieved. ?                          - The examination was otherwise  normal on direct  ?                          and retroflexion views. ?Recommendation:           - Patient has a contact number available for  ?                          emergencies. The signs and symptoms of potential  ?                          delayed complications were discussed  with the  ?                          patient. Return to normal activities tomorrow.  ?                          Written discharge instructions were provided to the  ?                          patient. ?                          - Resume previous diet. ?                          - Continue present medications. ?                          - Await pathology results. ?                          - Repeat colonoscopy at the next available  ?                          appointment because the bowel preparation was poor.  ?                          Two day bowel prep at that time. ?                          - Emerging evidence supports eating a diet of  ?                          fruits, vegetables, grains, calcium, and yogurt  ?                          while reducing red meat and alcohol may reduce the  ?                          risk of colon cancer. ?Thornton Park MD, MD ?11/29/2021 4:05:00 PM ?This report has been signed electronically. ?

## 2021-11-29 NOTE — Progress Notes (Signed)
PT will follow up in the office with Dr. Tarri Glenn in 4 weeks to discuss pathology results and future colonoscopy. ?

## 2021-11-29 NOTE — Op Note (Signed)
[place] ?Patient Name: Edgar Garcia ?Procedure Date: 11/29/2021 2:56 PM ?MRN: [id number] ?Endoscopist: Thornton Park MD, MD ?Age: 71 ?Referring MD:  ?Date of Birth: August 04, 1951 ?Gender: Male ?Account #: [id number] ?Procedure:                Upper GI endoscopy ?Indications:              Heartburn ?Medicines:                Monitored Anesthesia Care ?Procedure:                Pre-Anesthesia Assessment: ?                          - Prior to the procedure, a History and Physical  ?                          was performed, and patient medications and  ?                          allergies were reviewed. The patient's tolerance of  ?                          previous anesthesia was also reviewed. The risks  ?                          and benefits of the procedure and the sedation  ?                          options and risks were discussed with the patient.  ?                          All questions were answered, and informed consent  ?                          was obtained. Prior Anticoagulants: The patient has  ?                          taken no previous anticoagulant or antiplatelet  ?                          agents. ASA Grade Assessment: III - A patient with  ?                          severe systemic disease. After reviewing the risks  ?                          and benefits, the patient was deemed in  ?                          satisfactory condition to undergo the procedure. ?                          After obtaining informed consent, the endoscope was  ?  passed under direct vision. Throughout the  ?                          procedure, the patient's blood pressure, pulse, and  ?                          oxygen saturations were monitored continuously. The  ?                          GIF HQ190 #[id number] was introduced through the  ?                          mouth, and advanced to the third part of duodenum.  ?                          The patient tolerated the procedure well. The  upper  ?                          GI endoscopy was accomplished without difficulty. ?Scope In: ?Scope Out: ?Findings:                 LA Grade A (one or more mucosal breaks less than 5  ?                          mm, not extending between tops of 2 mucosal folds)  ?                          esophagitis with no bleeding was found 36 cm from  ?                          the incisors. Biopsies were taken with a cold  ?                          forceps for histology. Estimated blood loss was  ?                          minimal. ?                          Patchy mild inflammation was found in the gastric  ?                          body. Biopsies were taken with a cold forceps for  ?                          histology. Estimated blood loss was minimal. ?                          The examined duodenum was normal. ?Complications:            No immediate complications. ?Estimated Blood Loss:     Estimated blood loss was minimal. 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  ?  Biopsied. ?                          - Gastritis. Biopsied. ?                          - Normal examined duodenum. ?Recommendation:           - Patient has a contact number available for  ?                          emergencies. The signs and symptoms of potential  ?                          delayed complications were discussed with the  ?                          patient. Return to normal activities tomorrow.  ?                          Written discharge instructions were provided to the  ?                          patient. ?                          - Resume previous diet. ?                          - Continue present medications including  ?                          pantoprazole 40 mg twice daily. ?                          - Await pathology results. ?                          - No aspirin, ibuprofen, naproxen, or other  ?                          non-steroidal anti-inflammatory drugs. ?Thornton Park MD, MD ?11/29/2021 3:55:03 PM ?This report has been signed electronically. ?

## 2021-11-29 NOTE — Progress Notes (Signed)
Pt's states no medical or surgical changes since previsit or office visit. 

## 2021-11-29 NOTE — Patient Instructions (Addendum)
Thank you for letting us take care of your healthcare needs today. ?Please see handouts given to you on Gastritis, Polyps. ?No NSAIDS, Aspirin, Ibuprofen or Advil. ? ? ?YOU HAD AN ENDOSCOPIC PROCEDURE TODAY AT [facility]:   Refer to the procedure report that was given to you for any specific questions about what was found during the examination.  If the procedure report does not answer your questions, please call your gastroenterologist to clarify.  If you requested that your care partner not be given the details of your procedure findings, then the procedure report has been included in a sealed envelope for you to review at your convenience later. ? ?YOU SHOULD EXPECT: Some feelings of bloating in the abdomen. Passage of more gas than usual.  Walking can help get rid of the air that was put into your GI tract during the procedure and reduce the bloating. If you had a lower endoscopy (such as a colonoscopy or flexible sigmoidoscopy) you may notice spotting of blood in your stool or on the toilet paper. If you underwent a bowel prep for your procedure, you may not have a normal bowel movement for a few days. ? ?Please Note:  You might notice some irritation and congestion in your nose or some drainage.  This is from the oxygen used during your procedure.  There is no need for concern and it should clear up in a day or so. ? ?SYMPTOMS TO REPORT IMMEDIATELY: ? ?Following lower endoscopy (colonoscopy or flexible sigmoidoscopy): ? Excessive amounts of blood in the stool ? Significant tenderness or worsening of abdominal pains ? Swelling of the abdomen that is new, acute ? Fever of 100?F or higher ? ?Following upper endoscopy (EGD) ? Vomiting of blood or coffee ground material ? New chest pain or pain under the shoulder blades ? Painful or persistently difficult swallowing ? New shortness of breath ? Fever of 100?F or higher ? Black, tarry-looking stools ? ?For urgent or emergent issues, a  gastroenterologist can be reached at any hour by calling [phone]. ?Do not use MyChart messaging for urgent concerns.  ? ? ?DIET:  We do recommend a small meal at first, but then you may proceed to your regular diet.  Drink plenty of fluids but you should avoid alcoholic beverages for 24 hours. ? ?ACTIVITY:  You should plan to take it easy for the rest of today and you should NOT DRIVE or use heavy machinery until tomorrow (because of the sedation medicines used during the test).   ? ?FOLLOW UP: ?Our staff will call the number listed on your records 48-72 hours following your procedure to check on you and address any questions or concerns that you may have regarding the information given to you following your procedure. If we do not reach you, we will leave a message.  We will attempt to reach you two times.  During this call, we will ask if you have developed any symptoms of COVID 19. If you develop any symptoms (ie: fever, flu-like symptoms, shortness of breath, cough etc.) before then, please call [phone].  If you test positive for Covid 19 in the 2 weeks post procedure, please call and report this information to [place].   ? ?If any biopsies were taken you will be contacted by phone or by letter within the next 1-3 weeks.  Please call us at [phone] if you have not heard about the biopsies in 3 weeks.  ? ? ?SIGNATURES/CONFIDENTIALITY: ?You and/or your  care partner have signed paperwork which will be entered into your electronic medical record.  These signatures attest to the fact that that the information above on your After Visit Summary has been reviewed and is understood.  Full responsibility of the confidentiality of this discharge information lies with you and/or your care-partner.  ?

## 2021-11-30 ENCOUNTER — Other Ambulatory Visit: Payer: Self-pay

## 2021-11-30 ENCOUNTER — Telehealth: Payer: Self-pay

## 2021-11-30 DIAGNOSIS — R195 Other fecal abnormalities: Secondary | ICD-10-CM

## 2021-11-30 DIAGNOSIS — Z8371 Family history of colonic polyps: Secondary | ICD-10-CM

## 2021-11-30 NOTE — Telephone Encounter (Signed)
Pt returned call. Advised 4/25 OV has been canceled per Dr. Tarri Glenn request. Rescheduled colon at [facility] on 12/30/21 @ 130pm. Amb referral placed for auth purposes. Prep instructions sent via My Chart per pt request. Routing this message to Dr. Tarri Glenn to determine if she would like another message sent to cardiology to request hold for Plavix. ?

## 2021-11-30 NOTE — Telephone Encounter (Signed)
Per Dr. Tarri Glenn, pt underwent colon at [facility] on 11/29/21 but prep was poor. Requested pt be rescheduled for next available colon at [facility] with 2 day prep. Requested appt on 01/10/22 be canceled as she believes this was scheduled in error. Per her request, called pt to reschedule colon. LVM requesting returned call. ?

## 2021-12-01 ENCOUNTER — Telehealth: Payer: Self-pay

## 2021-12-01 NOTE — Telephone Encounter (Signed)
Second post procedure follow up call, no answer 

## 2021-12-01 NOTE — Telephone Encounter (Signed)
?  Follow up Call- ? ?Call back number 11/29/2021  ?Post procedure Call Back phone  # [phone]  ?Permission to leave phone message Yes  ?Some recent data might be hidden  ?  ? ?Left message ?

## 2021-12-01 NOTE — Telephone Encounter (Signed)
Yes. If plavix holding is preferred, may hold plavix 5 days before the colonoscopy. ? ?Thanks ?MJP ? ?

## 2021-12-01 NOTE — Telephone Encounter (Signed)
Pt declined OV and will proceed with colonoscopy as scheduled. Awaiting response from Dr. Virgina Jock re: holding Plavix. ?

## 2021-12-04 ENCOUNTER — Encounter: Payer: Self-pay | Admitting: Gastroenterology

## 2021-12-05 ENCOUNTER — Other Ambulatory Visit: Payer: Self-pay | Admitting: Cardiology

## 2021-12-05 ENCOUNTER — Other Ambulatory Visit: Payer: Self-pay | Admitting: Gastroenterology

## 2021-12-05 DIAGNOSIS — I25118 Atherosclerotic heart disease of native coronary artery with other forms of angina pectoris: Secondary | ICD-10-CM

## 2021-12-05 DIAGNOSIS — K259 Gastric ulcer, unspecified as acute or chronic, without hemorrhage or perforation: Secondary | ICD-10-CM

## 2021-12-05 DIAGNOSIS — D500 Iron deficiency anemia secondary to blood loss (chronic): Secondary | ICD-10-CM

## 2021-12-21 ENCOUNTER — Encounter: Payer: Self-pay | Admitting: Gastroenterology

## 2021-12-30 ENCOUNTER — Encounter: Payer: Medicare PPO | Admitting: Gastroenterology

## 2022-01-10 ENCOUNTER — Ambulatory Visit: Payer: Medicare PPO | Admitting: Gastroenterology

## 2022-01-19 ENCOUNTER — Encounter: Payer: Self-pay | Admitting: Gastroenterology

## 2022-01-19 ENCOUNTER — Ambulatory Visit: Payer: Medicare PPO | Admitting: Gastroenterology

## 2022-01-19 VITALS — BP 132/60 | HR 77 | Ht 68.0 in | Wt 176.0 lb

## 2022-01-19 DIAGNOSIS — Z7901 Long term (current) use of anticoagulants: Secondary | ICD-10-CM

## 2022-01-19 DIAGNOSIS — K2100 Gastro-esophageal reflux disease with esophagitis, without bleeding: Secondary | ICD-10-CM

## 2022-01-19 NOTE — Patient Instructions (Addendum)
It was my pleasure to provide care to you today. Based on our discussion, I am providing you with my recommendations below: ? ?RECOMMENDATION(S):  ? ?I have recommended another colonoscopy because there were areas of your colon that I could not see at the time of your colonoscopy in March. Please schedule this appointment at your convenience. We will use a different prep next time.  ? ?We discussed medication use for reflux esophagitis. This class of medications is called proton pump inhibitors. In general, PPIs are considered safe medicines. But they may make the gastrointestinal tract more susceptible to bacterial infections--including a serious diarrheal disease called Clostridium difficile--and may increase the long-term risk of hip fractures and some association with memory loss. Despite these concerns, PPIs are the preferred medication for treating esophagitis and severe cases of reflux. They are also used when you are at risk for bleeding.  I recommend that you stay on the lowest dose possible. I do not recommend that you start these medications suddenly.   ? ? ?FOLLOW UP: ? ?I am recommending that you have a(n) colonoscopy completed. Per your request, my staff did not schedule the procedure(s) today. When you are ready to proceed with scheduling, please contact my office at [phone].  ? ?BMI: ? ?If you are age 73 or older, your body mass index should be between 23-30. Your Body mass index is 26.76 kg/m?Marland Kitchen If this is out of the aforementioned range listed, please consider follow up with your Primary Care Provider. ? ? ?MY CHART: ? ?The [facility] providers would like to encourage you to use [facility] to communicate with providers for non-urgent requests or questions.  Due to long hold times on the telephone, sending your provider a message by [facility] may be a faster and more efficient way to get a response.  Please allow 48 business hours for a response.  Please remember that this is for non-urgent requests.   ? ?Thank you for trusting me with your gastrointestinal care!   ? ?Thornton Park, MD, MPH ? ?

## 2022-01-19 NOTE — Progress Notes (Addendum)
? ?Referring Provider: [name], MD ?Primary Care Physician:  [name], MD ? ? ?Chief complaint: Questions about colonoscopy  ? ? ?IMPRESSION:  ?History of 7 tubular adenomas and poor prep on colonoscopy 11/2021 ?Reflux esophagitis, controlled on PPI ?Chronic use of Plavix and ASA ?Family history of colon polyps (brother) ? ? ?Repeat exam recommended with 2 day bowel prep. ? ?Hold Plavix for 5 days before endoscopy.  I discussed with the patient that there is a low, but real, risk of a cardiovascular event such as heart attack, stroke, or embolism/thrombosis while off Plavix. Will communicate by phone or EMR with patient's prescribing provider to confirm that holding the Plavix is appropriate at this time.   ? ?Discussed the long-term risks of PPI therapy and our goal to use the lowest dose possible. ? ? ? ?PLAN: ?Colonoscopy with 2 day bowel prep after a Plavix washout  ? ? ?HPI: Edgar Garcia is a 71 y.o. male who returns in follow-up. He wanted to discuss the necessity of his recommended surveillance colonoscopy.  ? ?He was initially referred for a positive Cologuard. Colonoscopy delayed due to  cardiac evaluation. He was ultimately admitted to the hospital 07/26/2021 following a complicated cardiac catheterization which resulted in a dissection with cessation of mid to distal LAD flow-associated with angina symptoms. Emergent CABG was not necessary. He was treated with ASA,Tirofiban followed by Brilinta. ECHO showed preserved EF with apical and anteroapical hypokinesis. He was discharged home 07/28/2021 on dual antiplatelet therapy, Amlodipine and Metoprolol.  He was subsequently switched to Plavix in addition to ASA '81mg'$  QD. He developed epigastric burning and he reported passing a normal brown BM with a small amount of bright red blood 07/2021. He was started on Pantoprazole '40mg'$  bid and his epigastric pain has nearly abated and no further rectal bleeding.  ? ?Endoscopic evaluation was  performed 11/29/21.  ? ?Colonoscopy 11/29/21: ?- Preparation of the colon was poor. ?- One 14 mm polyp in the rectum, removed with a hot snare. Resected and retrieved. ?Tattooed. ?- Three 2 to 4 mm polyps in the rectum, removed with a cold snare. Resected and retrieved. ?- Two less than 1 mm polyps in the sigmoid colon, removed with a cold biopsy forceps. ?Resected and retrieved. ?- One 3 mm polyp in the sigmoid colon, removed with a cold snare. Resected and retrieved. ?- One 5 mm polyp at the hepatic flexure, removed with a cold snare. Resected and retrieved. ?- The examination was otherwise normal on direct and retroflexion views. ? ?Pathology showed all tubular adenomas. Repeat colonoscopy with 2 day bowel prep recommended.  ? ?EGD 11/29/21: ?- LA Grade A reflux esophagitis with no bleeding. Biopsies confirmed reflux esophagitis. ?- Gastritis. Biopsies showed reactive gastropathy. Fundic gland polyps present.  ?- Normal examined duodenum. ? ?No further bleeding. Because he is feeling better, he is not sure that he needs to have another colonoscopy. GI ROS is negative.  ? ? ?Brother with colon polyps. There is other no known family history of colon cancer or polyps. No family history of stomach cancer or other GI malignancy. No family history of inflammatory bowel disease or celiac.  ? ? ?Past Medical History:  ?Diagnosis Date  ? Hyperlipidemia   ? Myocardial infarction [facility])   ? TIA (transient ischemic attack)   ? ? ?Past Surgical History:  ?Procedure Laterality Date  ? CORONARY BALLOON ANGIOPLASTY N/A 07/26/2021  ? Procedure: CORONARY BALLOON ANGIOPLASTY;  Surgeon: Nigel Mormon, MD;  Location: [facility]  INVASIVE CV LAB;  Service: Cardiovascular;  Laterality: N/A;  ? LEFT HEART CATH AND CORONARY ANGIOGRAPHY N/A 07/26/2021  ? Procedure: LEFT HEART CATH AND CORONARY ANGIOGRAPHY;  Surgeon: Nigel Mormon, MD;  Location: [place] CV LAB;  Service: Cardiovascular;  Laterality: N/A;  ? ? ? ?Current Outpatient  Medications  ?Medication Sig Dispense Refill  ? amLODipine (NORVASC) 5 MG tablet Take 5 mg by mouth in the morning.    ? aspirin 81 MG EC tablet Take 81 mg by mouth in the morning.    ? atorvastatin (LIPITOR) 40 MG tablet TAKE 1 TABLET(40 MG) BY MOUTH EVERY EVENING 30 tablet 2  ? clopidogrel (PLAVIX) 75 MG tablet TAKE 1 TABLET(75 MG) BY MOUTH DAILY 30 tablet 3  ? lisinopril (ZESTRIL) 10 MG tablet TAKE 1 TABLET(10 MG) BY MOUTH DAILY 90 tablet 0  ? metoprolol succinate (TOPROL-XL) 25 MG 24 hr tablet TAKE 1 TABLET(25 MG) BY MOUTH DAILY WITH OR IMMEDIATELY FOLLOWING A MEAL 90 tablet 1  ? pantoprazole (PROTONIX) 40 MG tablet TAKE 1 TABLET(40 MG) BY MOUTH TWICE DAILY 60 tablet 3  ? ranolazine (RANEXA) 500 MG 12 hr tablet Take 1 tablet (500 mg total) by mouth 2 (two) times daily. 60 tablet 3  ? vitamin B-12 (CYANOCOBALAMIN) 1000 MCG tablet Take 1,000 mcg by mouth in the morning.    ? nitroGLYCERIN (NITROSTAT) 0.4 MG SL tablet Place 1 tablet (0.4 mg total) under the tongue every 5 (five) minutes as needed for chest pain. 90 tablet 3  ? ?No current facility-administered medications for this visit.  ? ? ?Allergies as of 01/19/2022 - Review Complete 01/19/2022  ?Allergen Reaction Noted  ? Brilinta [ticagrelor] Other (See Comments) 08/25/2021  ? Isordil [isosorbide nitrate] Other (See Comments) 08/25/2021  ? ? ?Family History  ?Problem Relation Age of Onset  ? Cancer Mother   ? Hypertension Sister   ? Hypertension Sister   ? Hypertension Sister   ? Colon polyps Brother   ? Hypertension Brother   ? Colon cancer Neg Hx   ? ? ? ? ?Physical Exam: ?General:   Alert,  well-nourished, pleasant and cooperative in NAD ?Head:  Normocephalic and atraumatic. ?Eyes:  Sclera clear, no icterus.   Conjunctiva pink. ?Ears:  Normal auditory acuity. ?Nose:  No deformity, discharge,  or lesions. ?Mouth:  No deformity or lesions.   ?Neck:  Supple; no masses or thyromegaly. ?Lungs:  Clear throughout to auscultation.   No wheezes. ?Heart:  Regular  rate and rhythm; no murmurs. ?Abdomen:  Soft, nontender, nondistended, normal bowel sounds, no rebound or guarding. No hepatosplenomegaly.   ?Rectal:  Deferred  ?Msk:  Symmetrical. No boney deformities ?LAD: No inguinal or umbilical LAD ?Extremities:  No clubbing or edema. ?Neurologic:  Alert and  oriented x4;  grossly nonfocal ?Skin:  Intact without significant lesions or rashes. ?Psych:  Alert and cooperative. Normal mood and affect. ? ?I spent 35  minutes, including in depth chart review, independent review of results, communicating results with the patient directly, face-to-face time with the patient, coordinating care, and ordering studies and medications as appropriate, and documentation.  ? ? ?Taedyn Glasscock L. Tarri Glenn, MD, MPH ?01/19/2022, 6:59 PM ? ? ? ?  ?

## 2022-01-23 ENCOUNTER — Other Ambulatory Visit: Payer: Self-pay | Admitting: Cardiology

## 2022-01-23 DIAGNOSIS — I2510 Atherosclerotic heart disease of native coronary artery without angina pectoris: Secondary | ICD-10-CM

## 2022-01-25 ENCOUNTER — Ambulatory Visit: Payer: Medicare PPO | Admitting: Cardiology

## 2022-01-30 ENCOUNTER — Telehealth: Payer: Self-pay

## 2022-01-30 NOTE — Telephone Encounter (Signed)
Yes. It would be okay.

## 2022-01-30 NOTE — Telephone Encounter (Signed)
Pt called back, pt is aware.

## 2022-02-13 ENCOUNTER — Other Ambulatory Visit: Payer: Self-pay | Admitting: Cardiology

## 2022-02-13 DIAGNOSIS — I10 Essential (primary) hypertension: Secondary | ICD-10-CM

## 2022-02-22 ENCOUNTER — Encounter: Payer: Self-pay | Admitting: Cardiology

## 2022-02-22 ENCOUNTER — Ambulatory Visit: Payer: Medicare PPO | Admitting: Cardiology

## 2022-02-22 VITALS — BP 159/83 | HR 68 | Temp 98.9°F | Resp 16 | Ht 68.0 in | Wt 178.0 lb

## 2022-02-22 DIAGNOSIS — I2510 Atherosclerotic heart disease of native coronary artery without angina pectoris: Secondary | ICD-10-CM

## 2022-02-22 DIAGNOSIS — I10 Essential (primary) hypertension: Secondary | ICD-10-CM

## 2022-02-22 DIAGNOSIS — E782 Mixed hyperlipidemia: Secondary | ICD-10-CM

## 2022-02-22 NOTE — Progress Notes (Signed)
Patient referred by [name], MD for exertional chest pain  Subjective:   Edgar Garcia, male    DOB: [birth year], 71 y.o.   MRN: [id number]   Chief Complaint  Patient presents with   Coronary Artery Disease   Follow-up    3 months     HPI  71 year old male with hypertension, hyperlipidemia, prediabetes, prior h/o heavy alcohol use, now with CAD s/p unsuccessful percutaneous revascularization complicated by coronary dissection and peri-procedural MI (07/26/2021), positive stool occult blood test  Patient is walking at least a mile a day. He still has occasional episodes of retrosternal burning, but it does not always correlate with exertion. Blood pressure, as always, is elevated in office-but better at home. He continues to have dizziness and tinnitus. On a separate note, he also reports bilateral hip and leg pain, but usually gets better with walking.   GI DR. Beavers: History of 7 tubular adenomas and poor prep on colonoscopy 11/2021 Reflux esophagitis, controlled on PPI Chronic use of Plavix and ASA Family history of colon polyps (brother)     Repeat exam recommended with 2 day bowel prep.  Initial consultation HPI 06/2021: Patient is here with his wife today.  He is a Curator, works in Scientific laboratory technician.  He has had hypertension hyperlipidemia for 9 years, but he was.  Most with a TIA episode.  He since been on lisinopril 40 mg daily, amlodipine 10 mg daily, aspirin 81 mg daily, Crestor 10 mg daily.  He stays active, walking 2-3 miles several days a week.  Recently, he has noted episodes of personal burning sensation occurs at top of the head of the hill, improves with rest.  Episodes occur on a regular basis, and fairly impressive up with the symptoms or physical activity.  Blood pressure is elevated today, but usually much lower than this.  He notices that blood pressure is always elevated physician visits, and much lower at home.  He denies any recurrent TIA,  stroke symptoms since his 1 episode of TIA 9 years ago.  He denies any shortness of breath, palpitation, orthopnea, PND symptoms.  On a separate note, he recently tested positive for colitis.  He has an upcoming appointment with gastroenterology next week.   Current Outpatient Medications:    amLODipine (NORVASC) 5 MG tablet, Take 5 mg by mouth in the morning., Disp: , Rfl:    aspirin 81 MG EC tablet, Take 81 mg by mouth in the morning., Disp: , Rfl:    atorvastatin (LIPITOR) 40 MG tablet, TAKE 1 TABLET(40 MG) BY MOUTH EVERY EVENING, Disp: 30 tablet, Rfl: 2   clopidogrel (PLAVIX) 75 MG tablet, TAKE 1 TABLET(75 MG) BY MOUTH DAILY, Disp: 30 tablet, Rfl: 3   lisinopril (ZESTRIL) 10 MG tablet, TAKE 1 TABLET(10 MG) BY MOUTH DAILY, Disp: 90 tablet, Rfl: 0   metoprolol succinate (TOPROL-XL) 25 MG 24 hr tablet, TAKE 1 TABLET(25 MG) BY MOUTH DAILY WITH OR IMMEDIATELY FOLLOWING A MEAL, Disp: 90 tablet, Rfl: 1   nitroGLYCERIN (NITROSTAT) 0.4 MG SL tablet, Place 1 tablet (0.4 mg total) under the tongue every 5 (five) minutes as needed for chest pain., Disp: 90 tablet, Rfl: 3   pantoprazole (PROTONIX) 40 MG tablet, TAKE 1 TABLET(40 MG) BY MOUTH TWICE DAILY, Disp: 60 tablet, Rfl: 3   ranolazine (RANEXA) 500 MG 12 hr tablet, TAKE 1 TABLET(500 MG) BY MOUTH TWICE DAILY, Disp: 60 tablet, Rfl: 3   vitamin B-12 (CYANOCOBALAMIN) 1000 MCG tablet, Take 1,000  mcg by mouth in the morning., Disp: , Rfl:     Cardiovascular and other pertinent studies:  EKG 10/26/2021: Sinus rhythm 58 bpm Nonspecific T-abnormality  Echocardiogram 10/13/2021:  Normal LV systolic function with visual EF 55-60%. Left ventricle cavity  is normal in size. Normal left ventricular wall thickness. Normal global  wall motion. Normal diastolic filling pattern, normal LAP.  Aortic valve sclerosis without stenosis. Trace aortic regurgitation.  Mild (Grade I) mitral regurgitation.  Mild tricuspid regurgitation. No evidence of pulmonary  hypertension.  Compared to study 07/27/2021: LVEF remains preserved, no obvious RWMA on  current study, G1DD is now normal, otherwise no significant change.   EKG 08/22/2021: Sinus rhythm 70 bpm Frequent PVCs  Anterolateral T wave inversion, less pronounced compare to previous EKG  Echocardiogram 07/27/2021:  1. Left ventricular ejection fraction, by estimation, is 65 to 70%. The  left ventricle has normal function. The left ventricle demonstrates  regional wall motion abnormalities-mild hypokinesis in apical/anterior  apical myocardium. Left ventricular  diastolic parameters are consistent with Grade I diastolic dysfunction  (impaired relaxation). These changes are new compared previous outpatient  study in 06/2021.   2. There is mildly elevated pulmonary artery systolic pressure at 39  mmHg.   3. The inferior vena cava is normal in size with greater than 50%  respiratory variability, suggesting right atrial pressure of 3 mmHg.   Coronary intervention 07/26/2021: LM: Normal LAD: Prox 70-75% severe calcific disease         Mid LAD focal 95% stenosis Lcx: Dominant. No significant disease RCA: Non-ominant. Proximal 90% stenosis   Unsuccessful percutaneous coronary intervention mid LAD Dissection and partial vessel closure Ventricular fibrillation requiring emergent defibrillation Patient hemodynamically and electrically stable   Monitor in ICU overnight. Recommend Aspirin/Aggrastat. Provided mo clinical deterioration overnight requiring emergency CABG (less likely), will load with Brilint ain AM.    Will consider repeat angiography and intervention in 3-4 weeks after healing of dissection.  Exercise Tetrofosmin stress test 07/12/2021: Exercise nuclear stress test was performed using Bruce protocol. Patient reached 4.6 METS, and 90% of age predicted maximum heart rate. Exercise capacity was low. No chest pain reported, dyspnea and dizziness reported. Heart rate and hemodynamic  response were normal. Stress EKG showed sinus tachycardia, nonspecific T wave inversion leads III, aVF, 1-1.5 mm upsloping ST depression in leads V5, V6 that normalize 1 min into recovery. SPECT images showed small sized, medium intensity, reversible perfusion defect in apical anterior, apical myocardium.  TID 1.23. Stress LVEF 80%.  High risk study due to regional perfusion deficit with TID >1.2.   EKG 06/29/2021: Sinus rhythm 84 bpm baseline artifact   Recent labs: 07/29/2021: Glucose 127, BUN/Cr 7/0.78. EGFR 95. Na/K 134/3.9. Rest of the CMP normal H/H 14/43. MCV 98. Platelets 352 HbA1C 5.6% Chol 118, TG 50, HDL 50, LDL 58  06/14/2021: Glucose 96, BUN/Cr 7/0.72. EGFR >90. Na/K 143/4.2. Albumin 5.1. Rest of the CMP normal H/H 16/47. MCV 99.7. Platelets 288 HbA1C 5.5% Chol 164, TG 64, HDL 69, LDL 84 TSH N/A   Review of Systems  Cardiovascular:  Positive for chest pain. Negative for dyspnea on exertion, leg swelling, palpitations and syncope.  Musculoskeletal:        Shoulder discomfort  Gastrointestinal:        Positive Cologuard test, upcoming visit with gastroenterology.        Vitals:   02/22/22 1119  BP: (!) 159/83  Pulse: 68  Resp: 16  Temp: 98.9 F (37.2 C)  SpO2: 100%  Body mass index is 27.06 kg/m. Filed Weights   02/22/22 1119  Weight: 178 lb (80.7 kg)       Objective:   Physical Exam Vitals and nursing note reviewed.  Constitutional:      General: He is not in acute distress. Neck:     Vascular: No JVD.  Cardiovascular:     Rate and Rhythm: Normal rate and regular rhythm.     Heart sounds: Normal heart sounds. No murmur heard. Pulmonary:     Effort: Pulmonary effort is normal.     Breath sounds: Normal breath sounds. No wheezing or rales.  Musculoskeletal:     Right lower leg: No edema.     Left lower leg: No edema.         Assessment & Recommendations:    71 year old male with hypertension, hyperlipidemia, prediabetes, prior  h/o heavy alcohol use, now with CAD s/p unsuccessful percutaneous revascularization complicated by coronary dissection and peri-procedural MI (07/26/2021), positive stool occult blood test  CAD: Increasing exercise capacity, but stable class II angina symptoms His EKG and echocardiogram have normalized.  Continue Aspirin and plavix till 07/2022. However, both can be held prior to colonoscopy up to 5 days.  Given dizziness, stopped metoprolol. Increase Ranexa to 1000 mg bid.  Given his controlled CAD symptoms, I think it is reasonable to proceed with repeat colonoscopy with acceptable cardiac risk.  Mixed hyperlipidemia: Continue Lipitor 40 mg. LDL down to 58 (07/2021)  Hypertension:  Fairly well controlled.  Blood pressure is much lower at home.    Dizziness: Given his tinnitus, suspect ENT etiology.  F/u in 3 months    Nigel Mormon, MD Pager: [phone] Office: [phone]

## 2022-03-04 ENCOUNTER — Other Ambulatory Visit: Payer: Self-pay | Admitting: Gastroenterology

## 2022-03-04 DIAGNOSIS — D500 Iron deficiency anemia secondary to blood loss (chronic): Secondary | ICD-10-CM

## 2022-03-04 DIAGNOSIS — K259 Gastric ulcer, unspecified as acute or chronic, without hemorrhage or perforation: Secondary | ICD-10-CM

## 2022-03-10 ENCOUNTER — Other Ambulatory Visit: Payer: Self-pay | Admitting: Cardiology

## 2022-04-08 ENCOUNTER — Other Ambulatory Visit: Payer: Self-pay | Admitting: Cardiology

## 2022-04-08 DIAGNOSIS — I25118 Atherosclerotic heart disease of native coronary artery with other forms of angina pectoris: Secondary | ICD-10-CM

## 2022-04-20 ENCOUNTER — Other Ambulatory Visit: Payer: Self-pay | Admitting: Cardiology

## 2022-04-20 DIAGNOSIS — I209 Angina pectoris, unspecified: Secondary | ICD-10-CM

## 2022-05-17 ENCOUNTER — Other Ambulatory Visit: Payer: Self-pay | Admitting: Cardiology

## 2022-05-17 DIAGNOSIS — I2510 Atherosclerotic heart disease of native coronary artery without angina pectoris: Secondary | ICD-10-CM

## 2022-05-17 DIAGNOSIS — I10 Essential (primary) hypertension: Secondary | ICD-10-CM

## 2022-06-04 ENCOUNTER — Other Ambulatory Visit: Payer: Self-pay | Admitting: Cardiology

## 2022-07-19 ENCOUNTER — Other Ambulatory Visit: Payer: Self-pay

## 2022-07-20 ENCOUNTER — Ambulatory Visit: Payer: Medicare PPO | Admitting: Cardiology

## 2022-07-20 ENCOUNTER — Encounter: Payer: Self-pay | Admitting: Cardiology

## 2022-07-20 VITALS — BP 168/90 | HR 76 | Temp 97.8°F | Resp 16 | Ht 68.0 in | Wt 176.4 lb

## 2022-07-20 DIAGNOSIS — I10 Essential (primary) hypertension: Secondary | ICD-10-CM

## 2022-07-20 DIAGNOSIS — I2510 Atherosclerotic heart disease of native coronary artery without angina pectoris: Secondary | ICD-10-CM

## 2022-07-20 DIAGNOSIS — E782 Mixed hyperlipidemia: Secondary | ICD-10-CM

## 2022-07-20 MED ORDER — RANOLAZINE ER 1000 MG PO TB12: 1000.0000 mg | ORAL_TABLET | Freq: Two times a day (BID) | ORAL | 3 refills | Status: DC

## 2022-07-20 MED ORDER — RANOLAZINE ER 500 MG PO TB12: 1000.0000 mg | ORAL_TABLET | Freq: Two times a day (BID) | ORAL | 3 refills | Status: DC

## 2022-07-20 MED ORDER — LISINOPRIL 20 MG PO TABS: 20.0000 mg | ORAL_TABLET | Freq: Every day | ORAL | 3 refills | Status: DC

## 2022-07-20 NOTE — Progress Notes (Signed)
Patient referred by [name], MD for exertional chest pain  Subjective:   Edgar Garcia, male    DOB: [birth year], 71 y.o.   MRN: [id number]   Chief Complaint  Patient presents with   Medication Management          HPI  71 y/o with hypertension, hyperlipidemia, prediabetes, prior h/o heavy alcohol use, now with CAD s/p unsuccessful percutaneous revascularization complicated by coronary dissection and peri-procedural MI (07/26/2021), positive stool occult blood test  Patient is doing well. He has not had any exertional chest pain in spite of walking uphill. Blood pressure has increased after stopping metoprolol, even though dizziness has nearly resolved.   Initial consultation HPI 06/2021: Patient is here with his wife today.  He is a Curator, works in Scientific laboratory technician.  He has had hypertension hyperlipidemia for 9 years, but he was.  Most with a TIA episode.  He since been on lisinopril 40 mg daily, amlodipine 10 mg daily, aspirin 81 mg daily, Crestor 10 mg daily.  He stays active, walking 2-3 miles several days a week.  Recently, he has noted episodes of personal burning sensation occurs at top of the head of the hill, improves with rest.  Episodes occur on a regular basis, and fairly impressive up with the symptoms or physical activity.  Blood pressure is elevated today, but usually much lower than this.  He notices that blood pressure is always elevated physician visits, and much lower at home.  He denies any recurrent TIA, stroke symptoms since his 1 episode of TIA 9 years ago.  He denies any shortness of breath, palpitation, orthopnea, PND symptoms.  On a separate note, he recently tested positive for colitis.  He has an upcoming appointment with gastroenterology next week.   Current Outpatient Medications:    amLODipine (NORVASC) 5 MG tablet, Take 5 mg by mouth in the morning., Disp: , Rfl:    aspirin 81 MG EC tablet, Take 81 mg by mouth in the morning., Disp: ,  Rfl:    atorvastatin (LIPITOR) 40 MG tablet, TAKE 1 TABLET(40 MG) BY MOUTH EVERY EVENING, Disp: 30 tablet, Rfl: 2   clopidogrel (PLAVIX) 75 MG tablet, TAKE 1 TABLET(75 MG) BY MOUTH DAILY, Disp: 30 tablet, Rfl: 3   nitroGLYCERIN (NITROSTAT) 0.4 MG SL tablet, Place 1 tablet (0.4 mg total) under the tongue every 5 (five) minutes as needed for chest pain., Disp: 90 tablet, Rfl: 3   pantoprazole (PROTONIX) 40 MG tablet, TAKE 1 TABLET(40 MG) BY MOUTH TWICE DAILY, Disp: 180 tablet, Rfl: 1   ranolazine (RANEXA) 500 MG 12 hr tablet, TAKE 1 TABLET(500 MG) BY MOUTH TWICE DAILY (Patient taking differently: Take 1,000 mg by mouth 2 (two) times daily.), Disp: 60 tablet, Rfl: 3   vitamin B-12 (CYANOCOBALAMIN) 1000 MCG tablet, Take 1,000 mcg by mouth in the morning., Disp: , Rfl:    lisinopril (ZESTRIL) 10 MG tablet, TAKE 1 TABLET(10 MG) BY MOUTH DAILY, Disp: 90 tablet, Rfl: 0    Cardiovascular and other pertinent studies:  EKG 07/20/2022: Sinus rhythm 71 bpm Occasional PAC    Echocardiogram 10/13/2021:  Normal LV systolic function with visual EF 55-60%. Left ventricle cavity  is normal in size. Normal left ventricular wall thickness. Normal global  wall motion. Normal diastolic filling pattern, normal LAP.  Aortic valve sclerosis without stenosis. Trace aortic regurgitation.  Mild (Grade I) mitral regurgitation.  Mild tricuspid regurgitation. No evidence of pulmonary hypertension.  Compared to study 07/27/2021: LVEF  remains preserved, no obvious RWMA on  current study, G1DD is now normal, otherwise no significant change.   Echocardiogram 07/27/2021:  1. Left ventricular ejection fraction, by estimation, is 65 to 70%. The  left ventricle has normal function. The left ventricle demonstrates  regional wall motion abnormalities-mild hypokinesis in apical/anterior  apical myocardium. Left ventricular  diastolic parameters are consistent with Grade I diastolic dysfunction  (impaired relaxation). These  changes are new compared previous outpatient  study in 06/2021.   2. There is mildly elevated pulmonary artery systolic pressure at 39  mmHg.   3. The inferior vena cava is normal in size with greater than 50%  respiratory variability, suggesting right atrial pressure of 3 mmHg.   Coronary intervention 07/26/2021: LM: Normal LAD: Prox 70-75% severe calcific disease         Mid LAD focal 95% stenosis Lcx: Dominant. No significant disease RCA: Non-ominant. Proximal 90% stenosis   Unsuccessful percutaneous coronary intervention mid LAD Dissection and partial vessel closure Ventricular fibrillation requiring emergent defibrillation Patient hemodynamically and electrically stable   Monitor in ICU overnight. Recommend Aspirin/Aggrastat. Provided mo clinical deterioration overnight requiring emergency CABG (less likely), will load with Brilint ain AM.    Will consider repeat angiography and intervention in 3-4 weeks after healing of dissection.  Exercise Tetrofosmin stress test 07/12/2021: Exercise nuclear stress test was performed using Bruce protocol. Patient reached 4.6 METS, and 90% of age predicted maximum heart rate. Exercise capacity was low. No chest pain reported, dyspnea and dizziness reported. Heart rate and hemodynamic response were normal. Stress EKG showed sinus tachycardia, nonspecific T wave inversion leads III, aVF, 1-1.5 mm upsloping ST depression in leads V5, V6 that normalize 1 min into recovery. SPECT images showed small sized, medium intensity, reversible perfusion defect in apical anterior, apical myocardium.  TID 1.23. Stress LVEF 80%.  High risk study due to regional perfusion deficit with TID >1.2.   EKG 06/29/2021: Sinus rhythm 84 bpm baseline artifact   Recent labs: 07/29/2021: Glucose 127, BUN/Cr 7/0.78. EGFR 95. Na/K 134/3.9. Rest of the CMP normal H/H 14/43. MCV 98. Platelets 352 HbA1C 5.6% Chol 118, TG 50, HDL 50, LDL 58  06/14/2021: Glucose 96, BUN/Cr  7/0.72. EGFR >90. Na/K 143/4.2. Albumin 5.1. Rest of the CMP normal H/H 16/47. MCV 99.7. Platelets 288 HbA1C 5.5% Chol 164, TG 64, HDL 69, LDL 84 TSH N/A   Review of Systems  Cardiovascular:  Positive for chest pain. Negative for dyspnea on exertion, leg swelling, palpitations and syncope.  Musculoskeletal:        Shoulder discomfort  Gastrointestinal:        Positive Cologuard test, upcoming visit with gastroenterology.         Vitals:   07/20/22 1116 07/20/22 1121  BP: (!) 170/89 (!) 168/90  Pulse: 76   Resp: 16   Temp: 97.8 F (36.6 C)   SpO2: 99%     Body mass index is 26.82 kg/m. Filed Weights   07/20/22 1116  Weight: 176 lb 6.4 oz (80 kg)       Objective:   Physical Exam Vitals and nursing note reviewed.  Constitutional:      General: He is not in acute distress. Neck:     Vascular: No JVD.  Cardiovascular:     Rate and Rhythm: Normal rate and regular rhythm.     Heart sounds: Normal heart sounds. No murmur heard. Pulmonary:     Effort: Pulmonary effort is normal.     Breath sounds: Normal  breath sounds. No wheezing or rales.  Musculoskeletal:     Right lower leg: No edema.     Left lower leg: No edema.          Assessment & Recommendations:    71 y/o with hypertension, hyperlipidemia, prediabetes, prior h/o heavy alcohol use, now with CAD s/p unsuccessful percutaneous revascularization complicated by coronary dissection and peri-procedural MI (07/26/2021), positive stool occult blood test  CAD: Increasing exercise capacity, improved angina symptoms Stop plavix now. Continue Aspirin 81 mg daily. Continue Ranexa to 1000 mg bid.  Given his controlled CAD symptoms, I think it is reasonable to proceed with repeat colonoscopy with acceptable cardiac risk.  Mixed hyperlipidemia: Continue Lipitor 40 mg. LDL down to 58 (07/2021)  Hypertension:  Increased lisinopril to 20 mg daily. Check BMP in 1 week.  Dizziness: Improved after stopping  metoprolol  F/u in 3 months    Nigel Mormon, MD Pager: [phone] Office: [phone]

## 2022-07-28 LAB — BASIC METABOLIC PANEL
BUN/Creatinine Ratio: 12 (ref 10–24)
BUN: 9 mg/dL (ref 8–27)
CO2: 24 mmol/L (ref 20–29)
Calcium: 8.9 mg/dL (ref 8.6–10.2)
Chloride: 96 mmol/L (ref 96–106)
Creatinine, Ser: 0.75 mg/dL — ABNORMAL LOW (ref 0.76–1.27)
Glucose: 104 mg/dL — ABNORMAL HIGH (ref 70–99)
Potassium: 4.7 mmol/L (ref 3.5–5.2)
Sodium: 134 mmol/L (ref 134–144)
eGFR: 95 mL/min/{1.73_m2} (ref >59)

## 2022-07-28 LAB — LIPID PANEL
Chol/HDL Ratio: 1.9 ratio (ref 0.0–5.0)
Cholesterol, Total: 128 mg/dL (ref 100–199)
HDL: 67 mg/dL (ref >39)
LDL Chol Calc (NIH): 48 mg/dL (ref 0–99)
Triglycerides: 58 mg/dL (ref 0–149)
VLDL Cholesterol Cal: 13 mg/dL (ref 5–40)

## 2022-08-03 ENCOUNTER — Ambulatory Visit: Payer: Medicare PPO | Admitting: Cardiology

## 2022-08-13 ENCOUNTER — Other Ambulatory Visit: Payer: Self-pay | Admitting: Cardiology

## 2022-08-13 DIAGNOSIS — I25118 Atherosclerotic heart disease of native coronary artery with other forms of angina pectoris: Secondary | ICD-10-CM

## 2022-09-09 ENCOUNTER — Other Ambulatory Visit: Payer: Self-pay | Admitting: Cardiology

## 2022-10-01 ENCOUNTER — Other Ambulatory Visit: Payer: Self-pay | Admitting: Gastroenterology

## 2022-10-01 DIAGNOSIS — D500 Iron deficiency anemia secondary to blood loss (chronic): Secondary | ICD-10-CM

## 2022-10-01 DIAGNOSIS — K259 Gastric ulcer, unspecified as acute or chronic, without hemorrhage or perforation: Secondary | ICD-10-CM

## 2022-10-05 ENCOUNTER — Emergency Department (HOSPITAL_COMMUNITY)
Admission: EM | Admit: 2022-10-05 | Discharge: 2022-10-05 | Disposition: A | Discharge status: Home / Self Care | Payer: Medicare PPO | Attending: Emergency Medicine | Admitting: Emergency Medicine

## 2022-10-05 ENCOUNTER — Emergency Department (HOSPITAL_COMMUNITY): Payer: Medicare PPO

## 2022-10-05 ENCOUNTER — Encounter (HOSPITAL_COMMUNITY): Payer: Self-pay

## 2022-10-05 DIAGNOSIS — I951 Orthostatic hypotension: Secondary | ICD-10-CM | POA: Diagnosis not present

## 2022-10-05 DIAGNOSIS — E871 Hypo-osmolality and hyponatremia: Secondary | ICD-10-CM | POA: Diagnosis not present

## 2022-10-05 DIAGNOSIS — R55 Syncope and collapse: Secondary | ICD-10-CM | POA: Diagnosis present

## 2022-10-05 DIAGNOSIS — I2510 Atherosclerotic heart disease of native coronary artery without angina pectoris: Secondary | ICD-10-CM | POA: Diagnosis not present

## 2022-10-05 DIAGNOSIS — Z7982 Long term (current) use of aspirin: Secondary | ICD-10-CM | POA: Insufficient documentation

## 2022-10-05 LAB — TROPONIN I (HIGH SENSITIVITY)
Troponin I (High Sensitivity): 4 ng/L (ref <18)
Troponin I (High Sensitivity): 4 ng/L (ref <18)

## 2022-10-05 LAB — BASIC METABOLIC PANEL
Anion gap: 10 (ref 5–15)
BUN: 6 mg/dL — ABNORMAL LOW (ref 8–23)
CO2: 26 mmol/L (ref 22–32)
Calcium: 8.7 mg/dL — ABNORMAL LOW (ref 8.9–10.3)
Chloride: 94 mmol/L — ABNORMAL LOW (ref 98–111)
Creatinine, Ser: 0.81 mg/dL (ref 0.61–1.24)
GFR, Estimated: >60 mL/min (ref >60)
Glucose, Bld: 91 mg/dL (ref 70–99)
Potassium: 3.9 mmol/L (ref 3.5–5.1)
Sodium: 130 mmol/L — ABNORMAL LOW (ref 135–145)

## 2022-10-05 LAB — CBC WITH DIFFERENTIAL/PLATELET
Abs Immature Granulocytes: 0.15 10*3/uL — ABNORMAL HIGH (ref 0.00–0.07)
Basophils Absolute: 0 10*3/uL (ref 0.0–0.1)
Basophils Relative: 0 %
Eosinophils Absolute: 0.1 10*3/uL (ref 0.0–0.5)
Eosinophils Relative: 1 %
HCT: 40.5 % (ref 39.0–52.0)
Hemoglobin: 14.6 g/dL (ref 13.0–17.0)
Immature Granulocytes: 1 %
Lymphocytes Relative: 8 %
Lymphs Abs: 0.8 10*3/uL (ref 0.7–4.0)
MCH: 35.3 pg — ABNORMAL HIGH (ref 26.0–34.0)
MCHC: 36 g/dL (ref 30.0–36.0)
MCV: 97.8 fL (ref 80.0–100.0)
Monocytes Absolute: 1 10*3/uL (ref 0.1–1.0)
Monocytes Relative: 9 %
Neutro Abs: 8.5 10*3/uL — ABNORMAL HIGH (ref 1.7–7.7)
Neutrophils Relative %: 81 %
Platelets: 269 10*3/uL (ref 150–400)
RBC: 4.14 MIL/uL — ABNORMAL LOW (ref 4.22–5.81)
RDW: 11.7 % (ref 11.5–15.5)
WBC: 10.5 10*3/uL (ref 4.0–10.5)
nRBC: 0 % (ref 0.0–0.2)

## 2022-10-05 MED ORDER — SODIUM CHLORIDE 0.9 % IV BOLUS
1000.0000 mL | Freq: Once | INTRAVENOUS | Status: AC
  Administered 2022-10-05: 1000 mL via INTRAVENOUS

## 2022-10-05 NOTE — ED Notes (Signed)
Patient had no complaints of dizziness during orthostatic VS.

## 2022-10-05 NOTE — Discharge Instructions (Signed)
You are seen in the emergency department for a syncopal event and low blood pressure.  You had blood work EKG chest x-ray that did not show an obvious explanation for your symptoms.  You were given IV fluids and your blood pressure remained stable.  Please continue to stay well-hydrated follow-up with your primary care doctor and cardiologist.  Return to the emergency department if any worsening or concerning symptoms

## 2022-10-05 NOTE — ED Triage Notes (Signed)
Patient BIB by EMS. 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 Once getting home EMS reports he had 2-3 syncopal episodes. Patient does not remember the episodes and does not know if he hit head. Patient has no history of syncopal episodes. Patient is A&Ox4.

## 2022-10-05 NOTE — ED Provider Notes (Signed)
[facility] EMERGENCY DEPARTMENT Provider Note   CSN: [id number] Arrival date & time: 10/05/22  1623     History  Chief Complaint  Patient presents with   Loss of Consciousness    Edgar Garcia is a 72 y.o. male.  He is presenting by EMS after a syncopal event.  He said he was at the dentist for 2 or 3 hours and had not eaten much today.  Was at his studio working on paper project when he started feeling lightheaded.  Decided to drive home but getting out of his vehicle he stumbled and fell to the ground.  A neighbor tried to help him up which led to a syncopal event.  He said he was incontinent of urine.  He is feeling better now although a little bit tired.  He said he deals with some chronic achiness in his neck and shoulders.  He denies any chest pain shortness of breath abdominal pain vomiting diarrhea.  No fevers or chills.  He does have cardiac history and was recently taken off of beta-blockers due to intolerance.  The history is provided by the patient.  Loss of Consciousness Episode history:  Single Most recent episode:  Today Progression:  Partially resolved Chronicity:  New Context: standing up   Witnessed: yes   Relieved by:  Lying down Worsened by:  Nothing Ineffective treatments:  None tried Associated symptoms: malaise/fatigue and recent fall   Associated symptoms: no chest pain, no difficulty breathing, no fever, no focal weakness, no nausea, no shortness of breath and no vomiting   Risk factors: coronary artery disease        Home Medications Prior to Admission medications   Medication Sig Start Date End Date Taking? Authorizing Provider  amLODipine (NORVASC) 5 MG tablet Take 5 mg by mouth in the morning. 06/14/21   [provider]  aspirin 81 MG EC tablet Take 81 mg by mouth in the morning. 10/19/15   [provider]  atorvastatin (LIPITOR) 40 MG tablet TAKE 1 TABLET(40 MG) BY MOUTH EVERY EVENING 09/13/22   Patwardhan, Manish  J, MD  lisinopril (ZESTRIL) 20 MG tablet Take 1 tablet (20 mg total) by mouth daily. 07/20/22   Patwardhan, Reynold Bowen, MD  nitroGLYCERIN (NITROSTAT) 0.4 MG SL tablet Place 1 tablet (0.4 mg total) under the tongue every 5 (five) minutes as needed for chest pain. 06/29/21 07/20/22  Patwardhan, Reynold Bowen, MD  pantoprazole (PROTONIX) 40 MG tablet TAKE 1 TABLET(40 MG) BY MOUTH TWICE DAILY 10/02/22   Thornton Park, MD  ranolazine (RANEXA) 1000 MG SR tablet Take 1 tablet (1,000 mg total) by mouth 2 (two) times daily. 07/20/22   Patwardhan, Reynold Bowen, MD  vitamin B-12 (CYANOCOBALAMIN) 1000 MCG tablet Take 1,000 mcg by mouth in the morning.    [provider]      Allergies    Brilinta [ticagrelor] and Isordil [isosorbide nitrate]    Review of Systems   Review of Systems  Constitutional:  Positive for malaise/fatigue. Negative for fever.  Respiratory:  Negative for shortness of breath.   Cardiovascular:  Positive for syncope. Negative for chest pain.  Gastrointestinal:  Negative for nausea and vomiting.  Musculoskeletal:  Positive for neck pain.  Neurological:  Positive for syncope. Negative for focal weakness.    Physical Exam Updated Vital Signs BP (!) 147/73 (BP Location: Right Arm)   Pulse 63   Temp (!) 97.4 F (36.3 C) (Oral)   Resp 16   Ht '5\' 11"'$  (  1.803 m)   Wt 78 kg   SpO2 100%   BMI 23.99 kg/m  Physical Exam Vitals and nursing note reviewed.  Constitutional:      General: He is not in acute distress.    Appearance: Normal appearance. He is well-developed.  HENT:     Head: Normocephalic and atraumatic.  Eyes:     Conjunctiva/sclera: Conjunctivae normal.  Cardiovascular:     Rate and Rhythm: Normal rate and regular rhythm.     Heart sounds: No murmur heard. Pulmonary:     Effort: Pulmonary effort is normal. No respiratory distress.     Breath sounds: Normal breath sounds.  Abdominal:     Palpations: Abdomen is soft.     Tenderness: There is no abdominal  tenderness. There is no guarding or rebound.  Musculoskeletal:        General: No deformity.     Cervical back: Neck supple.     Right lower leg: No edema.     Left lower leg: No edema.  Skin:    General: Skin is warm and dry.     Capillary Refill: Capillary refill takes less than 2 seconds.  Neurological:     General: No focal deficit present.     Mental Status: He is alert and oriented to person, place, and time.     Cranial Nerves: No cranial nerve deficit.     Sensory: No sensory deficit.     Motor: No weakness.     ED Results / Procedures / Treatments   Labs (all labs ordered are listed, but only abnormal results are displayed) Labs Reviewed  BASIC METABOLIC PANEL - Abnormal; Notable for the following components:      Result Value   Sodium 130 (*)    Chloride 94 (*)    BUN 6 (*)    Calcium 8.7 (*)    All other components within normal limits  CBC WITH DIFFERENTIAL/PLATELET - Abnormal; Notable for the following components:   RBC 4.14 (*)    MCH 35.3 (*)    Neutro Abs 8.5 (*)    Abs Immature Granulocytes 0.15 (*)    All other components within normal limits  TROPONIN I (HIGH SENSITIVITY)  TROPONIN I (HIGH SENSITIVITY)    EKG EKG Interpretation  Date/Time:  Thursday October 05 2022 16:25:35 EST Ventricular Rate:  66 PR Interval:  43 QRS Duration: 107 QT Interval:  427 QTC Calculation: 448 R Axis:   55 Text Interpretation: Sinus rhythm Short PR interval improved anterior t wave inversions from prior 11/22 Confirmed by Aletta Edouard [phone]) on 10/05/2022 4:33:46 PM  Radiology DG Chest Port 1 View  Result Date: 10/05/2022 CLINICAL DATA:  syncope EXAM: PORTABLE CHEST 1 VIEW COMPARISON:  July 27, 2021 FINDINGS: The cardiomediastinal silhouette is unchanged in contour.Atherosclerotic calcifications. No pleural effusion. No pneumothorax. No acute pleuroparenchymal abnormality. IMPRESSION: No acute cardiopulmonary abnormality. Electronically Signed   By: Valentino Saxon M.D.   On: 10/05/2022 17:53    Procedures Procedures    Medications Ordered in ED Medications  sodium chloride 0.9 % bolus 1,000 mL (0 mLs Intravenous Stopped 10/05/22 1923)    ED Course/ Medical Decision Making/ A&P Clinical Course as of 10/06/22 0951  Thu Oct 05, 2022  1756 Chest x-ray interpreted by me as no acute infiltrate.  Awaiting radiology reading. [MB]    Clinical Course User Index [MB] Hayden Rasmussen, MD  Medical Decision Making Amount and/or Complexity of Data Reviewed Labs: ordered. Radiology: ordered. ECG/medicine tests: ordered.   This patient complains of syncopal event low blood pressure; this involves an extensive number of treatment Options and is a complaint that carries with it a high risk of complications and morbidity. The differential includes syncope, arrhythmia, hypovolemia, orthostatic, metabolic derangement, ACS, infection  I ordered, reviewed and interpreted labs, which included CBC with normal white count normal hemoglobin, chemistries with mildly low sodium normal renal function, troponins flat I ordered medication IV fluids and reviewed PMP when indicated. I ordered imaging studies which included chest x-ray and I independently    visualized and interpreted imaging which showed no acute findings Additional history obtained from EMS and patient's wife Previous records obtained and reviewed in epic no recent admissions  Cardiac monitoring reviewed, normal sinus rhythm Social determinants considered, no significant barriers Critical Interventions: None  After the interventions stated above, I reevaluated the patient and found patient to be symptomatically improved.  No further dizziness and orthostatics negative. Admission and further testing considered, no indications for admission at this time.  Patient comfortable plan for discharge and keeping well-hydrated following up with PCP and cardiology.  Return  instructions discussed.         Final Clinical Impression(s) / ED Diagnoses Final diagnoses:  Syncope and collapse  Orthostatic hypotension    Rx / DC Orders ED Discharge Orders     None         Hayden Rasmussen, MD 10/06/22 [phone]

## 2022-10-23 ENCOUNTER — Ambulatory Visit: Payer: Medicare PPO | Admitting: Cardiology

## 2022-10-23 ENCOUNTER — Encounter: Payer: Self-pay | Admitting: Cardiology

## 2022-10-23 VITALS — BP 161/88 | HR 56 | Resp 16 | Ht 71.0 in | Wt 176.0 lb

## 2022-10-23 DIAGNOSIS — E871 Hypo-osmolality and hyponatremia: Secondary | ICD-10-CM

## 2022-10-23 DIAGNOSIS — R55 Syncope and collapse: Secondary | ICD-10-CM | POA: Insufficient documentation

## 2022-10-23 DIAGNOSIS — I10 Essential (primary) hypertension: Secondary | ICD-10-CM

## 2022-10-23 DIAGNOSIS — I2510 Atherosclerotic heart disease of native coronary artery without angina pectoris: Secondary | ICD-10-CM

## 2022-10-23 DIAGNOSIS — E782 Mixed hyperlipidemia: Secondary | ICD-10-CM

## 2022-10-23 NOTE — Progress Notes (Signed)
Patient referred by Edgar Major, MD for exertional chest pain  Subjective:   Edgar Garcia, male    DOB: 1950-12-27, 72 y.o.   MRN: [id number]   Chief Complaint  Patient presents with   Coronary Artery Disease   Follow-up    3 month     HPI  72 y/o with hypertension, hyperlipidemia, prediabetes, prior h/o heavy alcohol use, now with CAD s/p unsuccessful percutaneous revascularization complicated by coronary dissection and peri-procedural MI (07/26/2021), positive stool occult blood test  Patient is here for follow-up.  He continues his 45-minute walks regularly.  He does report heaviness in his throat, with radiation to shoulder and arms on mostly on the right side.  The symptoms occur at peak of his exertion and resolve almost immediately after slowing down.  He has not had to take any nitroglycerin.  Overall, he does admit that he has limited his physical activity in order to avoid having any chest pain symptoms.  Patient was seen in [facility] emergency room 2 weeks ago.  He was at his dentist office for 2-1/2 hours, and had not had anything to drink.  On finishing up with a dentist, he felt lightheaded, but try to drive back home.  As he was getting to his home, he felt lightheaded and collapsed on the curb.  EMS were called, and he was found to be hypotensive.  Blood pressure improved with hydration.  He was taken to [facility] emergency room.  Serial troponins were negative, there was no significant arrhythmia.  BMP showed sodium of 130.   Initial consultation HPI 06/2021: Patient is here with his wife today.  He is a Curator, works in [employer] laboratory technician.  He has had hypertension hyperlipidemia for 9 years, but he was.  Most with a TIA episode.  He since been on lisinopril 40 mg daily, amlodipine 10 mg daily, aspirin 81 mg daily, Crestor 10 mg daily.  He stays active, walking 2-3 miles several days a week.  Recently, he has noted episodes of personal burning sensation  occurs at top of the head of the hill, improves with rest.  Episodes occur on a regular basis, and fairly impressive up with the symptoms or physical activity.  Blood pressure is elevated today, but usually much lower than this.  He notices that blood pressure is always elevated physician visits, and much lower at home.  He denies any recurrent TIA, stroke symptoms since his 1 episode of TIA 9 years ago.  He denies any shortness of breath, palpitation, orthopnea, PND symptoms.  On a separate note, he recently tested positive for colitis.  He has an upcoming appointment with gastroenterology next week.   Current Outpatient Medications:    amLODipine (NORVASC) 5 MG tablet, Take 5 mg by mouth in the morning., Disp: , Rfl:    aspirin 81 MG EC tablet, Take 81 mg by mouth in the morning., Disp: , Rfl:    atorvastatin (LIPITOR) 40 MG tablet, TAKE 1 TABLET(40 MG) BY MOUTH EVERY EVENING, Disp: 30 tablet, Rfl: 2   lisinopril (ZESTRIL) 20 MG tablet, Take 1 tablet (20 mg total) by mouth daily., Disp: 90 tablet, Rfl: 3   nitroGLYCERIN (NITROSTAT) 0.4 MG SL tablet, Place 1 tablet (0.4 mg total) under the tongue every 5 (five) minutes as needed for chest pain., Disp: 90 tablet, Rfl: 3   pantoprazole (PROTONIX) 40 MG tablet, TAKE 1 TABLET(40 MG) BY MOUTH TWICE DAILY, Disp: 180 tablet, Rfl: 1  ranolazine (RANEXA) 1000 MG SR tablet, Take 1 tablet (1,000 mg total) by mouth 2 (two) times daily., Disp: 90 tablet, Rfl: 3   vitamin B-12 (CYANOCOBALAMIN) 1000 MCG tablet, Take 1,000 mcg by mouth in the morning., Disp: , Rfl:     Cardiovascular and other pertinent studies:  EKG 07/20/2022: Sinus rhythm 71 bpm Occasional PAC    Echocardiogram 10/13/2021:  Normal LV systolic function with visual EF 55-60%. Left ventricle cavity  is normal in size. Normal left ventricular wall thickness. Normal global  wall motion. Normal diastolic filling pattern, normal LAP.  Aortic valve sclerosis without stenosis. Trace aortic  regurgitation.  Mild (Grade I) mitral regurgitation.  Mild tricuspid regurgitation. No evidence of pulmonary hypertension.  Compared to study 07/27/2021: LVEF remains preserved, no obvious RWMA on  current study, G1DD is now normal, otherwise no significant change.   Echocardiogram 07/27/2021:  1. Left ventricular ejection fraction, by estimation, is 65 to 70%. The  left ventricle has normal function. The left ventricle demonstrates  regional wall motion abnormalities-mild hypokinesis in apical/anterior  apical myocardium. Left ventricular  diastolic parameters are consistent with Grade I diastolic dysfunction  (impaired relaxation). These changes are new compared previous outpatient  study in 06/2021.   2. There is mildly elevated pulmonary artery systolic pressure at 39  mmHg.   3. The inferior vena cava is normal in size with greater than 50%  respiratory variability, suggesting right atrial pressure of 3 mmHg.   Coronary intervention 07/26/2021: LM: Normal LAD: Prox 70-75% severe calcific disease         Mid LAD focal 95% stenosis Lcx: Dominant. No significant disease RCA: Non-ominant. Proximal 90% stenosis   Unsuccessful percutaneous coronary intervention mid LAD Dissection and partial vessel closure Ventricular fibrillation requiring emergent defibrillation Patient hemodynamically and electrically stable   Monitor in ICU overnight. Recommend Aspirin/Aggrastat. Provided mo clinical deterioration overnight requiring emergency CABG (less likely), will load with Brilint ain AM.    Will consider repeat angiography and intervention in 3-4 weeks after healing of dissection.  Exercise Tetrofosmin stress test 07/12/2021: Exercise nuclear stress test was performed using Bruce protocol. Patient reached 4.6 METS, and 90% of age predicted maximum heart rate. Exercise capacity was low. No chest pain reported, dyspnea and dizziness reported. Heart rate and hemodynamic response were  normal. Stress EKG showed sinus tachycardia, nonspecific T wave inversion leads III, aVF, 1-1.5 mm upsloping ST depression in leads V5, V6 that normalize 1 min into recovery. SPECT images showed small sized, medium intensity, reversible perfusion defect in apical anterior, apical myocardium.  TID 1.23. Stress LVEF 80%.  High risk study due to regional perfusion deficit with TID >1.2.   Recent labs: 10/05/2022: Glucose 91, BUN/Cr 6/0.81. EGFR >60. Na/K 130/3.9.  H/H 14/40. MCV 97. Platelets 269 Trop HS 4, 4  07/29/2021: Glucose 127, BUN/Cr 7/0.78. EGFR 95. Na/K 134/3.9. Rest of the CMP normal H/H 14/43. MCV 98. Platelets 352 HbA1C 5.6% Chol 118, TG 50, HDL 50, LDL 58  06/14/2021: Glucose 96, BUN/Cr 7/0.72. EGFR >90. Na/K 143/4.2. Albumin 5.1. Rest of the CMP normal H/H 16/47. MCV 99.7. Platelets 288 HbA1C 5.5% Chol 164, TG 64, HDL 69, LDL 84 TSH N/A   Review of Systems  Cardiovascular:  Positive for chest pain. Negative for dyspnea on exertion, leg swelling, palpitations and syncope.  Musculoskeletal:        Shoulder discomfort  Gastrointestinal:        Positive Cologuard test, upcoming visit with gastroenterology.  Vitals:   10/23/22 1027 10/23/22 1030  BP: (!) 161/89 (!) 161/88  Pulse: 61 (!) 56  Resp: 16   SpO2: 96%     Body mass index is 24.55 kg/m. Filed Weights   10/23/22 1027  Weight: 176 lb (79.8 kg)       Objective:   Physical Exam Vitals and nursing note reviewed.  Constitutional:      General: He is not in acute distress. Neck:     Vascular: No JVD.  Cardiovascular:     Rate and Rhythm: Normal rate and regular rhythm.     Heart sounds: Normal heart sounds. No murmur heard. Pulmonary:     Effort: Pulmonary effort is normal.     Breath sounds: Normal breath sounds. No wheezing or rales.  Musculoskeletal:     Right lower leg: No edema.     Left lower leg: No edema.         Assessment & Recommendations:    72 y/o with  hypertension, hyperlipidemia, prediabetes, prior h/o heavy alcohol use, now with CAD s/p unsuccessful percutaneous revascularization complicated by coronary dissection and peri-procedural MI (07/26/2021), positive stool occult blood test  Syncope: Episode on 10/06/2022.  In absence of arrhythmia or elevated troponin, presence of hypotension, this was most likely related to dehydration.  Encourage liberal hydration.  I will repeat his BMP today to follow-up on his sodium level.  CAD: Class III angina symptoms, almost always reproduced with certain level of physical exertion. Continue aspirin 81 mg daily, Ranexa 1000 mg twice daily.,  Lipitor 40 mg daily. Not on beta-blocker due to dizziness. 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  He has had 1 previous attempt of LAD revascularization that unfortunately was aborted due to dissection.  Should patient have lifestyle limiting symptoms of angina, would recommend repeat coronary angiography.  Options for revascularization at that time would include repeat attempt at percutaneous intervention, versus consideration for bypass surgery.  Patient will keep me updated regarding his subjective level of anginal symptom tolerance.   Mixed hyperlipidemia: Continue Lipitor 40 mg. LDL down to 58 (07/2021)  Hypertension:  Component of whitecoat hypertension.  Blood pressure well-controlled, and even low at times, at home.    Dizziness: Improved after stopping metoprolol  F/u in 6 weeks  Time spent: 40-minute   Nigel Mormon, MD Pager: [phone] Office: [phone]

## 2022-10-27 LAB — BASIC METABOLIC PANEL
BUN/Creatinine Ratio: 10 (ref 10–24)
BUN: 7 mg/dL — ABNORMAL LOW (ref 8–27)
CO2: 25 mmol/L (ref 20–29)
Calcium: 9.2 mg/dL (ref 8.6–10.2)
Chloride: 93 mmol/L — ABNORMAL LOW (ref 96–106)
Creatinine, Ser: 0.68 mg/dL — ABNORMAL LOW (ref 0.76–1.27)
Glucose: 102 mg/dL — ABNORMAL HIGH (ref 70–99)
Potassium: 4.7 mmol/L (ref 3.5–5.2)
Sodium: 132 mmol/L — ABNORMAL LOW (ref 134–144)
eGFR: 98 mL/min/{1.73_m2} (ref >59)

## 2022-12-01 ENCOUNTER — Ambulatory Visit: Payer: Medicare PPO | Admitting: Cardiology

## 2022-12-01 ENCOUNTER — Encounter: Payer: Self-pay | Admitting: Cardiology

## 2022-12-01 VITALS — BP 142/82 | HR 72 | Resp 16 | Ht 71.0 in | Wt 174.0 lb

## 2022-12-01 DIAGNOSIS — I25118 Atherosclerotic heart disease of native coronary artery with other forms of angina pectoris: Secondary | ICD-10-CM

## 2022-12-01 DIAGNOSIS — I739 Peripheral vascular disease, unspecified: Secondary | ICD-10-CM | POA: Insufficient documentation

## 2022-12-01 MED ORDER — ATENOLOL 25 MG PO TABS: 25.0000 mg | ORAL_TABLET | Freq: Two times a day (BID) | ORAL | 3 refills | Status: DC

## 2022-12-01 NOTE — Progress Notes (Signed)
Patient referred by [name], MD for exertional chest pain  Subjective:   Edgar Garcia, male    DOB: Feb 22, 1951, 72 y.o.   MRN: [id number]   Chief Complaint  Patient presents with   Coronary Artery Disease   Follow-up     HPI  72 y/o with hypertension, hyperlipidemia, prediabetes, prior h/o heavy alcohol use, now with CAD s/p unsuccessful percutaneous revascularization complicated by coronary dissection and peri-procedural MI (07/26/2021), positive stool occult blood test  For the past two weeks, patient has tried to walk more as the weather has gotten warmer. He has not had any particularly limiting chest pain symptoms. He does report calf pain on walking up the hill, improves with rest.   Initial consultation HPI 06/2021: Patient is here with his wife today.  He is a Curator, works in [employer] laboratory technician.  He has had hypertension hyperlipidemia for 9 years, but he was.  Most with a TIA episode.  He since been on lisinopril 40 mg daily, amlodipine 10 mg daily, aspirin 81 mg daily, Crestor 10 mg daily.  He stays active, walking 2-3 miles several days a week.  Recently, he has noted episodes of personal burning sensation occurs at top of the head of the hill, improves with rest.  Episodes occur on a regular basis, and fairly impressive up with the symptoms or physical activity.  Blood pressure is elevated today, but usually much lower than this.  He notices that blood pressure is always elevated physician visits, and much lower at home.  He denies any recurrent TIA, stroke symptoms since his 1 episode of TIA 9 years ago.  He denies any shortness of breath, palpitation, orthopnea, PND symptoms.  On a separate note, he recently tested positive for colitis.  He has an upcoming appointment with gastroenterology next week.   Current Outpatient Medications:    amLODipine (NORVASC) 5 MG tablet, Take 5 mg by mouth in the morning., Disp: , Rfl:    aspirin 81 MG EC tablet, Take 81  mg by mouth in the morning., Disp: , Rfl:    atorvastatin (LIPITOR) 40 MG tablet, TAKE 1 TABLET(40 MG) BY MOUTH EVERY EVENING, Disp: 30 tablet, Rfl: 2   lisinopril (ZESTRIL) 20 MG tablet, Take 1 tablet (20 mg total) by mouth daily., Disp: 90 tablet, Rfl: 3   nitroGLYCERIN (NITROSTAT) 0.4 MG SL tablet, Place 1 tablet (0.4 mg total) under the tongue every 5 (five) minutes as needed for chest pain., Disp: 90 tablet, Rfl: 3   pantoprazole (PROTONIX) 40 MG tablet, TAKE 1 TABLET(40 MG) BY MOUTH TWICE DAILY, Disp: 180 tablet, Rfl: 1   ranolazine (RANEXA) 1000 MG SR tablet, Take 1 tablet (1,000 mg total) by mouth 2 (two) times daily., Disp: 90 tablet, Rfl: 3   vitamin B-12 (CYANOCOBALAMIN) 1000 MCG tablet, Take 1,000 mcg by mouth in the morning., Disp: , Rfl:     Cardiovascular and other pertinent studies:  EKG 07/20/2022: Sinus rhythm 71 bpm Occasional PAC    Echocardiogram 10/13/2021:  Normal LV systolic function with visual EF 55-60%. Left ventricle cavity  is normal in size. Normal left ventricular wall thickness. Normal global  wall motion. Normal diastolic filling pattern, normal LAP.  Aortic valve sclerosis without stenosis. Trace aortic regurgitation.  Mild (Grade I) mitral regurgitation.  Mild tricuspid regurgitation. No evidence of pulmonary hypertension.  Compared to study 07/27/2021: LVEF remains preserved, no obvious RWMA on  current study, G1DD is now normal, otherwise no significant change.  Echocardiogram 07/27/2021:  1. Left ventricular ejection fraction, by estimation, is 65 to 70%. The  left ventricle has normal function. The left ventricle demonstrates  regional wall motion abnormalities-mild hypokinesis in apical/anterior  apical myocardium. Left ventricular  diastolic parameters are consistent with Grade I diastolic dysfunction  (impaired relaxation). These changes are new compared previous outpatient  study in 06/2021.   2. There is mildly elevated pulmonary artery  systolic pressure at 39  mmHg.   3. The inferior vena cava is normal in size with greater than 50%  respiratory variability, suggesting right atrial pressure of 3 mmHg.   Coronary intervention 07/26/2021: LM: Normal LAD: Prox 70-75% severe calcific disease         Mid LAD focal 95% stenosis Lcx: Dominant. No significant disease RCA: Non-ominant. Proximal 90% stenosis   Unsuccessful percutaneous coronary intervention mid LAD Dissection and partial vessel closure Ventricular fibrillation requiring emergent defibrillation Patient hemodynamically and electrically stable   Monitor in ICU overnight. Recommend Aspirin/Aggrastat. Provided mo clinical deterioration overnight requiring emergency CABG (less likely), will load with Brilint ain AM.    Will consider repeat angiography and intervention in 3-4 weeks after healing of dissection.  Exercise Tetrofosmin stress test 07/12/2021: Exercise nuclear stress test was performed using Bruce protocol. Patient reached 4.6 METS, and 90% of age predicted maximum heart rate. Exercise capacity was low. No chest pain reported, dyspnea and dizziness reported. Heart rate and hemodynamic response were normal. Stress EKG showed sinus tachycardia, nonspecific T wave inversion leads III, aVF, 1-1.5 mm upsloping ST depression in leads V5, V6 that normalize 1 min into recovery. SPECT images showed small sized, medium intensity, reversible perfusion defect in apical anterior, apical myocardium.  TID 1.23. Stress LVEF 80%.  High risk study due to regional perfusion deficit with TID >1.2.   Recent labs: 10/26/2022: Glucose 102, BUN/Cr 7/0.68. EGFR 98. Na/K 132/4.7  10/05/2022: Glucose 91, BUN/Cr 6/0.81. EGFR >60. Na/K 130/3.9.  H/H 14/40. MCV 97. Platelets 269 Trop HS 4, 4  07/27/2022: Chol 128, TG 58, HDL 67, LDL 48   Review of Systems  Cardiovascular:  Negative for chest pain, dyspnea on exertion, leg swelling, palpitations and syncope.         Vitals:    12/01/22 0944  BP: (!) 142/82  Pulse: 72  Resp: 16  SpO2: 99%    Body mass index is 24.27 kg/m. Filed Weights   12/01/22 0944  Weight: 174 lb (78.9 kg)       Objective:   Physical Exam Vitals and nursing note reviewed.  Constitutional:      General: He is not in acute distress. Neck:     Vascular: No JVD.  Cardiovascular:     Rate and Rhythm: Normal rate and regular rhythm.     Heart sounds: Normal heart sounds. No murmur heard. Pulmonary:     Effort: Pulmonary effort is normal.     Breath sounds: Normal breath sounds. No wheezing or rales.  Musculoskeletal:     Right lower leg: No edema.     Left lower leg: No edema.          Assessment & Recommendations:    72 y/o with hypertension, hyperlipidemia, prediabetes, prior h/o heavy alcohol use, now with CAD s/p unsuccessful percutaneous revascularization complicated by coronary dissection and peri-procedural MI (07/26/2021), positive stool occult blood test  CAD: He has random short lasting episodes of chest, shoulder pain for a few seconds. However, he has not had any significant limiting exertional chest pain  symptoms.  Given that blood pressure is now creeping up. He did not tolerate metoprolol due to dizziness. I will add atenolol 25 mg daily.  Continue aspirin 81 mg daily, Ranexa 1000 mg twice daily, Lipitor 40 mg daily.  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  He has had 1 previous attempt of LAD revascularization that unfortunately was aborted due to dissection.  Should patient have lifestyle limiting symptoms of angina, would recommend repeat coronary angiography.  Options for revascularization at that time would include repeat attempt at percutaneous intervention, versus consideration for bypass surgery.  Patient will keep me updated regarding his subjective level of anginal symptom tolerance.  Okay to proceed with pending colonoscopy with gastroenterology.   Claudication: Check LEA duplex.  Encouraged walking.  Continue risk factor modification.  Mixed hyperlipidemia: Continue Lipitor 40 mg. Chol 128, TG 58, HDL 67, LDL 48 (07/2022)  Hypertension:  As above  F/u in 3 months    Nigel Mormon, MD Pager: [phone] Office: [phone]

## 2022-12-04 ENCOUNTER — Ambulatory Visit: Payer: Medicare PPO | Admitting: Cardiology

## 2022-12-10 ENCOUNTER — Other Ambulatory Visit: Payer: Self-pay | Admitting: Cardiology

## 2022-12-13 ENCOUNTER — Telehealth: Payer: Self-pay | Admitting: Gastroenterology

## 2022-12-13 NOTE — Telephone Encounter (Signed)
Informed Edgar Garcia at patient's PCP he is overdue for his colonoscopy. Caryl Pina states he is coming into the office next week and they update his chart and get him referred back to [place].

## 2022-12-13 NOTE — Telephone Encounter (Signed)
Inbound call from [facility] from patient  PCP Office wanted to know when patient is due for his Colonoscopy.Please call her at [phone]

## 2023-01-18 ENCOUNTER — Other Ambulatory Visit: Payer: Self-pay | Admitting: Cardiology

## 2023-01-18 DIAGNOSIS — I2510 Atherosclerotic heart disease of native coronary artery without angina pectoris: Secondary | ICD-10-CM

## 2023-01-21 ENCOUNTER — Other Ambulatory Visit: Payer: Self-pay | Admitting: Cardiology

## 2023-01-21 DIAGNOSIS — I2510 Atherosclerotic heart disease of native coronary artery without angina pectoris: Secondary | ICD-10-CM

## 2023-02-19 ENCOUNTER — Ambulatory Visit (AMBULATORY_SURGERY_CENTER): Payer: Medicare PPO

## 2023-02-19 VITALS — Ht 71.0 in | Wt 170.0 lb

## 2023-02-19 DIAGNOSIS — Z86010 Personal history of colonic polyps: Secondary | ICD-10-CM

## 2023-02-19 MED ORDER — NA SULFATE-K SULFATE-MG SULF 17.5-3.13-1.6 GM/177ML PO SOLN: 1.0000 | Freq: Once | ORAL | 0 refills | Status: AC

## 2023-02-19 NOTE — Progress Notes (Signed)
No egg or soy allergy known to patient  No issues known to pt with past sedation with any surgeries or procedures Patient denies ever being told they had issues or difficulty with intubation  No FH of Malignant Hyperthermia Pt is not on diet pills Pt is not on  home 02  Pt is not on blood thinners  Pt denies issues with constipation  No A fib or A flutter Have any cardiac testing pending--no Pt instructed to use [email or web address] or GoodRx for a price reduction on prep   

## 2023-02-21 ENCOUNTER — Encounter: Payer: Self-pay | Admitting: Neurology

## 2023-02-23 ENCOUNTER — Encounter: Payer: Self-pay | Admitting: Neurology

## 2023-02-23 ENCOUNTER — Ambulatory Visit: Payer: Medicare PPO | Admitting: Neurology

## 2023-02-23 VITALS — Ht 68.0 in | Wt 174.5 lb

## 2023-02-23 DIAGNOSIS — G44229 Chronic tension-type headache, not intractable: Secondary | ICD-10-CM

## 2023-02-23 DIAGNOSIS — M542 Cervicalgia: Secondary | ICD-10-CM | POA: Diagnosis not present

## 2023-02-23 DIAGNOSIS — R42 Dizziness and giddiness: Secondary | ICD-10-CM | POA: Diagnosis not present

## 2023-02-23 NOTE — Progress Notes (Unsigned)
[facility]  PATIENT: Edgar Garcia DOB: 08-Dec-1950  REQUESTING CLINICIAN: Gwenlyn Found, MD HISTORY FROM: Patient  REASON FOR VISIT: Dizziness, Headaches    HISTORICAL  CHIEF COMPLAINT:  Chief Complaint  Patient presents with   New Patient (Initial Visit)    Patient in room #12 and alone. Patient states he been having some dizziness and feeling lite headed but he believe its because his Rx medication.    HISTORY OF PRESENT ILLNESS:  This is a 72 year old gentleman past medical history of hypertension, hyperlipidemia, CAD, previous TIA and B12 deficiency who is presenting with complaint of headaches and dizziness.  He reports the dizziness is intermittent, he personally feels that it is related to his anti-hypertensive medication.  He is currently on amlodipine and lisinopril but he believes that since starting the lisinopril, he has been more dizziness upon bending over.  He denies any fall, said that he feels the dizziness more so when he bend over but again no dizziness with walking, standing or changing position other than bending over.   On top of that he is also complaining of headaches.  He reports for the past year and a half he has headaches that start in the back of the head, and radiates to the top.  These headaches occur daily, he learned to live with it per patient.  He is not taking any medications for his headaches, he has not tried Tylenol or ibuprofen. He is also reporting insomnia for the past 10 years.  He reports he can fall asleep but wakes up multiple times to use the bathroom.  He has not tried any medication for overactive bladder.     OTHER MEDICAL CONDITIONS: Hypertension, B12 deficiency, Hyperlipidemia, TIA, CAD   REVIEW OF SYSTEMS: Full 14 system review of systems performed and negative with exception of: As noted in the HPI   ALLERGIES: Allergies  Allergen Reactions   Brilinta [Ticagrelor] Other (See Comments)    Headaches     Isordil [Isosorbide Nitrate] Other (See Comments)    Headaches    HOME MEDICATIONS: Outpatient Medications Prior to Visit  Medication Sig Dispense Refill   amLODipine (NORVASC) 5 MG tablet Take 5 mg by mouth in the morning.     aspirin 81 MG EC tablet Take 81 mg by mouth in the morning.     atorvastatin (LIPITOR) 40 MG tablet TAKE 1 TABLET(40 MG) BY MOUTH EVERY EVENING 30 tablet 2   lisinopril (ZESTRIL) 20 MG tablet Take 1 tablet (20 mg total) by mouth daily. 90 tablet 3   pantoprazole (PROTONIX) 40 MG tablet TAKE 1 TABLET(40 MG) BY MOUTH TWICE DAILY 180 tablet 1   ranolazine (RANEXA) 1000 MG SR tablet TAKE 1 TABLET(1000 MG) BY MOUTH TWICE DAILY 90 tablet 3   vitamin B-12 (CYANOCOBALAMIN) 1000 MCG tablet Take 1,000 mcg by mouth in the morning.     atenolol (TENORMIN) 25 MG tablet Take 1 tablet (25 mg total) by mouth 2 (two) times daily. (Patient not taking: Reported on 02/19/2023) 90 tablet 3   nitroGLYCERIN (NITROSTAT) 0.4 MG SL tablet Place 1 tablet (0.4 mg total) under the tongue every 5 (five) minutes as needed for chest pain. 90 tablet 3   No facility-administered medications prior to visit.    PAST MEDICAL HISTORY: Past Medical History:  Diagnosis Date   Anxiety    CAD (coronary artery disease)    GERD (gastroesophageal reflux disease)    Hyperlipidemia    Hypertension    Myocardial  infarction (HCC)    TIA (transient ischemic attack)     PAST SURGICAL HISTORY: Past Surgical History:  Procedure Laterality Date   CORONARY BALLOON ANGIOPLASTY N/A 07/26/2021   Procedure: CORONARY BALLOON ANGIOPLASTY;  Surgeon: Elder Negus, MD;  Location: [facility];  Service: Cardiovascular;  Laterality: N/A;   LEFT HEART CATH AND CORONARY ANGIOGRAPHY N/A 07/26/2021   Procedure: LEFT HEART CATH AND CORONARY ANGIOGRAPHY;  Surgeon: Elder Negus, MD;  Location: [facility];  Service: Cardiovascular;  Laterality: N/A;    FAMILY HISTORY: Family History  Problem  Relation Age of Onset   Cancer Mother    Hypertension Sister    Hypertension Sister    Hypertension Sister    Colon polyps Brother    Hypertension Brother    Colon cancer Neg Hx    Esophageal cancer Neg Hx    Rectal cancer Neg Hx    Stomach cancer Neg Hx     SOCIAL HISTORY: Social History   Socioeconomic History   Marital status: Married    Spouse name: Not on file   Number of children: 3   Years of education: Not on file   Highest education level: Not on file  Occupational History   Not on file  Tobacco Use   Smoking status: Former    Packs/day: 0.50    Years: 25.00    Additional pack years: 0.00    Total pack years: 12.50    Types: Cigarettes    Quit date: 2013    Years since quitting: 11.4   Smokeless tobacco: Never  Vaping Use   Vaping Use: Never used  Substance and Sexual Activity   Alcohol use: Not Currently    Comment: stopped November 2022   Drug use: No   Sexual activity: Not on file  Other Topics Concern   Not on file  Social History Narrative   Not on file   Social Determinants of Health   Financial Resource Strain: Not on file  Food Insecurity: Not on file  Transportation Needs: Not on file  Physical Activity: Not on file  Stress: Not on file  Social Connections: Not on file  Intimate Partner Violence: Not on file     PHYSICAL EXAM   GENERAL EXAM/CONSTITUTIONAL: Vitals:  Vitals:   02/23/23 1020  Weight: 174 lb 8 oz (79.2 kg)  Height: 5\' 8"  (1.727 m)   Body mass index is 26.53 kg/m. Wt Readings from Last 3 Encounters:  02/23/23 174 lb 8 oz (79.2 kg)  02/19/23 170 lb (77.1 kg)  12/01/22 174 lb (78.9 kg)   Patient is in no distress; well developed, nourished and groomed; neck is supple  MUSCULOSKELETAL: Gait, strength, tone, movements noted in Neurologic exam below  NEUROLOGIC: MENTAL STATUS:      No data to display         awake, alert, oriented to person, place and time recent and remote memory intact normal  attention and concentration language fluent, comprehension intact, naming intact fund of knowledge appropriate  CRANIAL NERVE:  2nd, 3rd, 4th, 6th - Visual fields full to confrontation, extraocular muscles intact, no nystagmus 5th - facial sensation symmetric 7th - facial strength symmetric 8th - hearing intact 9th - palate elevates symmetrically, uvula midline 11th - shoulder shrug symmetric 12th - tongue protrusion midline  MOTOR:  normal bulk and tone, full strength in the BUE, BLE  SENSORY:  normal and symmetric to light touch  COORDINATION:  finger-nose-finger, fine finger movements normal  GAIT/STATION:  normal     DIAGNOSTIC DATA (LABS, IMAGING, TESTING) - I reviewed patient records, labs, notes, testing and imaging myself where available.  Lab Results  Component Value Date   WBC 10.5 10/05/2022   HGB 14.6 10/05/2022   HCT 40.5 10/05/2022   MCV 97.8 10/05/2022   PLT 269 10/05/2022      Component Value Date/Time   NA 132 (L) 10/26/2022 0820   K 4.7 10/26/2022 0820   CL 93 (L) 10/26/2022 0820   CO2 25 10/26/2022 0820   GLUCOSE 102 (H) 10/26/2022 0820   GLUCOSE 91 10/05/2022 1727   BUN 7 (L) 10/26/2022 0820   CREATININE 0.68 (L) 10/26/2022 0820   CALCIUM 9.2 10/26/2022 0820   PROT 7.5 03/27/2012 1446   ALBUMIN 4.7 03/27/2012 1446   AST 25 03/27/2012 1446   ALT 21 03/27/2012 1446   ALKPHOS 59 03/27/2012 1446   BILITOT 0.6 03/27/2012 1446   GFRNONAA >60 10/05/2022 1727   GFRAA >90 03/27/2012 1446   Lab Results  Component Value Date   CHOL 128 07/27/2022   HDL 67 07/27/2022   LDLCALC 48 07/27/2022   TRIG 58 07/27/2022   CHOLHDL 1.9 07/27/2022   Lab Results  Component Value Date   HGBA1C 5.6 07/27/2021   No results found for: "VITAMINB12" No results found for: "TSH"    ASSESSMENT AND PLAN  72 y.o. year old male with history including hypertension, hyperlipidemia, TIA, CAD who is presenting with occasional dizziness only when bending over.   He denies any room spinning sensation, denies any fall or gait abnormality. His orthostatic vitals were also normal.  He is also complaining of posterior headaches, for the past year and a half.  Plan for now is to obtain a MRI brain to rule out intracranial abnormality that can cause the headaches but also obtain a cervical spine MRI to rule out cervical radiculopathy.  Since he has not tried any medications for the pain, I did advise him to at least try over-the-counter Tylenol or ibuprofen as needed for the headache.  I will contact him to go over the MRI result otherwise he can continue to follow-up with his PCP.     1. Chronic tension-type headache, not intractable   2. Cervicalgia   3. Dizziness      Patient Instructions  MRI brain without contrast MRI cervical spine without contrast Consider ibuprofen or Tylenol for the headaches Continue to follow with PCP Return as needed.  Orders Placed This Encounter  Procedures   MR BRAIN WO CONTRAST   MR CERVICAL SPINE WO CONTRAST    No orders of the defined types were placed in this encounter.   Return if symptoms worsen or fail to improve.    Windell Norfolk, MD 02/25/2023, 9:53 AM  [facility] [address] [place], [place] [address] [phone]

## 2023-02-25 NOTE — Patient Instructions (Signed)
MRI brain without contrast MRI cervical spine without contrast Consider ibuprofen or Tylenol for the headaches Continue to follow with PCP Return as needed.

## 2023-02-26 ENCOUNTER — Encounter: Payer: Self-pay | Admitting: Cardiology

## 2023-02-26 ENCOUNTER — Ambulatory Visit: Payer: Medicare PPO | Admitting: Cardiology

## 2023-02-26 VITALS — BP 171/91 | HR 75 | Ht 68.0 in | Wt 176.0 lb

## 2023-02-26 DIAGNOSIS — I25118 Atherosclerotic heart disease of native coronary artery with other forms of angina pectoris: Secondary | ICD-10-CM

## 2023-02-26 DIAGNOSIS — I10 Essential (primary) hypertension: Secondary | ICD-10-CM

## 2023-02-26 NOTE — Progress Notes (Signed)
Patient referred by Gwenlyn Found, MD for exertional chest pain  Subjective:   Edgar Garcia, male    DOB: 07/25/1951, 72 y.o.   MRN: [id number]   Chief Complaint  Patient presents with   Claudication   Follow-up     HPI  72 y/o with hypertension, hyperlipidemia, prediabetes, prior h/o heavy alcohol use, now with CAD s/p unsuccessful percutaneous revascularization complicated by coronary dissection and peri-procedural MI (07/26/2021), positive stool occult blood test  He has has occasional retrosternal burning episodes. He is walking regular with his wife. He does not have any exertional chest pain/burning sensation symptoms.  Initial consultation HPI 06/2021: Patient is here with his wife today.  He is a Education administrator, works in Careers adviser.  He has had hypertension hyperlipidemia for 9 years, but he was.  Most with a TIA episode.  He since been on lisinopril 40 mg daily, amlodipine 10 mg daily, aspirin 81 mg daily, Crestor 10 mg daily.  He stays active, walking 2-3 miles several days a week.  Recently, he has noted episodes of personal burning sensation occurs at top of the head of the hill, improves with rest.  Episodes occur on a regular basis, and fairly impressive up with the symptoms or physical activity.  Blood pressure is elevated today, but usually much lower than this.  He notices that blood pressure is always elevated physician visits, and much lower at home.  He denies any recurrent TIA, stroke symptoms since his 1 episode of TIA 9 years ago.  He denies any shortness of breath, palpitation, orthopnea, PND symptoms.  On a separate note, he recently tested positive for colitis.  He has an upcoming appointment with gastroenterology next week.   Current Outpatient Medications:    amLODipine (NORVASC) 5 MG tablet, Take 5 mg by mouth in the morning., Disp: , Rfl:    aspirin 81 MG EC tablet, Take 81 mg by mouth in the morning., Disp: , Rfl:    atenolol (TENORMIN) 25 MG  tablet, Take 1 tablet (25 mg total) by mouth 2 (two) times daily. (Patient not taking: Reported on 02/19/2023), Disp: 90 tablet, Rfl: 3   atorvastatin (LIPITOR) 40 MG tablet, TAKE 1 TABLET(40 MG) BY MOUTH EVERY EVENING, Disp: 30 tablet, Rfl: 2   lisinopril (ZESTRIL) 20 MG tablet, Take 1 tablet (20 mg total) by mouth daily., Disp: 90 tablet, Rfl: 3   nitroGLYCERIN (NITROSTAT) 0.4 MG SL tablet, Place 1 tablet (0.4 mg total) under the tongue every 5 (five) minutes as needed for chest pain., Disp: 90 tablet, Rfl: 3   pantoprazole (PROTONIX) 40 MG tablet, TAKE 1 TABLET(40 MG) BY MOUTH TWICE DAILY, Disp: 180 tablet, Rfl: 1   ranolazine (RANEXA) 1000 MG SR tablet, TAKE 1 TABLET(1000 MG) BY MOUTH TWICE DAILY, Disp: 90 tablet, Rfl: 3   vitamin B-12 (CYANOCOBALAMIN) 1000 MCG tablet, Take 1,000 mcg by mouth in the morning., Disp: , Rfl:     Cardiovascular and other pertinent studies:  EKG 07/20/2022: Sinus rhythm 71 bpm Occasional PAC    Echocardiogram 10/13/2021:  Normal LV systolic function with visual EF 55-60%. Left ventricle cavity  is normal in size. Normal left ventricular wall thickness. Normal global  wall motion. Normal diastolic filling pattern, normal LAP.  Aortic valve sclerosis without stenosis. Trace aortic regurgitation.  Mild (Grade I) mitral regurgitation.  Mild tricuspid regurgitation. No evidence of pulmonary hypertension.  Compared to study 07/27/2021: LVEF remains preserved, no obvious RWMA on  current study, G1DD  is now normal, otherwise no significant change.   Echocardiogram 07/27/2021:  1. Left ventricular ejection fraction, by estimation, is 65 to 70%. The  left ventricle has normal function. The left ventricle demonstrates  regional wall motion abnormalities-mild hypokinesis in apical/anterior  apical myocardium. Left ventricular  diastolic parameters are consistent with Grade I diastolic dysfunction  (impaired relaxation). These changes are new compared previous outpatient   study in 06/2021.   2. There is mildly elevated pulmonary artery systolic pressure at 39  mmHg.   3. The inferior vena cava is normal in size with greater than 50%  respiratory variability, suggesting right atrial pressure of 3 mmHg.   Coronary intervention 07/26/2021: LM: Normal LAD: Prox 70-75% severe calcific disease         Mid LAD focal 95% stenosis Lcx: Dominant. No significant disease RCA: Non-ominant. Proximal 90% stenosis   Unsuccessful percutaneous coronary intervention mid LAD Dissection and partial vessel closure Ventricular fibrillation requiring emergent defibrillation Patient hemodynamically and electrically stable   Monitor in ICU overnight. Recommend Aspirin/Aggrastat. Provided mo clinical deterioration overnight requiring emergency CABG (less likely), will load with Brilint ain AM.    Will consider repeat angiography and intervention in 3-4 weeks after healing of dissection.  Exercise Tetrofosmin stress test 07/12/2021: Exercise nuclear stress test was performed using Bruce protocol. Patient reached 4.6 METS, and 90% of age predicted maximum heart rate. Exercise capacity was low. No chest pain reported, dyspnea and dizziness reported. Heart rate and hemodynamic response were normal. Stress EKG showed sinus tachycardia, nonspecific T wave inversion leads III, aVF, 1-1.5 mm upsloping ST depression in leads V5, V6 that normalize 1 min into recovery. SPECT images showed small sized, medium intensity, reversible perfusion defect in apical anterior, apical myocardium.  TID 1.23. Stress LVEF 80%.  High risk study due to regional perfusion deficit with TID >1.2.   Recent labs: 10/26/2022: Glucose 102, BUN/Cr 7/0.68. EGFR 98. Na/K 132/4.7  10/05/2022: Glucose 91, BUN/Cr 6/0.81. EGFR >60. Na/K 130/3.9.  H/H 14/40. MCV 97. Platelets 269 Trop HS 4, 4  07/27/2022: Chol 128, TG 58, HDL 67, LDL 48   Review of Systems  Cardiovascular:  Negative for chest pain, dyspnea on  exertion, leg swelling, palpitations and syncope.         Vitals:   02/26/23 1030 02/26/23 1034  BP: (!) 155/80 (!) 171/91  Pulse: 67 75  SpO2: 98%     Body mass index is 26.76 kg/m. Filed Weights   02/26/23 1030  Weight: 176 lb (79.8 kg)       Objective:   Physical Exam Vitals and nursing note reviewed.  Constitutional:      General: He is not in acute distress. Neck:     Vascular: No JVD.  Cardiovascular:     Rate and Rhythm: Normal rate and regular rhythm.     Heart sounds: Normal heart sounds. No murmur heard. Pulmonary:     Effort: Pulmonary effort is normal.     Breath sounds: Normal breath sounds. No wheezing or rales.  Musculoskeletal:     Right lower leg: No edema.     Left lower leg: No edema.          Assessment & Recommendations:    72 y/o with hypertension, hyperlipidemia, prediabetes, prior h/o heavy alcohol use, now with CAD s/p unsuccessful percutaneous revascularization complicated by coronary dissection and peri-procedural MI (07/26/2021), positive stool occult blood test  CAD: He has random short lasting episodes of chest, shoulder pain for a few  seconds. However, he has not had any significant limiting exertional chest pain symptoms.  Continue aspirin 81 mg daily, Ranexa 1000 mg twice daily, Lipitor 40 mg daily. He has not tolerated beta blockers, including metoprolol and atenolol, due to dizziness symptoms.  Okay to proceed with pending colonoscopy with gastroenterology.   Mixed hyperlipidemia: Continue Lipitor 40 mg. Chol 128, TG 58, HDL 67, LDL 48 (07/2022)  Hypertension:  BP elevated today, but very well controlled at home-mostly 120-140 mmHg SBP with only occasional 140s.  F/u in 6 months    Elder Negus, MD Pager: [phone] Office: [phone]

## 2023-03-01 ENCOUNTER — Telehealth: Payer: Self-pay | Admitting: Neurology

## 2023-03-01 NOTE — Telephone Encounter (Signed)
Francine Graven Berkley Harvey: [id number] exp. 03/01/23-03/31/23 sent to GI [phone]

## 2023-03-06 ENCOUNTER — Ambulatory Visit (AMBULATORY_SURGERY_CENTER): Payer: Medicare PPO | Admitting: Gastroenterology

## 2023-03-06 ENCOUNTER — Encounter: Payer: Self-pay | Admitting: Gastroenterology

## 2023-03-06 VITALS — BP 118/69 | HR 62 | Temp 97.5°F | Resp 11 | Ht 71.0 in | Wt 170.0 lb

## 2023-03-06 DIAGNOSIS — D122 Benign neoplasm of ascending colon: Secondary | ICD-10-CM

## 2023-03-06 DIAGNOSIS — Z09 Encounter for follow-up examination after completed treatment for conditions other than malignant neoplasm: Secondary | ICD-10-CM | POA: Diagnosis not present

## 2023-03-06 DIAGNOSIS — D120 Benign neoplasm of cecum: Secondary | ICD-10-CM | POA: Diagnosis not present

## 2023-03-06 DIAGNOSIS — Z86010 Personal history of colonic polyps: Secondary | ICD-10-CM

## 2023-03-06 MED ORDER — SODIUM CHLORIDE 0.9 % IV SOLN: 500.0000 mL | INTRAVENOUS | Status: DC

## 2023-03-06 NOTE — Progress Notes (Signed)
Patient reports no health or medications changes since pre visit. 

## 2023-03-06 NOTE — Op Note (Signed)
[facility] Patient Name: Edgar Garcia Procedure Date: 03/06/2023 11:20 AM MRN: [id number] Endoscopist: Lorin Picket E. Tomasa Rand , MD, [id number] Age: 72 Referring MD:  Date of Birth: [birth year] Gender: Male Account #: [id number] Procedure:                Colonoscopy Indications:              Surveillance: History of adenomatous polyps,                            inadequate prep on last exam (<64yr) Medicines:                Monitored Anesthesia Care Procedure:                Pre-Anesthesia Assessment:                           - Prior to the procedure, a History and Physical                            was performed, and patient medications and                            allergies were reviewed. The patient's tolerance of                            previous anesthesia was also reviewed. The risks                            and benefits of the procedure and the sedation                            options and risks were discussed with the patient.                            All questions were answered, and informed consent                            was obtained. Prior Anticoagulants: The patient has                            taken no anticoagulant or antiplatelet agents                            except for aspirin. ASA Grade Assessment: III - A                            patient with severe systemic disease. After                            reviewing the risks and benefits, the patient was                            deemed in satisfactory condition to undergo the  procedure.                           After obtaining informed consent, the colonoscope                            was passed under direct vision. Throughout the                            procedure, the patient's blood pressure, pulse, and                            oxygen saturations were monitored continuously. The                            Olympus CF-HQ190L ([id number]) Colonoscope was                             introduced through the anus and advanced to the the                            cecum, identified by appendiceal orifice and                            ileocecal valve. The colonoscopy was unusually                            difficult due to significant looping and a tortuous                            colon. Successful completion of the procedure was                            aided by using manual pressure, withdrawing and                            reinserting the scope and straightening and                            shortening the scope to obtain bowel loop                            reduction. The patient tolerated the procedure                            well. The quality of the bowel preparation was                            adequate. The ileocecal valve, appendiceal orifice,                            and rectum were photographed. The bowel preparation  used was Miralax and SUPREP via extended prep with                            split dose instruction. Scope In: 11:32:39 AM Scope Out: 12:06:32 PM Scope Withdrawal Time: 0 hours 22 minutes 26 seconds  Total Procedure Duration: 0 hours 33 minutes 53 seconds  Findings:                 The perianal and digital rectal examinations were                            normal. Pertinent negatives include normal                            sphincter tone and no palpable rectal lesions.                           A 10 mm polyp was found in the cecum. The polyp was                            sessile. The polyp was removed with a cold snare.                            Resection and retrieval were complete. Estimated                            blood loss was minimal.                           A 4 mm polyp was found in the ascending colon. The                            polyp was semi-sessile. The polyp was removed with                            a cold snare. Resection and retrieval were                             complete. Estimated blood loss was minimal.                           A tattoo was seen in the rectum. A post-polypectomy                            scar was found at the tattoo site. There was no                            evidence of residual polyp tissue.                           The exam was otherwise normal throughout the                            examined  colon.                           The retroflexed view of the distal rectum and anal                            verge was normal and showed no anal or rectal                            abnormalities. Complications:            No immediate complications. Estimated Blood Loss:     Estimated blood loss was minimal. Impression:               - One 10 mm polyp in the cecum, removed with a cold                            snare. Resected and retrieved.                           - One 4 mm polyp in the ascending colon, removed                            with a cold snare. Resected and retrieved.                           - A tattoo was seen in the rectum. A                            post-polypectomy scar was found at the tattoo site.                            There was no evidence of residual polyp tissue.                           - The distal rectum and anal verge are normal on                            retroflexion view. Recommendation:           - Patient has a contact number available for                            emergencies. The signs and symptoms of potential                            delayed complications were discussed with the                            patient. Return to normal activities tomorrow.                            Written discharge instructions were provided to the  patient.                           - Resume previous diet.                           - Continue present medications.                           - Await pathology results.                           - Repeat  colonoscopy (date not yet determined) for                            surveillance based on pathology results. Dorianne Perret E. Tomasa Rand, MD 03/06/2023 12:13:16 PM This report has been signed electronically.

## 2023-03-06 NOTE — Progress Notes (Signed)
Sedate, gd SR, tolerated procedure well, VSS, report to RN 

## 2023-03-06 NOTE — Progress Notes (Signed)
Called to room to assist during endoscopic procedure.  Patient ID and intended procedure confirmed with present staff. Received instructions for my participation in the procedure from the performing physician.  

## 2023-03-06 NOTE — Patient Instructions (Signed)
Handout on polyps given.    YOU HAD AN ENDOSCOPIC PROCEDURE TODAY AT THE [facility]:   Refer to the procedure report that was given to you for any specific questions about what was found during the examination.  If the procedure report does not answer your questions, please call your gastroenterologist to clarify.  If you requested that your care partner not be given the details of your procedure findings, then the procedure report has been included in a sealed envelope for you to review at your convenience later.  YOU SHOULD EXPECT: Some feelings of bloating in the abdomen. Passage of more gas than usual.  Walking can help get rid of the air that was put into your GI tract during the procedure and reduce the bloating. If you had a lower endoscopy (such as a colonoscopy or flexible sigmoidoscopy) you may notice spotting of blood in your stool or on the toilet paper. If you underwent a bowel prep for your procedure, you may not have a normal bowel movement for a few days.  Please Note:  You might notice some irritation and congestion in your nose or some drainage.  This is from the oxygen used during your procedure.  There is no need for concern and it should clear up in a day or so.  SYMPTOMS TO REPORT IMMEDIATELY:  Following lower endoscopy (colonoscopy or flexible sigmoidoscopy):  Excessive amounts of blood in the stool  Significant tenderness or worsening of abdominal pains  Swelling of the abdomen that is new, acute  Fever of 100F or higher   For urgent or emergent issues, a gastroenterologist can be reached at any hour by calling [phone]. Do not use MyChart messaging for urgent concerns.    DIET:  We do recommend a small meal at first, but then you may proceed to your regular diet.  Drink plenty of fluids but you should avoid alcoholic beverages for 24 hours.  ACTIVITY:  You should plan to take it easy for the rest of today and you should NOT DRIVE or use heavy  machinery until tomorrow (because of the sedation medicines used during the test).    FOLLOW UP: Our staff will call the number listed on your records the next business day following your procedure.  We will call around 7:15- 8:00 am to check on you and address any questions or concerns that you may have regarding the information given to you following your procedure. If we do not reach you, we will leave a message.     If any biopsies were taken you will be contacted by phone or by letter within the next 1-3 weeks.  Please call us at [phone] if you have not heard about the biopsies in 3 weeks.    SIGNATURES/CONFIDENTIALITY: You and/or your care partner have signed paperwork which will be entered into your electronic medical record.  These signatures attest to the fact that that the information above on your After Visit Summary has been reviewed and is understood.  Full responsibility of the confidentiality of this discharge information lies with you and/or your care-partner.  

## 2023-03-06 NOTE — Progress Notes (Signed)
[facility] History and Physical   Primary Care Physician:  Gwenlyn Found, MD   Reason for Procedure:   Colon polyp surveillance  Plan:    Colonoscopy     HPI: Edgar Garcia is a 72 y.o. male undergoing surveillance colonoscopy.  He had a positive Cologuard test in 2022 and underwent a colonoscopy in March 2023 which was limited by a poor bowel prep.   He has no family history of colon cancer and no chronic GI symptoms.  Eight polyps were removed during that exam, including a 14 mm rectal polyp (tubular adenoma)  Past Medical History:  Diagnosis Date   Anxiety    CAD (coronary artery disease)    GERD (gastroesophageal reflux disease)    Hyperlipidemia    Hypertension    Myocardial infarction (HCC)    TIA (transient ischemic attack)     Past Surgical History:  Procedure Laterality Date   CORONARY BALLOON ANGIOPLASTY N/A 07/26/2021   Procedure: CORONARY BALLOON ANGIOPLASTY;  Surgeon: Elder Negus, MD;  Location: [facility];  Service: Cardiovascular;  Laterality: N/A;   LEFT HEART CATH AND CORONARY ANGIOGRAPHY N/A 07/26/2021   Procedure: LEFT HEART CATH AND CORONARY ANGIOGRAPHY;  Surgeon: Elder Negus, MD;  Location: [facility];  Service: Cardiovascular;  Laterality: N/A;    Prior to Admission medications   Medication Sig Start Date End Date Taking? Authorizing Provider  amLODipine (NORVASC) 5 MG tablet Take 5 mg by mouth in the morning. 06/14/21  Yes [provider]  aspirin 81 MG EC tablet Take 81 mg by mouth in the morning. 10/19/15  Yes [provider]  atorvastatin (LIPITOR) 40 MG tablet TAKE 1 TABLET(40 MG) BY MOUTH EVERY EVENING 12/11/22  Yes Patwardhan, Manish J, MD  lisinopril (ZESTRIL) 20 MG tablet Take 1 tablet (20 mg total) by mouth daily. 07/20/22  Yes Patwardhan, Manish J, MD  pantoprazole (PROTONIX) 40 MG tablet TAKE 1 TABLET(40 MG) BY MOUTH TWICE DAILY 10/02/22  Yes Tressia Danas, MD  ranolazine  (RANEXA) 1000 MG SR tablet TAKE 1 TABLET(1000 MG) BY MOUTH TWICE DAILY 01/22/23  Yes Patwardhan, Manish J, MD  vitamin B-12 (CYANOCOBALAMIN) 1000 MCG tablet Take 1,000 mcg by mouth in the morning.   Yes [provider]  nitroGLYCERIN (NITROSTAT) 0.4 MG SL tablet Place 1 tablet (0.4 mg total) under the tongue every 5 (five) minutes as needed for chest pain. 06/29/21 02/26/23  Elder Negus, MD    Current Outpatient Medications  Medication Sig Dispense Refill   amLODipine (NORVASC) 5 MG tablet Take 5 mg by mouth in the morning.     aspirin 81 MG EC tablet Take 81 mg by mouth in the morning.     atorvastatin (LIPITOR) 40 MG tablet TAKE 1 TABLET(40 MG) BY MOUTH EVERY EVENING 30 tablet 2   lisinopril (ZESTRIL) 20 MG tablet Take 1 tablet (20 mg total) by mouth daily. 90 tablet 3   pantoprazole (PROTONIX) 40 MG tablet TAKE 1 TABLET(40 MG) BY MOUTH TWICE DAILY 180 tablet 1   ranolazine (RANEXA) 1000 MG SR tablet TAKE 1 TABLET(1000 MG) BY MOUTH TWICE DAILY 90 tablet 3   vitamin B-12 (CYANOCOBALAMIN) 1000 MCG tablet Take 1,000 mcg by mouth in the morning.     nitroGLYCERIN (NITROSTAT) 0.4 MG SL tablet Place 1 tablet (0.4 mg total) under the tongue every 5 (five) minutes as needed for chest pain. 90 tablet 3   Current Facility-Administered Medications  Medication Dose Route Frequency Provider  Last Rate Last Admin   0.9 %  sodium chloride infusion  500 mL Intravenous Continuous Jenel Lucks, MD        Allergies as of 03/06/2023 - Review Complete 02/26/2023  Allergen Reaction Noted   Brilinta [ticagrelor] Other (See Comments) 08/25/2021   Isordil [isosorbide nitrate] Other (See Comments) 08/25/2021    Family History  Problem Relation Age of Onset   Cancer Mother    Hypertension Sister    Hypertension Sister    Hypertension Sister    Colon polyps Brother    Hypertension Brother    Colon cancer Neg Hx    Esophageal cancer Neg Hx    Rectal cancer Neg Hx    Stomach cancer Neg  Hx     Social History   Socioeconomic History   Marital status: Married    Spouse name: Not on file   Number of children: 3   Years of education: Not on file   Highest education level: Not on file  Occupational History   Not on file  Tobacco Use   Smoking status: Former    Packs/day: 0.50    Years: 25.00    Additional pack years: 0.00    Total pack years: 12.50    Types: Cigarettes    Quit date: 2013    Years since quitting: 11.4   Smokeless tobacco: Never  Vaping Use   Vaping Use: Never used  Substance and Sexual Activity   Alcohol use: Not Currently    Comment: stopped November 2022   Drug use: No   Sexual activity: Not on file  Other Topics Concern   Not on file  Social History Narrative   Not on file   Social Determinants of Health   Financial Resource Strain: Not on file  Food Insecurity: Not on file  Transportation Needs: Not on file  Physical Activity: Not on file  Stress: Not on file  Social Connections: Not on file  Intimate Partner Violence: Not on file    Review of Systems:  All other review of systems negative except as mentioned in the HPI.  Physical Exam: Vital signs BP (!) 149/89   Pulse 75   Temp (!) 97.5 F (36.4 C)   Ht 5\' 11"  (1.803 m)   Wt 170 lb (77.1 kg)   SpO2 99%   BMI 23.71 kg/m   General:   Alert,  Well-developed, well-nourished, pleasant and cooperative in NAD Airway:  Mallampati 1 Lungs:  Clear throughout to auscultation.   Heart:  Regular rate and rhythm; no murmurs, clicks, rubs,  or gallops. Abdomen:  Soft, nontender and nondistended. Normal bowel sounds.   Neuro/Psych:  Normal mood and affect. A and O x 3   Chavis Tessler E. Tomasa Rand, MD [facility]

## 2023-03-07 ENCOUNTER — Telehealth: Payer: Self-pay | Admitting: *Deleted

## 2023-03-07 NOTE — Telephone Encounter (Signed)
Post procedure follow up phone call. No answer at number given.  Left message on voicemail.  

## 2023-03-12 ENCOUNTER — Ambulatory Visit
Admission: RE | Admit: 2023-03-12 | Discharge: 2023-03-12 | Disposition: A | Discharge status: Home / Self Care | Payer: Medicare PPO | Source: Ambulatory Visit | Attending: Neurology | Admitting: Neurology

## 2023-03-12 DIAGNOSIS — G44229 Chronic tension-type headache, not intractable: Secondary | ICD-10-CM

## 2023-03-13 ENCOUNTER — Telehealth: Payer: Self-pay

## 2023-03-13 NOTE — Telephone Encounter (Signed)
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

## 2023-03-13 NOTE — Telephone Encounter (Signed)
Pt called back. Requesting a call back from nurse.  

## 2023-03-13 NOTE — Telephone Encounter (Signed)
Results given to patient and he verbalized understanding and in agreement to follow up with PCP. He  states he did not have The brain MRI yesterday and is going to hold off on having that done while dealing with his neck pain. Encouraged patient to call back when ready for brain MRI.

## 2023-03-13 NOTE — Telephone Encounter (Signed)
Call to patient, no answer.  Left message.

## 2023-03-14 ENCOUNTER — Other Ambulatory Visit: Payer: Self-pay | Admitting: Cardiology

## 2023-03-15 NOTE — Progress Notes (Signed)
Mr. Egger,  The two polyps that I removed during your recent procedure were completely benign but were proven to be "pre-cancerous" polyps that MAY have grown into cancers if they had not been removed.  Studies shows that at least 20% of women over age 74 and 30% of men over age 15 have pre-cancerous polyps.  Based on current nationally recognized surveillance guidelines, I recommend that you have a repeat colonoscopy in 3 years.   However, because colon cancer screening after age 33 is done on a case-by-case basis, taking into account the patient's risk factors for colon cancer, as well as comorbidities and life expectancy, I would recommend you make an appointment with me in 3 years to discuss the risks/benefits of further colon cancer screening.

## 2023-03-22 IMAGING — DX DG CHEST 1V PORT
2 series · 2 of 2 positions shown · non-contrast
Comparison: 03/27/2012

CLINICAL DATA: Chest pain

EXAM:
PORTABLE CHEST 1 VIEW

[chest ap (1 of 2)]
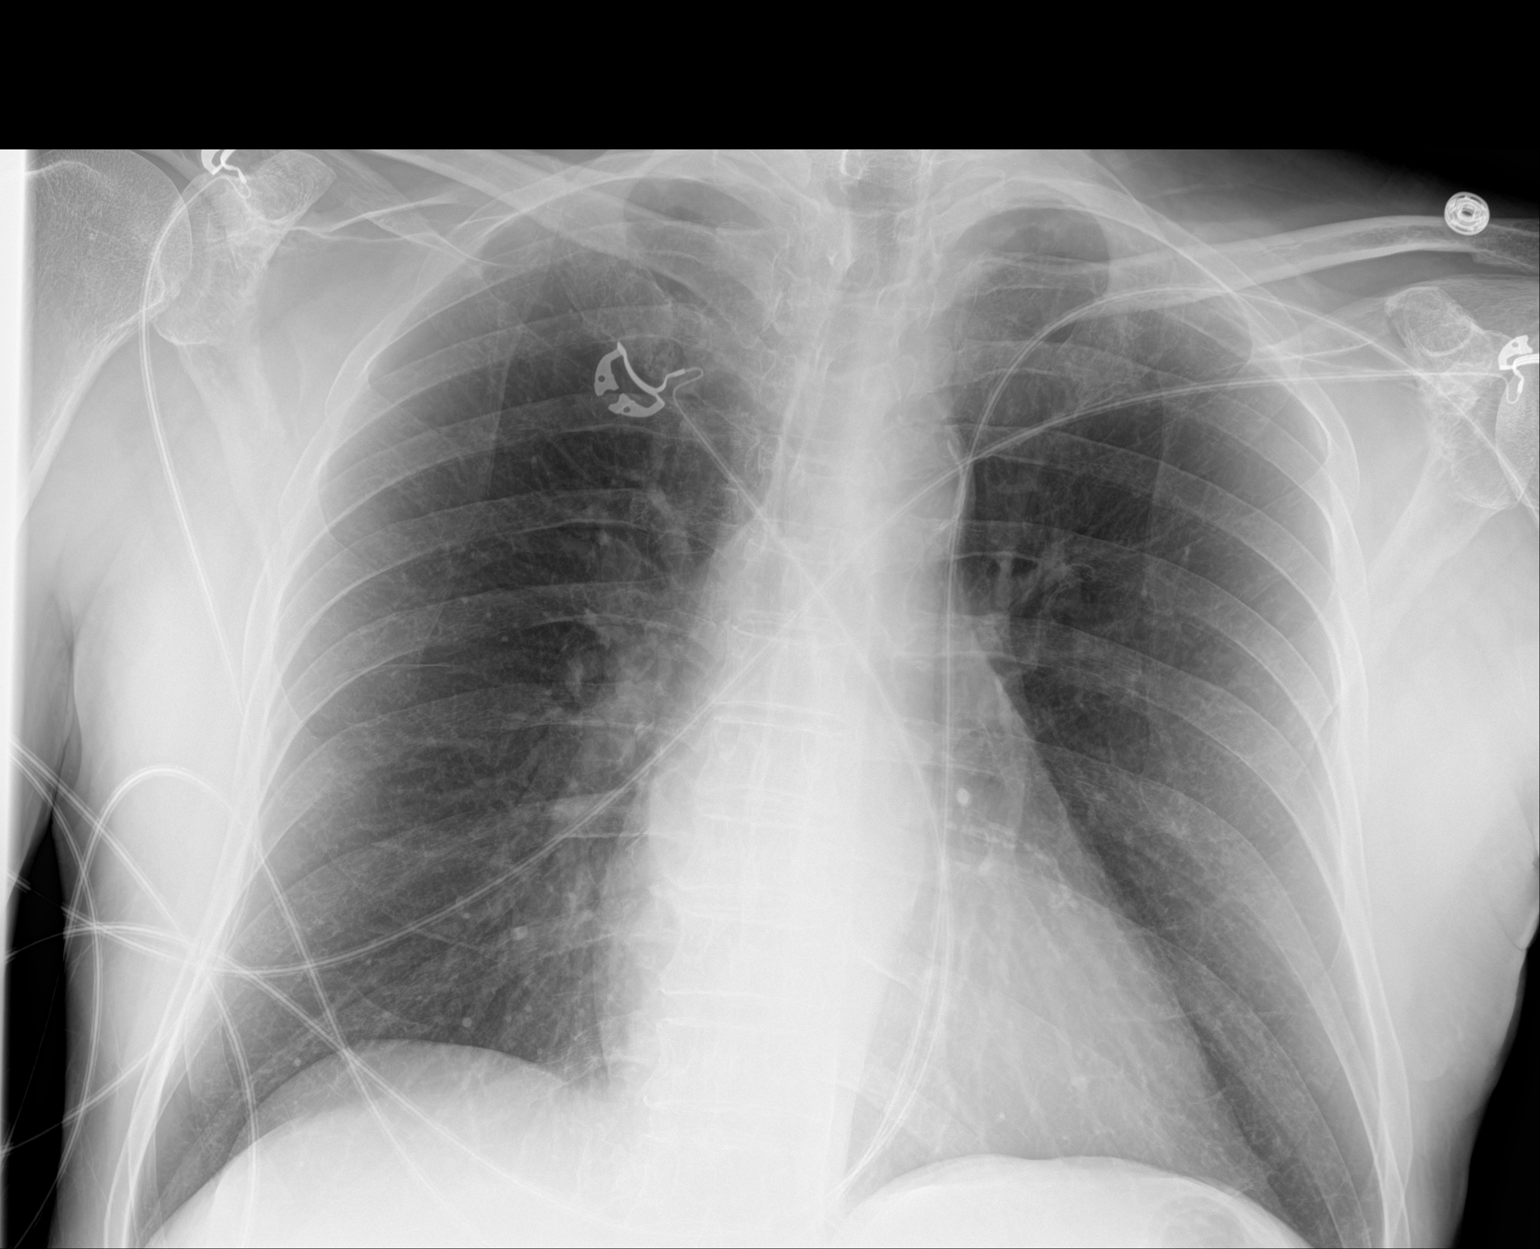

[chest ap (2 of 2)]
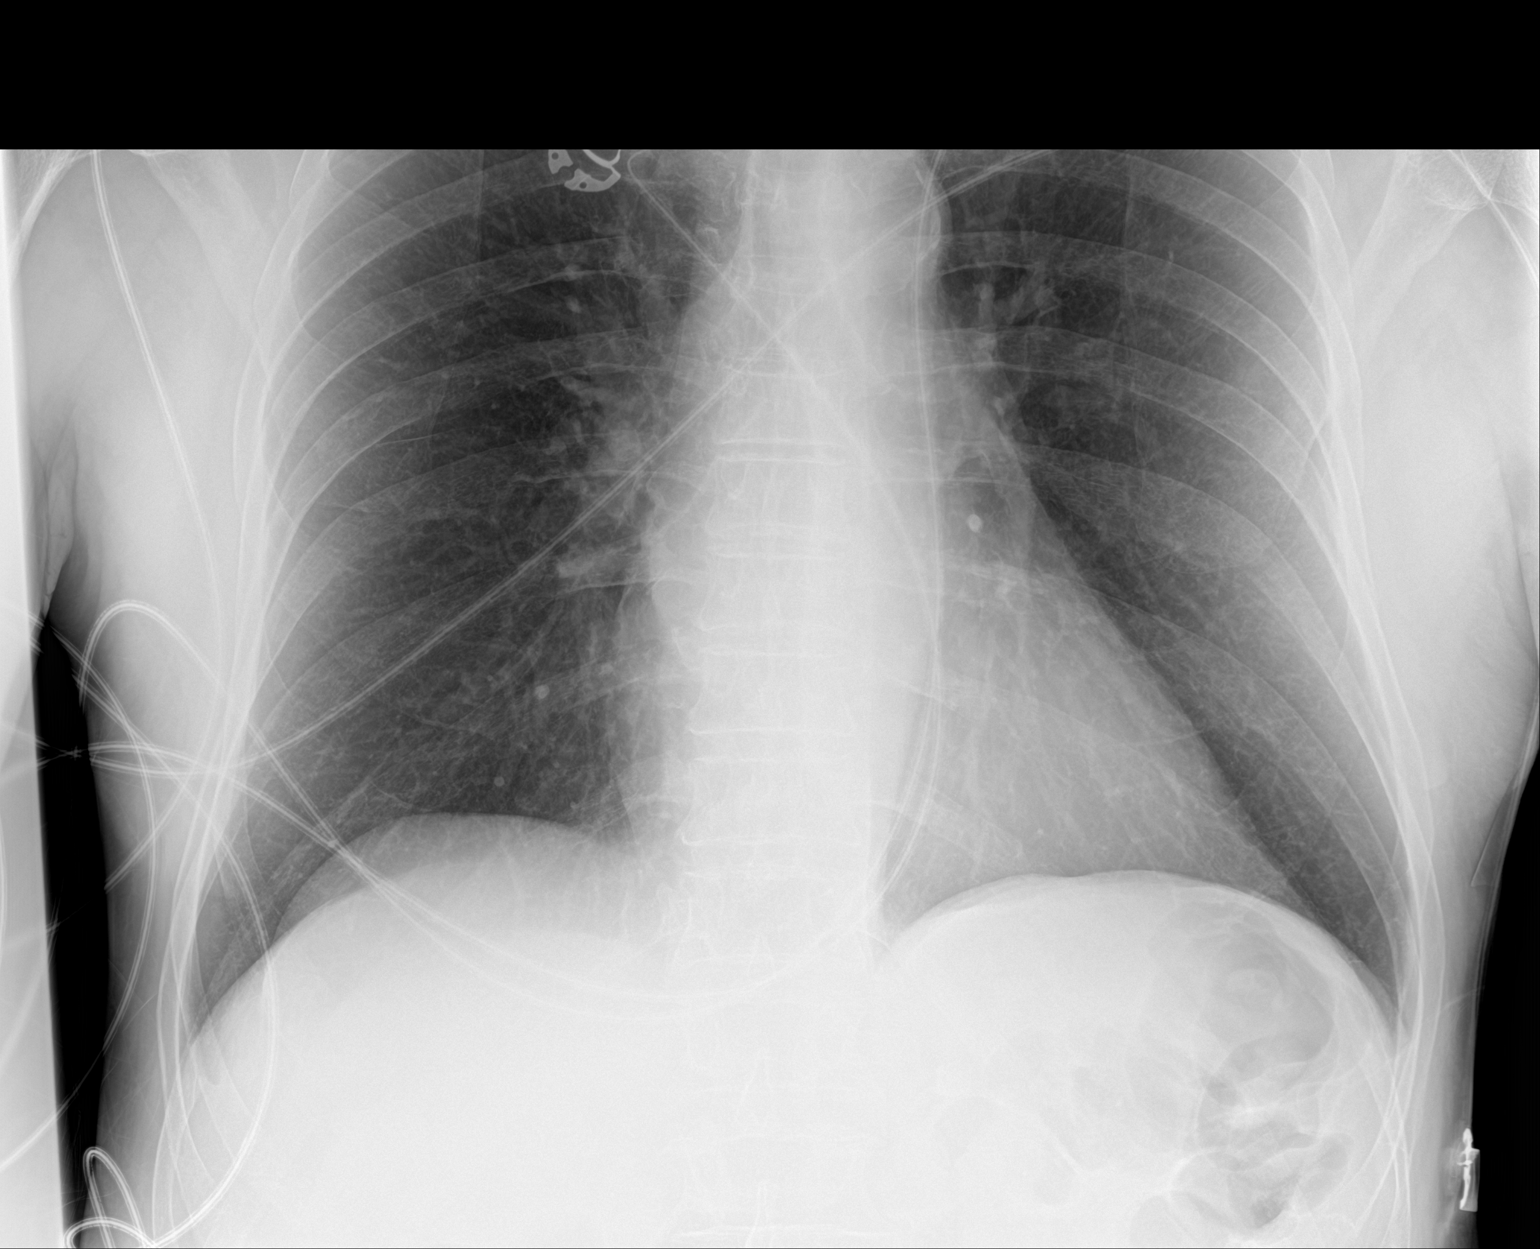

[2 of 2 positions shown; findings below may reference images not displayed]

FINDINGS: Cardiac size is within normal limits. There are no signs of
pulmonary edema or focal pulmonary consolidation. There is no
significant pleural effusion or pneumothorax.
IMPRESSION: No active disease.

## 2023-04-09 ENCOUNTER — Other Ambulatory Visit: Payer: Self-pay | Admitting: Gastroenterology

## 2023-04-09 DIAGNOSIS — D500 Iron deficiency anemia secondary to blood loss (chronic): Secondary | ICD-10-CM

## 2023-04-09 DIAGNOSIS — K259 Gastric ulcer, unspecified as acute or chronic, without hemorrhage or perforation: Secondary | ICD-10-CM

## 2023-06-13 ENCOUNTER — Other Ambulatory Visit: Payer: Self-pay | Admitting: Cardiology

## 2023-07-20 ENCOUNTER — Other Ambulatory Visit: Payer: Self-pay | Admitting: Cardiology

## 2023-07-20 DIAGNOSIS — I2510 Atherosclerotic heart disease of native coronary artery without angina pectoris: Secondary | ICD-10-CM

## 2023-08-05 ENCOUNTER — Other Ambulatory Visit: Payer: Self-pay | Admitting: Cardiology

## 2023-08-05 DIAGNOSIS — I10 Essential (primary) hypertension: Secondary | ICD-10-CM

## 2023-08-24 ENCOUNTER — Telehealth: Payer: Self-pay | Admitting: Cardiology

## 2023-08-24 NOTE — Telephone Encounter (Signed)
Noted ? ?Thanks ?MJP ? ?

## 2023-08-24 NOTE — Telephone Encounter (Signed)
Patient called and canceled ABI W/WO TBI test stating he did not want this test.

## 2023-08-29 ENCOUNTER — Ambulatory Visit: Payer: Self-pay | Admitting: Cardiology

## 2023-08-30 ENCOUNTER — Ambulatory Visit (HOSPITAL_COMMUNITY): Payer: Medicare PPO

## 2023-10-08 ENCOUNTER — Other Ambulatory Visit: Payer: Self-pay | Admitting: Gastroenterology

## 2023-10-08 DIAGNOSIS — K259 Gastric ulcer, unspecified as acute or chronic, without hemorrhage or perforation: Secondary | ICD-10-CM

## 2023-10-08 DIAGNOSIS — D500 Iron deficiency anemia secondary to blood loss (chronic): Secondary | ICD-10-CM

## 2023-10-12 ENCOUNTER — Ambulatory Visit: Payer: Medicare PPO | Attending: Cardiology | Admitting: Cardiology

## 2023-10-12 ENCOUNTER — Encounter: Payer: Self-pay | Admitting: Cardiology

## 2023-10-12 VITALS — BP 130/82 | HR 78 | Resp 16 | Ht 71.0 in | Wt 178.0 lb

## 2023-10-12 DIAGNOSIS — E782 Mixed hyperlipidemia: Secondary | ICD-10-CM

## 2023-10-12 DIAGNOSIS — I25118 Atherosclerotic heart disease of native coronary artery with other forms of angina pectoris: Secondary | ICD-10-CM | POA: Diagnosis not present

## 2023-10-12 LAB — BASIC METABOLIC PANEL
BUN/Creatinine Ratio: 13 (ref 10–24)
BUN: 9 mg/dL (ref 8–27)
CO2: 23 mmol/L (ref 20–29)
Calcium: 9 mg/dL (ref 8.6–10.2)
Chloride: 94 mmol/L — ABNORMAL LOW (ref 96–106)
Creatinine, Ser: 0.7 mg/dL — ABNORMAL LOW (ref 0.76–1.27)
Glucose: 102 mg/dL — ABNORMAL HIGH (ref 70–99)
Potassium: 3.9 mmol/L (ref 3.5–5.2)
Sodium: 132 mmol/L — ABNORMAL LOW (ref 134–144)
eGFR: 97 mL/min/{1.73_m2} (ref >59)

## 2023-10-12 LAB — LIPID PANEL
Chol/HDL Ratio: 1.9 {ratio} (ref 0.0–5.0)
Cholesterol, Total: 144 mg/dL (ref 100–199)
HDL: 76 mg/dL (ref >39)
LDL Chol Calc (NIH): 57 mg/dL (ref 0–99)
Triglycerides: 52 mg/dL (ref 0–149)
VLDL Cholesterol Cal: 11 mg/dL (ref 5–40)

## 2023-10-12 NOTE — Progress Notes (Signed)
Cardiology Office Note:  .   Date:  10/12/2023  ID:  Edgar Garcia, DOB 1951-08-17, MRN [id number] PCP: Edgar Found, MD  [facility] Providers Cardiologist:  Edgar Mainland, MD PCP: Edgar Found, MD  Chief Complaint  Patient presents with   Coronary Artery Disease   Follow-up    6 month      History of Present Illness: .    Edgar Garcia is a 73 y.o. male with hypertension, hyperlipidemia, prediabetes, prior h/o heavy alcohol use, now with CAD s/p unsuccessful percutaneous revascularization complicated by coronary dissection and peri-procedural MI (07/26/2021), colon polyps without active bleeding  Patient continues to have episodes of retrosternal burning with radiation to her neck.  He also has pain in his upper back and shoulders.  Symptoms have been present for last 2 years, with some recent increase.  There is no consistency with the symptoms with the same level of activity, but symptoms do seem to get worse with walking uphill.  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  In mid 2024, he underwent resection of polyps that were precancerous.  There is no active bleeding at this time.  He was recommended repeat colonoscopy in 3 years.  Vitals:   10/12/23 0814  BP: 130/82  Pulse: 78  Resp: 16  SpO2: 98%     ROS:  Review of Systems  Cardiovascular:  Positive for chest pain. Negative for dyspnea on exertion, leg swelling, palpitations and syncope.     Studies Reviewed: Marland Kitchen        No recent labs available    Physical Exam:   Physical Exam Vitals and nursing note reviewed.  Constitutional:      General: He is not in acute distress. Neck:     Vascular: No JVD.  Cardiovascular:     Rate and Rhythm: Normal rate and regular rhythm.     Heart sounds: Normal heart sounds. No murmur heard. Pulmonary:     Effort: Pulmonary effort is normal.     Breath sounds: Normal  breath sounds. No wheezing or rales.  Musculoskeletal:     Right lower leg: No edema.     Left lower leg: No edema.      VISIT DIAGNOSES:   ICD-10-CM   1. Coronary artery disease of native artery of native heart with stable angina pectoris (HCC)  I25.118 atenolol (TENORMIN) 25 MG tablet    CBC    Basic metabolic panel    Lipid Profile    NM PET CT CARDIAC PERFUSION MULTI W/ABSOLUTE BLOODFLOW    Cardiac Stress Test: Informed Consent Details: Physician/Practitioner Attestation; Transcribe to consent form and obtain patient signature    Lipid Profile    Basic metabolic panel    CBC    2. Mixed hyperlipidemia  E78.2 Lipid Profile    Lipid Profile       ASSESSMENT AND PLAN: .    Edgar Garcia is a 73 y.o. male with hypertension, hyperlipidemia, prediabetes, prior h/o heavy alcohol use, now with CAD s/p unsuccessful percutaneous revascularization complicated by coronary dissection and peri-procedural MI (07/26/2021), colon polyps without active bleeding   CAD: Known severe LAD disease with unsuccessful attempt at PCI in 2022. He is continue to have retrosternal burning, as well as chest/neck/shoulder pain.  It is somewhat difficult to tease out his anginal symptoms from his cervical stenosis symptoms.  However, there is recent increase in his retrosternal burning  on walking uphill on certain days. I will obtain cardiac PET/CT stress test for definite evaluation of ischemia. If significant ischemia noted, he would need repeat coronary angiography.  If disease is limited to LAD alone, we could consider robotic LIMA-LAD in the future versus repeat attempt at percutaneous intervention. Continue aspirin 81 mg daily, Ranexa 1000 mg twice daily, Lipitor 40 mg daily. He has not tolerated metoprolol due to dizziness, and Imdur due to headache.  However, he is willing to try atenolol 25 mg daily again.     Mixed hyperlipidemia: Continue Lipitor 40 mg. Chol 128, TG 58, HDL 67, LDL 48  (07/2022) Will check lipid panel today.  Hypertension:  Well-controlled.  Informed Consent   Shared Decision Making/Informed Consent The risks [chest pain, shortness of breath, cardiac arrhythmias, dizziness, blood pressure fluctuations, myocardial infarction, stroke/transient ischemic attack, nausea, vomiting, allergic reaction, radiation exposure, metallic taste sensation and life-threatening complications (estimated to be 1 in 10,000)], benefits (risk stratification, diagnosing coronary artery disease, treatment guidance) and alternatives of a cardiac PET stress test were discussed in detail with Edgar Garcia and he agrees to proceed.       No orders of the defined types were placed in this encounter.    F/u in 3 months  Signed, Edgar Negus, MD

## 2023-10-12 NOTE — Patient Instructions (Addendum)
Medication Instructions:   PLEASE RESUME BACK TAKING ATENOLOL 25 MG BY MOUTH DAILY  *If you need a refill on your cardiac medications before your next appointment, please call your pharmacy*   Lab Work:  TODAY DOWNSTAIRS FIRST FLOOR AT [facility]--CBC, BMET, AND LIPIDS  If you have labs (blood work) drawn today and your tests are completely normal, you will receive your results only by: MyChart Message (if you have MyChart) OR A paper copy in the mail If you have any lab test that is abnormal or we need to change your treatment, we will call you to review the results.   Testing/Procedures:     Please report to Radiology at the [facility] Main Entrance 30 minutes early for your test.  [address] [place], [place] [address]                    How to Prepare for Your Cardiac PET/CT Stress Test:  Nothing to eat or drink, except water, 3 hours prior to arrival time.  NO caffeine/decaffeinated products, or chocolate 12 hours prior to arrival. (Please note decaffeinated beverages (teas/coffees) still contain caffeine).  If you have caffeine within 12 hours prior, the test will need to be rescheduled.  Medication instructions: Do not take erectile dysfunction medications for 72 hours prior to test (sildenafil, tadalafil) Do not take nitrates (isosorbide mononitrate, Ranexa) the day before or day of test PLEASE HOLD ATENOLOL, NITROGLYCERIN, AND RANEXA THE DAY OF THIS TEST Do not take tamsulosin the day before or morning of test    You may take your remaining medications with water.  NO perfume, cologne or lotion on chest or abdomen area.   Total time is 1 to 2 hours; you may want to bring reading material for the waiting time.   In preparation for your appointment, medication and supplies will be purchased.  Appointment availability is limited, so if you need to cancel or reschedule, please call the Radiology Department Scheduler at [phone] 24 hours in advance to  avoid a cancellation fee of $100.00  What to Expect When you Arrive:  Once you arrive and check in for your appointment, you will be taken to a preparation room within the Radiology Department.  A technologist or Nurse will obtain your medical history, verify that you are correctly prepped for the exam, and explain the procedure.  Afterwards, an IV will be started in your arm and electrodes will be placed on your skin for EKG monitoring during the stress portion of the exam. Then you will be escorted to the PET/CT scanner.  There, staff will get you positioned on the scanner and obtain a blood pressure and EKG.  During the exam, you will continue to be connected to the EKG and blood pressure machines.  A small, safe amount of a radioactive tracer will be injected in your IV to obtain a series of pictures of your heart along with an injection of a stress agent.    After your Exam:  It is recommended that you eat a meal and drink a caffeinated beverage to counter act any effects of the stress agent.  Drink plenty of fluids for the remainder of the day and urinate frequently for the first couple of hours after the exam.  Your doctor will inform you of your test results within 7-10 business days.  For more information and frequently asked questions, please visit our website: [email or web address]  For questions about your test or how to prepare  for your test, please call: Cardiac Imaging Nurse Navigators Office: [phone]     Follow-Up:  3 MONTHS WITH DR. PATWARDHAN OR AN EXTENDER

## 2023-10-13 LAB — CBC
Hematocrit: 44 % (ref 37.5–51.0)
Hemoglobin: 15.3 g/dL (ref 13.0–17.7)
MCH: 35.4 pg — ABNORMAL HIGH (ref 26.6–33.0)
MCHC: 34.8 g/dL (ref 31.5–35.7)
MCV: 102 fL — ABNORMAL HIGH (ref 79–97)
Platelets: 306 10*3/uL (ref 150–450)
RBC: 4.32 x10E6/uL (ref 4.14–5.80)
RDW: 11.1 % — ABNORMAL LOW (ref 11.6–15.4)
WBC: 4.8 10*3/uL (ref 3.4–10.8)

## 2023-10-15 ENCOUNTER — Encounter: Payer: Self-pay | Admitting: Cardiology

## 2023-11-02 ENCOUNTER — Encounter (HOSPITAL_COMMUNITY): Payer: Self-pay

## 2023-11-05 ENCOUNTER — Telehealth (HOSPITAL_COMMUNITY): Payer: Self-pay | Admitting: Emergency Medicine

## 2023-11-05 NOTE — Telephone Encounter (Signed)
 Reaching out to patient to offer assistance regarding upcoming cardiac imaging study; pt verbalizes understanding of appt date/time, parking situation and where to check in, pre-test NPO status and medications ordered, and verified current allergies; name and call back number provided for further questions should they arise Rockwell Alexandria RN Navigator Cardiac Imaging [facility] [phone] office [phone] cell

## 2023-11-05 NOTE — Telephone Encounter (Signed)
Attempted to call patient regarding upcoming cardiac PET appointment. Left message on voicemail with name and callback number Aidan Moten RN Navigator Cardiac Imaging [facility] [phone] Office [phone] Cell  

## 2023-11-06 ENCOUNTER — Ambulatory Visit (HOSPITAL_COMMUNITY)
Admission: RE | Admit: 2023-11-06 | Discharge: 2023-11-06 | Disposition: A | Discharge status: Home / Self Care | Payer: Medicare PPO | Source: Ambulatory Visit | Attending: Cardiology | Admitting: Cardiology

## 2023-11-06 DIAGNOSIS — I25118 Atherosclerotic heart disease of native coronary artery with other forms of angina pectoris: Secondary | ICD-10-CM | POA: Insufficient documentation

## 2023-11-06 LAB — NM PET CT CARDIAC PERFUSION MULTI W/ABSOLUTE BLOODFLOW
MBFR: 1.24
Nuc Rest EF: 59 %
Nuc Stress EF: 60 %
Rest MBF: 1.56 ml/g/min
Rest Nuclear Isotope Dose: 21.2 mCi
ST Depression (mm): 0 mm
Stress MBF: 1.93 ml/g/min
Stress Nuclear Isotope Dose: 21.2 mCi
TID: 1.05

## 2023-11-06 MED ORDER — REGADENOSON 0.4 MG/5ML IV SOLN: 0.4000 mg | Freq: Once | INTRAVENOUS | Status: DC

## 2023-11-06 MED ORDER — RUBIDIUM RB82 GENERATOR (RUBYFILL)
21.2100 | PACK | Freq: Once | INTRAVENOUS | Status: AC
  Administered 2023-11-06: 21.21 via INTRAVENOUS

## 2023-11-06 MED ORDER — RUBIDIUM RB82 GENERATOR (RUBYFILL)
21.1900 | PACK | Freq: Once | INTRAVENOUS | Status: AC
  Administered 2023-11-06: 21.19 via INTRAVENOUS

## 2023-11-06 MED ORDER — REGADENOSON 0.4 MG/5ML IV SOLN
INTRAVENOUS | Status: AC
  Filled 2023-11-06: qty 5

## 2023-11-06 MED ORDER — DEXTROSE 5 % IV SOLN
INTRAVENOUS | Status: AC
  Filled 2023-11-06: qty 50

## 2023-11-06 MED ORDER — CAFFEINE CITRATE BASE COMPONENT 10 MG/ML IV SOLN
INTRAVENOUS | Status: AC
  Filled 2023-11-06: qty 3

## 2023-11-06 NOTE — Progress Notes (Signed)
In addition to area of scar tissue, there is area that is alive and not getting adequate blood flow due to not having in the front artery.  Given your ongoing symptoms, would you like to proceed with diagnostic angiogram?  Based on the findings, we will then discuss percutaneous versus surgical revascularization options.  Thanks MJP

## 2023-11-15 ENCOUNTER — Encounter: Payer: Self-pay | Admitting: Cardiology

## 2023-11-15 ENCOUNTER — Ambulatory Visit: Payer: Medicare PPO | Attending: Cardiology | Admitting: Cardiology

## 2023-11-15 VITALS — BP 100/80 | HR 68 | Resp 16 | Ht 71.0 in | Wt 177.0 lb

## 2023-11-15 DIAGNOSIS — I25118 Atherosclerotic heart disease of native coronary artery with other forms of angina pectoris: Secondary | ICD-10-CM

## 2023-11-15 DIAGNOSIS — E782 Mixed hyperlipidemia: Secondary | ICD-10-CM | POA: Diagnosis not present

## 2023-11-15 DIAGNOSIS — I10 Essential (primary) hypertension: Secondary | ICD-10-CM

## 2023-11-15 NOTE — Progress Notes (Signed)
 Cardiology Office Note:  .   Date:  11/15/2023  ID:  Deeann Dowse, DOB 1951/08/10, MRN [id number] PCP: Gwenlyn Found, MD  [facility] Providers Cardiologist:  Truett Mainland, MD PCP: Gwenlyn Found, MD  Chief Complaint  Patient presents with   Coronary Artery Disease   Follow-up      History of Present Illness: .    CURTISS MAHMOOD is a 73 y.o. male with hypertension, hyperlipidemia, prediabetes, prior h/o heavy alcohol use, now with CAD s/p unsuccessful percutaneous revascularization complicated by coronary dissection and peri-procedural MI (07/26/2021), colon polyps without active bleeding  Since starting atenolol, patient is doing much better.  He has been walking up to 2 miles daily since the weather has gotten better, since the weather has gotten better, and absolutely denies any chest pain/anginal symptoms.Marland Kitchenm denies any chest pain/angina symptoms.   Vitals:   11/15/23 1138  BP: 100/80  Pulse: 68  Resp: 16  SpO2: 98%      ROS:  Review of Systems  Cardiovascular:  Positive for chest pain. Negative for dyspnea on exertion, leg swelling, palpitations and syncope.     Studies Reviewed: Marland Kitchen        EKG 11/15/2023: Sinus rhythm with occasional Premature ventricular complexes Nonspecific T wave abnormality When compared with ECG of 05-Oct-2022 16:25,  PVC is new    PET/CT stress test 11/06/2023:   Findings are consistent with infarction with peri-infarct moderate to severe LAD ischemia. The study is high risk due to MBFR 1.24 with LAD territory ischemia and associated reduced LAD stress flow   LV perfusion is abnormal. There is evidence of ischemia. There is evidence of infarction. Defect 1: There is a medium defect with severe reduction in uptake present in the apical to mid anterior and apex location(s) that is partially reversible. There is abnormal wall motion in the defect area. Consistent with peri-infarct ischemia.   Rest left ventricular function is  normal. Rest EF: 59%. Stress left ventricular function is normal. Stress EF: 60%. End diastolic cavity size is normal. End systolic cavity size is normal.   Myocardial blood flow was computed to be 1.74ml/g/min at rest and 1.66ml/g/min at stress. Global myocardial blood flow reserve was 1.24 and was abnormal.   Coronary calcium was present on the attenuation correction CT images. Severe coronary calcifications were present. Coronary calcifications were present in the left anterior descending artery, left circumflex artery and right coronary artery distribution(s).Aortic Valve calcification  Echocardiogram 10/13/2021: Normal LV systolic function with visual EF 55-60%. Left ventricle cavity is normal in size. Normal left ventricular wall thickness. Normal global wall motion. Normal diastolic filling pattern, normal LAP. Aortic valve sclerosis without stenosis. Trace aortic regurgitation. Mild (Grade I) mitral regurgitation. Mild tricuspid regurgitation. No evidence of pulmonary hypertension. Compared to study 07/27/2021: LVEF remains preserved, no obvious RWMA on current study, G1DD is now normal, otherwise no significant change.  Independently interpreted 09/2023: Chol 144, TG 52, HDL 76, LDL 57 Hb 15.3 Cr 0.7, Na 409    Physical Exam:   Physical Exam Vitals and nursing note reviewed.  Constitutional:      General: He is not in acute distress. Neck:     Vascular: No JVD.  Cardiovascular:     Rate and Rhythm: Normal rate and regular rhythm.     Heart sounds: Normal heart sounds. No murmur heard. Pulmonary:     Effort: Pulmonary effort is normal.     Breath sounds: Normal breath sounds. No wheezing or  rales.  Musculoskeletal:     Right lower leg: No edema.     Left lower leg: No edema.     VISIT DIAGNOSES:   ICD-10-CM   1. Coronary artery disease of native artery of native heart with stable angina pectoris (HCC)  I25.118 EKG 12-Lead    2. Essential hypertension  I10     3.  Mixed hyperlipidemia  E78.2         ASSESSMENT AND PLAN: .    DURK CARMEN is a 73 y.o. male with hypertension, hyperlipidemia, prediabetes, prior h/o heavy alcohol use, now with CAD s/p unsuccessful percutaneous revascularization complicated by coronary dissection and peri-procedural MI (07/26/2021), colon polyps without active bleeding   CAD: Known severe LAD disease with unsuccessful attempt at PCI in 2022. Stable angina symptoms. PET/CT stress test with anterolateral infarct with moderate peri-infarct ischemia. Currently on aspirin 100 mg daily, Lipitor 40 mg daily, atenolol 25 mg daily, Ranexa 1000 mg twice daily. He has had remarkable improvement in his symptoms since starting atenolol. Therefore, after detailed discussion, he has opted to continue medical therapy for CAD at this time. Should he have any recurrent angina symptoms in the future, we will proceed with diagnostic coronary angiogram.  Based on prior unsuccessful PCI attempt, presence of severe calcification and tortuosity, I do not think his coronary artery may would be suitable for PCI.  If single-vessel disease noted, he could be candidate for LIMA-LAD CABG with minithoracotomy.  Mixed hyperlipidemia: Continue Lipitor 40 mg. Chol 128, TG 58, HDL 67, LDL 48 (07/2022)  Hypertension:  Well-controlled.    F/u in 3 months  Signed, Elder Negus, MD

## 2023-11-15 NOTE — Patient Instructions (Signed)
 Medication Instructions:   *If you need a refill on your cardiac medications before your next appointment, please call your pharmacy*   Lab Work:  If you have labs (blood work) drawn today and your tests are completely normal, you will receive your results only by: MyChart Message (if you have MyChart) OR A paper copy in the mail If you have any lab test that is abnormal or we need to change your treatment, we will call you to review the results.   Testing/Procedures:    Follow-Up: At [facility], you and your health needs are our priority.  As part of our continuing mission to provide you with exceptional heart care, we have created designated Provider Care Teams.  These Care Teams include your primary Cardiologist (physician) and Advanced Practice Providers (APPs -  Physician Assistants and Nurse Practitioners) who all work together to provide you with the care you need, when you need it.  We recommend signing up for the patient portal called "MyChart".  Sign up information is provided on this After Visit Summary.  MyChart is used to connect with patients for Virtual Visits (Telemedicine).  Patients are able to view lab/test results, encounter notes, upcoming appointments, etc.  Non-urgent messages can be sent to your provider as well.   To learn more about what you can do with MyChart, go to [email or web address].    Your next appointment:   3 month(s)  DR [facility]     Other Instructions

## 2023-12-18 ENCOUNTER — Other Ambulatory Visit (HOSPITAL_COMMUNITY): Payer: Medicare PPO

## 2024-01-14 ENCOUNTER — Other Ambulatory Visit: Payer: Self-pay | Admitting: Cardiology

## 2024-01-14 DIAGNOSIS — I25118 Atherosclerotic heart disease of native coronary artery with other forms of angina pectoris: Secondary | ICD-10-CM

## 2024-01-14 DIAGNOSIS — I2510 Atherosclerotic heart disease of native coronary artery without angina pectoris: Secondary | ICD-10-CM

## 2024-01-16 ENCOUNTER — Other Ambulatory Visit: Payer: Self-pay | Admitting: Cardiology

## 2024-01-16 DIAGNOSIS — I25118 Atherosclerotic heart disease of native coronary artery with other forms of angina pectoris: Secondary | ICD-10-CM

## 2024-01-17 ENCOUNTER — Ambulatory Visit: Payer: Medicare PPO | Admitting: Cardiology

## 2024-01-30 ENCOUNTER — Other Ambulatory Visit: Payer: Self-pay | Admitting: Cardiology

## 2024-01-30 DIAGNOSIS — I10 Essential (primary) hypertension: Secondary | ICD-10-CM

## 2024-02-15 ENCOUNTER — Encounter: Payer: Self-pay | Admitting: Cardiology

## 2024-02-15 ENCOUNTER — Ambulatory Visit: Payer: Medicare PPO | Attending: Cardiology | Admitting: Cardiology

## 2024-02-15 VITALS — BP 144/80 | HR 63 | Resp 16 | Ht 71.0 in | Wt 175.6 lb

## 2024-02-15 DIAGNOSIS — E782 Mixed hyperlipidemia: Secondary | ICD-10-CM

## 2024-02-15 DIAGNOSIS — I25118 Atherosclerotic heart disease of native coronary artery with other forms of angina pectoris: Secondary | ICD-10-CM

## 2024-02-15 DIAGNOSIS — I10 Essential (primary) hypertension: Secondary | ICD-10-CM | POA: Diagnosis not present

## 2024-02-15 NOTE — Progress Notes (Signed)
 Cardiology Office Note:  .   Date:  02/15/2024  ID:  Edgar Garcia, DOB 1950/09/27, MRN [id number] PCP: Audria Leather, MD  [facility] Providers Cardiologist:  Fransico Ivy, MD PCP: Audria Leather, MD  Chief Complaint  Patient presents with   Coronary artery disease of native artery of native heart wi   Follow-up      History of Present Illness: .    Edgar Garcia is a 73 y.o. male with hypertension, hyperlipidemia, prediabetes, prior h/o heavy alcohol use, now with CAD s/p unsuccessful percutaneous revascularization complicated by coronary dissection and peri-procedural MI (07/26/2021), colon polyps without active bleeding  Patient continues to have chest/back pain with physical activity that improves with rest.  Recently, he is also started noticing being more aware of his breathing when he exerts himself.  Looking back, he states that his overall physical and functional capacity has significantly gone down over the last 4 years.  Separately, he continues to have dizziness symptoms, and generalized fatigue, which have been with him ever since he has been on beta-blockers.  Vitals:   02/15/24 0956  BP: (!) 144/80  Pulse: 63  Resp: 16  SpO2: 95%       ROS:  Review of Systems  Cardiovascular:  Positive for chest pain. Negative for dyspnea on exertion, leg swelling, palpitations and syncope.     Studies Reviewed: Edgar Garcia        EKG 11/15/2023: Sinus rhythm with occasional Premature ventricular complexes Nonspecific T wave abnormality When compared with ECG of 05-Oct-2022 16:25,  PVC is new    PET/CT stress test 11/06/2023:   Findings are consistent with infarction with peri-infarct moderate to severe LAD ischemia. The study is high risk due to MBFR 1.24 with LAD territory ischemia and associated reduced LAD stress flow   LV perfusion is abnormal. There is evidence of ischemia. There is evidence of infarction. Defect 1: There is a medium defect with severe  reduction in uptake present in the apical to mid anterior and apex location(s) that is partially reversible. There is abnormal wall motion in the defect area. Consistent with peri-infarct ischemia.   Rest left ventricular function is normal. Rest EF: 59%. Stress left ventricular function is normal. Stress EF: 60%. End diastolic cavity size is normal. End systolic cavity size is normal.   Myocardial blood flow was computed to be 1.2ml/g/min at rest and 1.64ml/g/min at stress. Global myocardial blood flow reserve was 1.24 and was abnormal.   Coronary calcium  was present on the attenuation correction CT images. Severe coronary calcifications were present. Coronary calcifications were present in the left anterior descending artery, left circumflex artery and right coronary artery distribution(s).Aortic Valve calcification  Echocardiogram 10/13/2021: Normal LV systolic function with visual EF 55-60%. Left ventricle cavity is normal in size. Normal left ventricular wall thickness. Normal global wall motion. Normal diastolic filling pattern, normal LAP. Aortic valve sclerosis without stenosis. Trace aortic regurgitation. Mild (Grade I) mitral regurgitation. Mild tricuspid regurgitation. No evidence of pulmonary hypertension. Compared to study 07/27/2021: LVEF remains preserved, no obvious RWMA on current study, G1DD is now normal, otherwise no significant change.  Independently interpreted 09/2023: Chol 144, TG 52, HDL 76, LDL 57 Hb 15.3 Cr 0.7, Na 578    Physical Exam:   Physical Exam Vitals and nursing note reviewed.  Constitutional:      General: He is not in acute distress. Neck:     Vascular: No JVD.  Cardiovascular:     Rate and  Rhythm: Normal rate and regular rhythm.     Heart sounds: Normal heart sounds. No murmur heard. Pulmonary:     Effort: Pulmonary effort is normal.     Breath sounds: Normal breath sounds. No wheezing or rales.  Musculoskeletal:     Right lower leg: No edema.      Left lower leg: No edema.     VISIT DIAGNOSES:   ICD-10-CM   1. Coronary artery disease of native artery of native heart with stable angina pectoris (HCC)  I25.118     2. Mixed hyperlipidemia  E78.2     3. Essential hypertension  I10          ASSESSMENT AND PLAN: .    Edgar Garcia is a 73 y.o. male with hypertension, hyperlipidemia, prediabetes, prior h/o heavy alcohol use, now with CAD s/p unsuccessful percutaneous revascularization complicated by coronary dissection and peri-procedural MI (07/26/2021), colon polyps without active bleeding   CAD: Known severe LAD disease with unsuccessful attempt at PCI in 2022. Stable angina symptoms. PET/CT stress test with anterolateral infarct with moderate peri-infarct ischemia (10/2023). Currently on aspirin  81 mg daily, Lipitor 40 mg daily, atenolol  25 mg daily, Ranexa  1000 mg twice daily. Patient continues to have exertional angina and dyspnea symptoms, along with significantly reduced baseline functional capacity. I do think a lot of his symptoms are indeed emanating from his obstructive CAD and significant peri-infarct ischemia in LAD territory. I reckon he may be a good candidate for robotic LIMA-LAD bypass surgery since other vessels do not have significant disease barring a small nondominant RCA. I recommend repeat coronary angiography for further decision-making. I reckon with successful revascularization, he may be able to come off a lot of his current antianginal medications.  Patient will to think about this and get back to me.  Mixed hyperlipidemia: Continue Lipitor 40 mg daily.  Hypertension:  Elevated today, but generally well-controlled on current antihypertensive therapy including amlodipine , lisinopril , along with antianginal therapy with atenolol .  Well-controlled.  I spent 30 minutes in the care of Edgar Garcia today including detailed discussion of medical, percutaneous, and surgical options for management of  coronary artery disease, emphasizing importance of repeat coronary angiography with possibility of a robotic LIMA-LAD bypass surgery, and documenting in the encounter.   F/u in 3 months  Signed, Cody Das, MD

## 2024-02-15 NOTE — Patient Instructions (Signed)
 Follow-Up: At [facility], you and your health needs are our priority.  As part of our continuing mission to provide you with exceptional heart care, our providers are all part of one team.  This team includes your primary Cardiologist (physician) and Advanced Practice Providers or APPs (Physician Assistants and Nurse Practitioners) who all work together to provide you with the care you need, when you need it.  Your next appointment:   3 month(s)  Provider:   Cody Das, MD

## 2024-02-18 ENCOUNTER — Telehealth: Payer: Self-pay | Admitting: Cardiology

## 2024-02-18 DIAGNOSIS — I25118 Atherosclerotic heart disease of native coronary artery with other forms of angina pectoris: Secondary | ICD-10-CM

## 2024-02-18 NOTE — Telephone Encounter (Signed)
 Please set up for left heart cath and coronary angiogram w/me at next available, and pre procedure labs as needed. Diagnosis: CAD w/stable angina  Thanks MJP

## 2024-02-18 NOTE — Telephone Encounter (Signed)
 Left message to call back

## 2024-02-18 NOTE — Telephone Encounter (Signed)
 Pt calling to set up appt for Cath placement. Please advise

## 2024-02-19 NOTE — Telephone Encounter (Signed)
 Called patient back. Schedule him for cath per Dr. Filiberto Hug on June 12th. Patient will get lab work this week or early next week. Patient given instructions. Will send message to pre-auth team.

## 2024-02-19 NOTE — Telephone Encounter (Signed)
 Pt returned call last night per After hours report.   Best number is [phone]

## 2024-02-22 LAB — CBC WITH DIFFERENTIAL/PLATELET

## 2024-02-23 LAB — CBC WITH DIFFERENTIAL/PLATELET
Basophils Absolute: 0 10*3/uL (ref 0.0–0.2)
Basos: 0 %
EOS (ABSOLUTE): 0.1 10*3/uL (ref 0.0–0.4)
Eos: 1 %
Hematocrit: 43.1 % (ref 37.5–51.0)
Hemoglobin: 14.6 g/dL (ref 13.0–17.7)
Immature Grans (Abs): 0.1 10*3/uL (ref 0.0–0.1)
Immature Granulocytes: 1 %
Lymphocytes Absolute: 1.3 10*3/uL (ref 0.7–3.1)
Lymphs: 19 %
MCH: 35.1 pg — ABNORMAL HIGH (ref 26.6–33.0)
MCHC: 33.9 g/dL (ref 31.5–35.7)
MCV: 104 fL — ABNORMAL HIGH (ref 79–97)
Monocytes Absolute: 0.8 10*3/uL (ref 0.1–0.9)
Monocytes: 12 %
Neutrophils Absolute: 4.6 10*3/uL (ref 1.4–7.0)
Neutrophils: 67 %
Platelets: 328 10*3/uL (ref 150–450)
RBC: 4.16 x10E6/uL (ref 4.14–5.80)
RDW: 11.7 % (ref 11.6–15.4)
WBC: 6.9 10*3/uL (ref 3.4–10.8)

## 2024-02-23 LAB — BASIC METABOLIC PANEL WITH GFR
BUN/Creatinine Ratio: 11 (ref 10–24)
BUN: 8 mg/dL (ref 8–27)
CO2: 23 mmol/L (ref 20–29)
Calcium: 9 mg/dL (ref 8.6–10.2)
Chloride: 94 mmol/L — ABNORMAL LOW (ref 96–106)
Creatinine, Ser: 0.73 mg/dL — ABNORMAL LOW (ref 0.76–1.27)
Glucose: 93 mg/dL (ref 70–99)
Potassium: 4.6 mmol/L (ref 3.5–5.2)
Sodium: 131 mmol/L — ABNORMAL LOW (ref 134–144)
eGFR: 95 mL/min/{1.73_m2} (ref >59)

## 2024-02-26 ENCOUNTER — Telehealth: Payer: Self-pay | Admitting: *Deleted

## 2024-02-26 NOTE — Telephone Encounter (Addendum)
 Cardiac Catheterization scheduled at [facility] for: Thursday February 28, 2024 10:30 AM Arrival time [facility] Main Entrance A at: 8:30 AM  Nothing to eat after midnight prior to procedure, clear liquids until 5 AM day of procedure.  Medication instructions: -Usual morning medications can be taken with sips of water including aspirin  81 mg.  Plan to go home the same day, you will only stay overnight if medically necessary.  You must have responsible adult to drive you home.  Someone must be with you the first 24 hours after you arrive home.  Reviewed procedure instructions with patient.

## 2024-02-28 ENCOUNTER — Telehealth: Payer: Self-pay

## 2024-02-28 ENCOUNTER — Encounter (HOSPITAL_COMMUNITY)
Admission: RE | Disposition: A | Discharge status: Home / Self Care | Payer: Self-pay | Source: Home / Self Care | Attending: Cardiology

## 2024-02-28 ENCOUNTER — Ambulatory Visit (HOSPITAL_COMMUNITY)
Admission: RE | Admit: 2024-02-28 | Discharge: 2024-02-28 | Disposition: A | Discharge status: Home / Self Care | Attending: Cardiology | Admitting: Cardiology

## 2024-02-28 ENCOUNTER — Other Ambulatory Visit: Payer: Self-pay

## 2024-02-28 DIAGNOSIS — I25118 Atherosclerotic heart disease of native coronary artery with other forms of angina pectoris: Secondary | ICD-10-CM | POA: Diagnosis present

## 2024-02-28 DIAGNOSIS — I2510 Atherosclerotic heart disease of native coronary artery without angina pectoris: Secondary | ICD-10-CM

## 2024-02-28 DIAGNOSIS — I2584 Coronary atherosclerosis due to calcified coronary lesion: Secondary | ICD-10-CM | POA: Insufficient documentation

## 2024-02-28 DIAGNOSIS — E782 Mixed hyperlipidemia: Secondary | ICD-10-CM | POA: Insufficient documentation

## 2024-02-28 DIAGNOSIS — I10 Essential (primary) hypertension: Secondary | ICD-10-CM | POA: Diagnosis not present

## 2024-02-28 DIAGNOSIS — Z7982 Long term (current) use of aspirin: Secondary | ICD-10-CM | POA: Insufficient documentation

## 2024-02-28 DIAGNOSIS — R7303 Prediabetes: Secondary | ICD-10-CM | POA: Diagnosis not present

## 2024-02-28 DIAGNOSIS — I252 Old myocardial infarction: Secondary | ICD-10-CM | POA: Insufficient documentation

## 2024-02-28 DIAGNOSIS — Z79899 Other long term (current) drug therapy: Secondary | ICD-10-CM | POA: Diagnosis not present

## 2024-02-28 HISTORY — PX: LEFT HEART CATH AND CORONARY ANGIOGRAPHY: CATH118249

## 2024-02-28 SURGERY — LEFT HEART CATH AND CORONARY ANGIOGRAPHY
Anesthesia: LOCAL

## 2024-02-28 MED ORDER — LABETALOL HCL 5 MG/ML IV SOLN: 10.0000 mg | INTRAVENOUS | Status: DC | PRN

## 2024-02-28 MED ORDER — MIDAZOLAM HCL 2 MG/2ML IJ SOLN
INTRAMUSCULAR | Status: DC | PRN
  Administered 2024-02-28: 1 mg via INTRAVENOUS

## 2024-02-28 MED ORDER — SODIUM CHLORIDE 0.9 % IV SOLN: 250.0000 mL | INTRAVENOUS | Status: DC | PRN

## 2024-02-28 MED ORDER — FENTANYL CITRATE (PF) 100 MCG/2ML IJ SOLN
INTRAMUSCULAR | Status: AC
Start: 2024-02-28 — End: 2024-02-28
  Filled 2024-02-28: qty 2

## 2024-02-28 MED ORDER — VERAPAMIL HCL 2.5 MG/ML IV SOLN
INTRAVENOUS | Status: AC
  Filled 2024-02-28: qty 2

## 2024-02-28 MED ORDER — HEPARIN SODIUM (PORCINE) 1000 UNIT/ML IJ SOLN
INTRAMUSCULAR | Status: AC
  Filled 2024-02-28: qty 10

## 2024-02-28 MED ORDER — LIDOCAINE HCL (PF) 1 % IJ SOLN
INTRAMUSCULAR | Status: AC
  Filled 2024-02-28: qty 30

## 2024-02-28 MED ORDER — HYDRALAZINE HCL 20 MG/ML IJ SOLN: 10.0000 mg | INTRAMUSCULAR | Status: DC | PRN

## 2024-02-28 MED ORDER — SODIUM CHLORIDE 0.9 % IV SOLN
INTRAVENOUS | Status: AC
Start: 2024-02-28 — End: 2024-02-28

## 2024-02-28 MED ORDER — SODIUM CHLORIDE 0.9% FLUSH: 3.0000 mL | Freq: Two times a day (BID) | INTRAVENOUS | Status: DC

## 2024-02-28 MED ORDER — HEPARIN (PORCINE) IN NACL 1000-0.9 UT/500ML-% IV SOLN
INTRAVENOUS | Status: DC | PRN
  Administered 2024-02-28: 1000 mL

## 2024-02-28 MED ORDER — SODIUM CHLORIDE 0.9 % WEIGHT BASED INFUSION: 1.0000 mL/kg/h | INTRAVENOUS | Status: DC

## 2024-02-28 MED ORDER — SODIUM CHLORIDE 0.9 % WEIGHT BASED INFUSION: 3.0000 mL/kg/h | INTRAVENOUS | Status: AC

## 2024-02-28 MED ORDER — MIDAZOLAM HCL 2 MG/2ML IJ SOLN
INTRAMUSCULAR | Status: AC
  Filled 2024-02-28: qty 2

## 2024-02-28 MED ORDER — SODIUM CHLORIDE 0.9% FLUSH: 3.0000 mL | INTRAVENOUS | Status: DC | PRN

## 2024-02-28 MED ORDER — LIDOCAINE HCL (PF) 1 % IJ SOLN
INTRAMUSCULAR | Status: DC | PRN
  Administered 2024-02-28: 2 mL

## 2024-02-28 MED ORDER — IOHEXOL 350 MG/ML SOLN
INTRAVENOUS | Status: DC | PRN
  Administered 2024-02-28: 40 mL

## 2024-02-28 MED ORDER — ACETAMINOPHEN 325 MG PO TABS
650.0000 mg | ORAL_TABLET | ORAL | Status: DC | PRN
Start: 2024-02-28 — End: 2024-02-28

## 2024-02-28 MED ORDER — ONDANSETRON HCL 4 MG/2ML IJ SOLN: 4.0000 mg | Freq: Four times a day (QID) | INTRAMUSCULAR | Status: DC | PRN

## 2024-02-28 MED ORDER — HEPARIN SODIUM (PORCINE) 1000 UNIT/ML IJ SOLN
INTRAMUSCULAR | Status: DC | PRN
  Administered 2024-02-28: 5000 [IU] via INTRAVENOUS

## 2024-02-28 MED ORDER — FENTANYL CITRATE (PF) 100 MCG/2ML IJ SOLN
INTRAMUSCULAR | Status: DC | PRN
Start: 2024-02-28 — End: 2024-02-28
  Administered 2024-02-28: 50 ug via INTRAVENOUS

## 2024-02-28 MED ORDER — ASPIRIN 81 MG PO CHEW: 81.0000 mg | CHEWABLE_TABLET | ORAL | Status: DC

## 2024-02-28 MED ORDER — VERAPAMIL HCL 2.5 MG/ML IV SOLN
INTRAVENOUS | Status: DC | PRN
  Administered 2024-02-28: 10 mL via INTRA_ARTERIAL

## 2024-02-28 SURGICAL SUPPLY — 8 items
CATH INFINITI AMBI 5FR TG (CATHETERS) IMPLANT
DEVICE RAD COMP TR BAND LRG (VASCULAR PRODUCTS) IMPLANT
GLIDESHEATH SLEND A-KIT 6F 22G (SHEATH) IMPLANT
GLIDESHEATH SLEND SS 6F .021 (SHEATH) IMPLANT
GUIDEWIRE INQWIRE 1.5J.035X260 (WIRE) IMPLANT
PACK CARDIAC CATHETERIZATION (CUSTOM PROCEDURE TRAY) ×1 IMPLANT
SET ATX-X65L (MISCELLANEOUS) IMPLANT
SHEATH PROBE COVER 6X72 (BAG) IMPLANT

## 2024-02-28 NOTE — H&P (Signed)
 OV 02/15/2024 copied for documentation   Cardiology Office Note:  .   Date:  02/28/2024  ID:  Pasqual Bone, DOB 20-Feb-1951, MRN [id number] PCP: Audria Leather, MD  [facility] Providers Cardiologist:  Fransico Ivy, MD PCP: Audria Leather, MD  C/C: Chest pain    History of Present Illness: .    NILES ESS is a 73 y.o. male with hypertension, hyperlipidemia, prediabetes, prior h/o heavy alcohol use, now with CAD s/p unsuccessful percutaneous revascularization complicated by coronary dissection and peri-procedural MI (07/26/2021), colon polyps without active bleeding  Patient continues to have chest/back pain with physical activity that improves with rest.  Recently, he is also started noticing being more aware of his breathing when he exerts himself.  Looking back, he states that his overall physical and functional capacity has significantly gone down over the last 4 years.  Separately, he continues to have dizziness symptoms, and generalized fatigue, which have been with him ever since he has been on beta-blockers.  Vitals:   02/28/24 0901 02/28/24 1155  BP: (!) 142/95   Pulse: 65   Resp: 18   Temp: 98.2 F (36.8 C)   SpO2: 98% 100%       ROS:  Review of Systems  Cardiovascular:  Positive for chest pain. Negative for dyspnea on exertion, leg swelling, palpitations and syncope.     Studies Reviewed: Aaron Aas        EKG 11/15/2023: Sinus rhythm with occasional Premature ventricular complexes Nonspecific T wave abnormality When compared with ECG of 05-Oct-2022 16:25,  PVC is new    PET/CT stress test 11/06/2023:   Findings are consistent with infarction with peri-infarct moderate to severe LAD ischemia. The study is high risk due to MBFR 1.24 with LAD territory ischemia and associated reduced LAD stress flow   LV perfusion is abnormal. There is evidence of ischemia. There is evidence of infarction. Defect 1: There is a medium defect with severe reduction in  uptake present in the apical to mid anterior and apex location(s) that is partially reversible. There is abnormal wall motion in the defect area. Consistent with peri-infarct ischemia.   Rest left ventricular function is normal. Rest EF: 59%. Stress left ventricular function is normal. Stress EF: 60%. End diastolic cavity size is normal. End systolic cavity size is normal.   Myocardial blood flow was computed to be 1.76ml/g/min at rest and 1.34ml/g/min at stress. Global myocardial blood flow reserve was 1.24 and was abnormal.   Coronary calcium  was present on the attenuation correction CT images. Severe coronary calcifications were present. Coronary calcifications were present in the left anterior descending artery, left circumflex artery and right coronary artery distribution(s).Aortic Valve calcification  Echocardiogram 10/13/2021: Normal LV systolic function with visual EF 55-60%. Left ventricle cavity is normal in size. Normal left ventricular wall thickness. Normal global wall motion. Normal diastolic filling pattern, normal LAP. Aortic valve sclerosis without stenosis. Trace aortic regurgitation. Mild (Grade I) mitral regurgitation. Mild tricuspid regurgitation. No evidence of pulmonary hypertension. Compared to study 07/27/2021: LVEF remains preserved, no obvious RWMA on current study, G1DD is now normal, otherwise no significant change.  Independently interpreted 09/2023: Chol 144, TG 52, HDL 76, LDL 57 Hb 15.3 Cr 0.7, Na 409    Physical Exam:   Physical Exam Vitals and nursing note reviewed.  Constitutional:      General: He is not in acute distress. Neck:     Vascular: No JVD.   Cardiovascular:     Rate  and Rhythm: Normal rate and regular rhythm.     Heart sounds: Normal heart sounds. No murmur heard. Pulmonary:     Effort: Pulmonary effort is normal.     Breath sounds: Normal breath sounds. No wheezing or rales.   Musculoskeletal:     Right lower leg: No edema.      Left lower leg: No edema.      VISIT DIAGNOSES: No diagnosis found.      ASSESSMENT AND PLAN: .    DALBERT STILLINGS is a 73 y.o. male with hypertension, hyperlipidemia, prediabetes, prior h/o heavy alcohol use, now with CAD s/p unsuccessful percutaneous revascularization complicated by coronary dissection and peri-procedural MI (07/26/2021), colon polyps without active bleeding   CAD: Known severe LAD disease with unsuccessful attempt at PCI in 2022. Stable angina symptoms. PET/CT stress test with anterolateral infarct with moderate peri-infarct ischemia (10/2023). Currently on aspirin  81 mg daily, Lipitor 40 mg daily, atenolol  25 mg daily, Ranexa  1000 mg twice daily. Patient continues to have exertional angina and dyspnea symptoms, along with significantly reduced baseline functional capacity. I do think a lot of his symptoms are indeed emanating from his obstructive CAD and significant peri-infarct ischemia in LAD territory. I reckon he may be a good candidate for robotic LIMA-LAD bypass surgery since other vessels do not have significant disease barring a small nondominant RCA. I recommend repeat coronary angiography for further decision-making. I reckon with successful revascularization, he may be able to come off a lot of his current antianginal medications.  Patient will to think about this and get back to me.  Mixed hyperlipidemia: Continue Lipitor 40 mg daily.  Hypertension:  Elevated today, but generally well-controlled on current antihypertensive therapy including amlodipine , lisinopril , along with antianginal therapy with atenolol .  Well-controlled.  I spent 30 minutes in the care of RENAUD CELLI today including detailed discussion of medical, percutaneous, and surgical options for management of coronary artery disease, emphasizing importance of repeat coronary angiography with possibility of a robotic LIMA-LAD bypass surgery, and documenting in the encounter.   F/u in 3  months  Signed, Cody Das, MD

## 2024-02-28 NOTE — Telephone Encounter (Signed)
 Contacted by Dr. Filiberto Hug and advised to place referral order for CVTS for patient to see Dr. Deloise Ferries for possible robotic LIMA-LAD bypass. Order has been placed.

## 2024-02-28 NOTE — Discharge Instructions (Signed)
 Radial Site Care  This sheet gives you information about how to care for yourself after your procedure. Your health care provider may also give you more specific instructions. If you have problems or questions, contact your health care provider. What can I expect after the procedure? After the procedure, it is common to have: Bruising and tenderness at the catheter insertion area. Follow these instructions at home: Medicines Take over-the-counter and prescription medicines only as told by your health care provider. Insertion site care Follow instructions from your health care provider about how to take care of your insertion site. Make sure you: Wash your hands with soap and water before you remove your bandage (dressing). If soap and water are not available, use hand sanitizer. May remove dressing in 24 hours. Check your insertion site every day for signs of infection. Check for: Redness, swelling, or pain. Fluid or blood. Pus or a bad smell. Warmth. Do no take baths, swim, or use a hot tub for 5 days. You may shower 24-48 hours after the procedure. Remove the dressing and gently wash the site with plain soap and water. Pat the area dry with a clean towel. Do not rub the site. That could cause bleeding. Do not apply powder or lotion to the site. Activity  For 24 hours after the procedure, or as directed by your health care provider: Do not flex or bend the affected arm. Do not push or pull heavy objects with the affected arm. Do not drive yourself home from the hospital or clinic. You may drive 24 hours after the procedure. Do not operate machinery or power tools. KEEP ARM ELEVATED THE REMAINDER OF THE DAY. Do not push, pull or lift anything that is heavier than 10 lb for 5 days. Ask your health care provider when it is okay to: Return to work or school. Resume usual physical activities or sports. Resume sexual activity. General instructions If the catheter site starts to  bleed, raise your arm and put firm pressure on the site. If the bleeding does not stop, get help right away. This is a medical emergency. DRINK PLENTY OF FLUIDS FOR THE NEXT 2-3 DAYS. No alcohol consumption for 24 hours after receiving sedation. If you went home on the same day as your procedure, a responsible adult should be with you for the first 24 hours after you arrive home. Keep all follow-up visits as told by your health care provider. This is important. Contact a health care provider if: You have a fever. You have redness, swelling, or yellow drainage around your insertion site. Get help right away if: You have unusual pain at the radial site. The catheter insertion area swells very fast. The insertion area is bleeding, and the bleeding does not stop when you hold steady pressure on the area. Your arm or hand becomes pale, cool, tingly, or numb. These symptoms may represent a serious problem that is an emergency. Do not wait to see if the symptoms will go away. Get medical help right away. Call your local emergency services (911 in the [place] Do not drive yourself to the hospital. Summary After the procedure, it is common to have bruising and tenderness at the site. Follow instructions from your health care provider about how to take care of your radial site wound. Check the wound every day for signs of infection.  This information is not intended to replace advice given to you by your health care provider. Make sure you discuss any questions you have with  your health care provider. Document Revised: 10/10/2017 Document Reviewed: 10/10/2017 Elsevier Patient Education  2020 [employer].

## 2024-02-28 NOTE — Progress Notes (Signed)
 Patient and wife was given discharge instructions. Both verbalized understanding.

## 2024-02-28 NOTE — Interval H&P Note (Signed)
 History and Physical Interval Note:  02/28/2024 12:00 PM  Edgar Garcia  has presented today for surgery, with the diagnosis of chest pain.  The various methods of treatment have been discussed with the patient and family. After consideration of risks, benefits and other options for treatment, the patient has consented to  Procedure(s): LEFT HEART CATH AND CORONARY ANGIOGRAPHY (N/A) as a surgical intervention.  The patient's history has been reviewed, patient examined, no change in status, stable for surgery.  I have reviewed the patient's chart and labs.  Questions were answered to the patient's satisfaction.     Ayari Liwanag J Sheylin Scharnhorst

## 2024-02-29 ENCOUNTER — Encounter (HOSPITAL_COMMUNITY): Payer: Self-pay | Admitting: Cardiology

## 2024-02-29 ENCOUNTER — Other Ambulatory Visit: Payer: Self-pay

## 2024-02-29 DIAGNOSIS — I25118 Atherosclerotic heart disease of native coronary artery with other forms of angina pectoris: Secondary | ICD-10-CM

## 2024-02-29 NOTE — Progress Notes (Unsigned)
 Needing to have a echo ordered per request from Dr. Raina Bunting office. Left message for patient to call back to be advised. Order has been placed for appointment to be scheduled.

## 2024-03-05 ENCOUNTER — Other Ambulatory Visit: Payer: Self-pay | Admitting: Cardiology

## 2024-04-01 ENCOUNTER — Other Ambulatory Visit: Payer: Self-pay | Admitting: Gastroenterology

## 2024-04-01 DIAGNOSIS — K259 Gastric ulcer, unspecified as acute or chronic, without hemorrhage or perforation: Secondary | ICD-10-CM

## 2024-04-01 DIAGNOSIS — D500 Iron deficiency anemia secondary to blood loss (chronic): Secondary | ICD-10-CM

## 2024-04-15 ENCOUNTER — Ambulatory Visit (HOSPITAL_COMMUNITY)
Admission: RE | Admit: 2024-04-15 | Discharge: 2024-04-15 | Disposition: A | Discharge status: Home / Self Care | Source: Ambulatory Visit | Attending: Internal Medicine | Admitting: Internal Medicine

## 2024-04-15 ENCOUNTER — Ambulatory Visit: Payer: Self-pay | Admitting: Cardiology

## 2024-04-15 DIAGNOSIS — I25118 Atherosclerotic heart disease of native coronary artery with other forms of angina pectoris: Secondary | ICD-10-CM | POA: Diagnosis present

## 2024-04-15 LAB — ECHOCARDIOGRAM COMPLETE
AR max vel: 1.63 cm2
AV Area VTI: 1.63 cm2
AV Area mean vel: 1.6 cm2
AV Mean grad: 8.2 mmHg
AV Peak grad: 15.1 mmHg
Ao pk vel: 1.94 m/s
Area-P 1/2: 3.53 cm2
S' Lateral: 3.2 cm

## 2024-04-17 NOTE — H&P (View-Only) (Signed)
 [address]       [place] [address]             [phone]        Edgar Garcia Medical Record #[id number] Date of Birth: May 19, 1951  Referring: Elmira Newman PARAS, MD Primary Care: Leila Lucie LABOR, MD Primary Cardiologist:Manish PARAS Elmira, MD  Chief Complaint:    Chief Complaint  Patient presents with   Coronary Artery Disease    New patient consultation, CATH 6/12, ECHO 7/29    History of Present Illness:     Edgar Garcia is a 73 y.o. 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  He has been symptomatic with exertional chest pain and shortness of breath.  He states that he has been having a hard time with his medication and would like to come off of some of them.     Past Medical and Surgical History: Previous Chest Surgery: no Previous Chest Radiation: no Diabetes Mellitus: no.  HbA1C 5.6 Creatinine:  Lab Results  Component Value Date   CREATININE 0.73 (L) 02/22/2024   CREATININE 0.70 (L) 10/12/2023   CREATININE 0.68 (L) 10/26/2022     Past Medical History:  Diagnosis Date   Anxiety    CAD (coronary artery disease)    GERD (gastroesophageal reflux disease)    Hyperlipidemia    Hypertension    Myocardial infarction (HCC)    TIA (transient ischemic attack)     Past Surgical History:  Procedure Laterality Date   CORONARY BALLOON ANGIOPLASTY N/A 07/26/2021   Procedure: CORONARY BALLOON ANGIOPLASTY;  Surgeon: Elmira Newman PARAS, MD;  Location: [facility];  Service: Cardiovascular;  Laterality: N/A;   LEFT HEART CATH AND CORONARY ANGIOGRAPHY N/A 07/26/2021   Procedure: LEFT HEART CATH AND CORONARY ANGIOGRAPHY;  Surgeon: Elmira Newman PARAS, MD;  Location: [facility];  Service: Cardiovascular;  Laterality: N/A;   LEFT HEART CATH AND CORONARY ANGIOGRAPHY N/A 02/28/2024   Procedure: LEFT HEART CATH AND CORONARY ANGIOGRAPHY;  Surgeon: Elmira Newman PARAS, MD;  Location: [facility];  Service:  Cardiovascular;  Laterality: N/A;    Social History:  Social History   Tobacco Use  Smoking Status Former   Current packs/day: 0.00   Average packs/day: 0.5 packs/day for 25.0 years (12.5 ttl pk-yrs)   Types: Cigarettes   Start date: 72   Quit date: 2013   Years since quitting: 12.5  Smokeless Tobacco Never    Social History   Substance and Sexual Activity  Alcohol Use Not Currently   Comment: stopped November 2022     Allergies  Allergen Reactions   Brilinta  [Ticagrelor ] Other (See Comments)    Headaches    Isordil  [Isosorbide  Nitrate] Other (See Comments)    Headaches    Medications: Asprin: yes Statin: yes Beta Blocker:  Ace Inhibitor: lisinopril  Anti-Coagulation: no  Current Outpatient Medications  Medication Sig Dispense Refill   amLODipine  (NORVASC ) 5 MG tablet Take 5 mg by mouth in the morning.     aspirin  81 MG EC tablet Take 81 mg by mouth in the morning.     atenolol  (TENORMIN ) 25 MG tablet Take 1 tablet (25 mg total) by mouth daily. 90 tablet 3   atorvastatin  (LIPITOR) 40 MG tablet TAKE 1 TABLET(40 MG) BY MOUTH EVERY EVENING 90 tablet 3   lisinopril  (ZESTRIL ) 20 MG tablet TAKE 1 TABLET BY MOUTH ONCE DAILY 90 tablet 3   nitroGLYCERIN  (NITROSTAT ) 0.4  MG SL tablet Place 1 tablet (0.4 mg total) under the tongue every 5 (five) minutes as needed for chest pain. 90 tablet 3   pantoprazole  (PROTONIX ) 40 MG tablet Take 1 tablet (40 mg total) by mouth 2 (two) times daily. Please schedule a yearly follow up for further refills. Thank you 60 tablet 1   ranolazine  (RANEXA ) 1000 MG SR tablet Take 1 tablet (1,000 mg total) by mouth 2 (two) times daily. 180 tablet 3   vitamin B-12 (CYANOCOBALAMIN) 1000 MCG tablet Take 1,000 mcg by mouth in the morning.     No current facility-administered medications for this visit.    (Not in a hospital admission)   Family History  Problem Relation Age of Onset   Cancer Mother    Hypertension Sister    Hypertension Sister     Hypertension Sister    Colon polyps Brother    Hypertension Brother    Colon cancer Neg Hx    Esophageal cancer Neg Hx    Rectal cancer Neg Hx    Stomach cancer Neg Hx      Review of Systems:   Review of Systems  Constitutional:  Positive for malaise/fatigue.  Respiratory:  Positive for shortness of breath.   Cardiovascular:  Positive for chest pain.  Neurological: Negative.       Physical Exam: BP (!) 185/83 (BP Location: Left Arm, Patient Position: Sitting, Cuff Size: Normal)   Pulse 81   Resp 20   Ht 5' 9 (1.753 m)   Wt 176 lb 8 oz (80.1 kg)   SpO2 98% Comment: RA  BMI 26.06 kg/m  Physical Exam Constitutional:      General: He is not in acute distress.    Appearance: He is not ill-appearing.  HENT:     Head: Normocephalic and atraumatic.  Eyes:     Extraocular Movements: Extraocular movements intact.  Cardiovascular:     Rate and Rhythm: Normal rate.  Pulmonary:     Effort: Pulmonary effort is normal. No respiratory distress.  Abdominal:     General: Abdomen is flat. There is no distension.  Musculoskeletal:        General: Normal range of motion.     Cervical back: Normal range of motion.  Skin:    General: Skin is warm and dry.  Neurological:     General: No focal deficit present.     Mental Status: He is alert and oriented to person, place, and time.       Diagnostic Studies & Laboratory data: Cardiac Studies & Procedures   ______________________________________________________________________________________________ CARDIAC CATHETERIZATION  CARDIAC CATHETERIZATION 02/28/2024  Conclusion Images from the original result were not included.    Prox LAD lesion is 70% stenosed.   Prox RCA lesion is 90% stenosed.   Mid LAD lesion is 99% stenosed.   LV end diastolic pressure is mildly elevated.   Recommend Aspirin  81mg  daily for moderate CAD.  Coronary angiography 02/28/2024: LM: Normal LAD: Severely calcified and tortuous vessel Proximal  diffuse 70% disease Mid 99% stenosis (likely healed dissection from previously unsuccessful PCI attempt) Distal vessel gets antegrade flow through a small channel Lcx: Large dominant vessel, no significant disease RCA: Small nondominant vessel, proximal 80% stenosis  LVEDP 26 mmHg    Functionally, single-vessel obstructive disease of LAD (severe stenosis in proximal RCA, but it is a small nondominant vessel) Anterolateral infarct, but moderate peri-infarct ischemia Patient continues to have angina symptoms on optimal medical therapy I do think he would benefit from revascularization  to LAD.  Best case scenario would be a robotic assisted LIMA-LAD graft.  The target for LIMA-LAD graft may well be quite distal.  I would refer him to Dr. Shyrl for consideration for this.  If he is not deemed to be candidate for robotic assisted LIMA-LAD graft, then revascularization option would include sternotomy LIMA-LAD graft versus repeat attempt of a very complex intervention.  [name], MD  Findings Coronary Findings Diagnostic  Dominance: Left  Left Anterior Descending Prox LAD lesion is 70% stenosed. The lesion is severely calcified. Mid LAD lesion is 99% stenosed. The lesion is severely calcified. The lesion was previously treated .  Right Coronary Artery Prox RCA lesion is 90% stenosed.  Intervention  No interventions have been documented.   CARDIAC CATHETERIZATION  CARDIAC CATHETERIZATION 07/26/2021  Conclusion Images from the original result were not included. LM: Normal LAD: Prox 70-75% severe calcific disease Mid LAD focal 95% stenosis Lcx: Dominant. No significant disease RCA: Non-ominant. Proximal 90% stenosis  Unsuccessful percutaneous coronary intervention mid LAD Dissection and partial vessel closure Ventricular fibrillation requiring emergent defibrillation Patient hemodynamically and electrically stable  Monitor in ICU overnight. Recommend  Aspirin /Aggrastat . Provided mo clinical deterioration overnight requiring emergency CABG (less likely), will load with Brilint ain AM.  Will consider repeat angiography and intervention in 3-4 weeks after healing of dissection.   [name], MD Pager: [phone] Office: [phone]  Findings Coronary Findings Diagnostic  Dominance: Left  Left Anterior Descending Prox LAD-1 lesion is 70% stenosed. The lesion is severely calcified. Prox LAD-2 lesion is 75% stenosed. The lesion is severely calcified. Mid LAD lesion is 95% stenosed. The lesion is severely calcified.  Right Coronary Artery Prox RCA lesion is 90% stenosed.  Intervention  Mid LAD lesion Angioplasty Balloon angioplasty was performed. Post-Intervention Lesion Assessment The intervention was unsuccessful. Pre-interventional TIMI flow is 2. Post-intervention TIMI flow is 1. At this lesion, a dissection and acute closure of the vessel occurred. Extremely complex anatomy with focal mid LAD 95% stenosis, proximal calcific 70-75% disease. Severe tortuosity and calcification made wiring challenging. I was unable to cross the lesion with Cougar, Prowater, and Whisper wires with Teleport support. There was likely a raised flap at the mid LAD lesion leading to dissection and vessel closure. I was able to cross the lesion with [facility], but possible entered subintimally. Unable to cross the lesion with teleport. I then decided to use 1.5 balloon with unsuccessful attempted angioplasty. At this point, patient went into VF arrest, was successfully defibrillated within 30 secs with partial restoration of flow. Patient had angina, but was hemodynamically stable. At this time, chances of successful luminal crossing were low. I decided to abort the procedure, admitted the patient to ICU for overnight monitoring. Will consider repeat angiography and internvetion in a few weeks after healing of the dissection site. There is a 95%  residual stenosis post intervention.   STRESS TESTS  NM PET CT CARDIAC PERFUSION MULTI W/ABSOLUTE BLOODFLOW 11/06/2023  Narrative   Findings are consistent with infarction with peri-infarct moderate to severe LAD ischemia. The study is high risk due to MBFR 1.24 with LAD territory ischemia and associated reduced LAD stress flow   LV perfusion is abnormal. There is evidence of ischemia. There is evidence of infarction. Defect 1: There is a medium defect with severe reduction in uptake present in the apical to mid anterior and apex location(s) that is partially reversible. There is abnormal wall motion in the defect area. Consistent with peri-infarct ischemia.  Rest left ventricular function is normal. Rest EF: 59%. Stress left ventricular function is normal. Stress EF: 60%. End diastolic cavity size is normal. End systolic cavity size is normal.   Myocardial blood flow was computed to be 1.29ml/g/min at rest and 1.49ml/g/min at stress. Global myocardial blood flow reserve was 1.24 and was abnormal.   Coronary calcium  was present on the attenuation correction CT images. Severe coronary calcifications were present. Coronary calcifications were present in the left anterior descending artery, left circumflex artery and right coronary artery distribution(s).Aortic Valve calcification   Electronically Signed  By: Stanly Leavens M.D.  CLINICAL DATA:  This over-read does not include interpretation of cardiac or coronary anatomy or pathology. The Cardiac PET CT interpretation by the cardiologist is attached.  COMPARISON:  None Available.  FINDINGS: Cardiovascular: Aortic valve calcifications. Normal heart size. Three-vessel coronary artery calcifications. No pericardial effusion.  Limited Mediastinum/Nodes: No enlarged mediastinal, hilar, or axillary lymph nodes. Trachea and esophagus demonstrate no significant findings.  Limited Lungs/Pleura: Lungs are clear. No pleural effusion  or pneumothorax.  Upper Abdomen: No acute abnormality.  Musculoskeletal: No chest wall abnormality. No acute osseous findings.  IMPRESSION: 1. No acute CT findings of the included chest. 2. Coronary artery disease. 3. Aortic valve calcifications. Correlate for echocardiographic evidence of aortic valve dysfunction.   Electronically Signed By: Marolyn JONETTA Jaksch M.D. On: 11/06/2023 09:40   ECHOCARDIOGRAM  ECHOCARDIOGRAM COMPLETE 04/15/2024  Narrative ECHOCARDIOGRAM REPORT    Patient Name:   EDIN KON  Date of Exam: 04/15/2024 Medical Rec #:  [id number]     Height:       69.0 in Accession #:    [id number]    Weight:       170.0 lb Date of Birth:  March 07, 1951      BSA:          1.928 m Patient Age:    73 years      BP:           144/80 mmHg Patient Gender: M             HR:           75 bpm. Exam Location:  [address]  Procedure: 2D Echo, Cardiac Doppler and Color Doppler (Both Spectral and Color Flow Doppler were utilized during procedure).  Indications:    I25.10 CAD  History:        Patient has prior history of Echocardiogram examinations, most recent 10/13/2021. CAD and Previous Myocardial Infarction, TIA; Risk Factors:Hypertension and Dyslipidemia.  Sonographer:    Carl Coma RDCS Referring Phys: [id number] [facility] J PATWARDHAN  IMPRESSIONS   1. Left ventricular ejection fraction, by estimation, is 55 to 60%. The left ventricle has normal function. The left ventricle has no regional wall motion abnormalities. Left ventricular diastolic parameters are indeterminate. 2. Right ventricular systolic function is normal. The right ventricular size is normal. There is mildly elevated pulmonary artery systolic pressure. 3. The mitral valve is degenerative. Mild mitral valve regurgitation. No evidence of mitral stenosis. 4. The aortic valve is calcified. Aortic valve regurgitation is not visualized. Aortic valve sclerosis/calcification is present, without any evidence  of aortic stenosis. 5. Aortic dilatation noted. There is mild dilatation of the ascending aorta, measuring 38 mm. 6. The inferior vena cava is normal in size with greater than 50% respiratory variability, suggesting right atrial pressure of 3 mmHg.  FINDINGS Left Ventricle: Left ventricular ejection fraction, by estimation, is 55 to 60%. The left ventricle has normal function. The  left ventricle has no regional wall motion abnormalities. The left ventricular internal cavity size was normal in size. There is no left ventricular hypertrophy. Left ventricular diastolic parameters are indeterminate.  Right Ventricle: The right ventricular size is normal. No increase in right ventricular wall thickness. Right ventricular systolic function is normal. There is mildly elevated pulmonary artery systolic pressure. The tricuspid regurgitant velocity is 3.09 m/s, and with an assumed right atrial pressure of 3 mmHg, the estimated right ventricular systolic pressure is 41.2 mmHg.  Left Atrium: Left atrial size was normal in size.  Right Atrium: Right atrial size was normal in size.  Pericardium: There is no evidence of pericardial effusion.  Mitral Valve: The mitral valve is degenerative in appearance. Mild mitral valve regurgitation. No evidence of mitral valve stenosis.  Tricuspid Valve: The tricuspid valve is normal in structure. Tricuspid valve regurgitation is not demonstrated. No evidence of tricuspid stenosis.  The aortic valve is calcified. Aortic valve regurgitation is not visualized. Aortic valve sclerosis/calcification is present, without any evidence of aortic stenosis. Pulmonic Valve: The pulmonic valve was normal in structure. Pulmonic valve regurgitation is not visualized. No evidence of pulmonic stenosis.  Aorta: Aortic dilatation noted. There is mild dilatation of the ascending aorta, measuring 38 mm.  Venous: The inferior vena cava is normal in size with greater than 50% respiratory  variability, suggesting right atrial pressure of 3 mmHg.  IAS/Shunts: No atrial level shunt detected by color flow Doppler.   LEFT VENTRICLE PLAX 2D LVIDd:         4.60 cm   Diastology LVIDs:         3.20 cm   LV e' medial:    6.58 cm/s LV PW:         0.70 cm   LV E/e' medial:  11.6 LV IVS:        0.70 cm   LV e' lateral:   11.85 cm/s LVOT diam:     2.10 cm   LV E/e' lateral: 6.5 LV SV:         75 LV SV Index:   39 LVOT Area:     3.46 cm   RIGHT VENTRICLE             IVC RV Basal diam:  4.10 cm     IVC diam: 1.10 cm RV S prime:     11.65 cm/s TAPSE (M-mode): 2.0 cm  LEFT ATRIUM             Index        RIGHT ATRIUM           Index LA diam:        3.70 cm 1.92 cm/m   RA Area:     13.50 cm LA Vol (A2C):   51.5 ml 26.71 ml/m  RA Volume:   31.60 ml  16.39 ml/m LA Vol (A4C):   40.4 ml 20.95 ml/m LA Biplane Vol: 49.8 ml 25.83 ml/m AORTIC VALVE AV Area (Vmax):    1.63 cm AV Area (Vmean):   1.60 cm AV Area (VTI):     1.63 cm AV Vmax:           194.00 cm/s AV Vmean:          131.600 cm/s AV VTI:            0.461 m AV Peak Grad:      15.1 mmHg AV Mean Grad:      8.2 mmHg LVOT Vmax:  91.35 cm/s LVOT Vmean:        60.900 cm/s LVOT VTI:          0.216 m LVOT/AV VTI ratio: 0.47  AORTA Ao Root diam: 3.50 cm Ao Asc diam:  3.80 cm  MITRAL VALVE               TRICUSPID VALVE MV Area (PHT): 3.53 cm    TR Peak grad:   38.2 mmHg MV Decel Time: 215 msec    TR Vmax:        309.00 cm/s MV E velocity: 76.50 cm/s MV A velocity: 70.70 cm/s  SHUNTS MV E/A ratio:  1.08        Systemic VTI:  0.22 m Systemic Diam: 2.10 cm  Kardie Tobb DO Electronically signed by Dub Huntsman DO Signature Date/Time: 04/15/2024/12:59:27 PM    Final          ______________________________________________________________________________________________     EKG: Sinus brady I have independently reviewed the above radiologic studies and discussed with the patient   Recent Lab  Findings: Lab Results  Component Value Date   WBC 6.9 02/22/2024   HGB 14.6 02/22/2024   HCT 43.1 02/22/2024   PLT 328 02/22/2024   GLUCOSE 93 02/22/2024   CHOL 144 10/12/2023   TRIG 52 10/12/2023   HDL 76 10/12/2023   LDLCALC 57 10/12/2023   ALT 21 03/27/2012   AST 25 03/27/2012   NA 131 (L) 02/22/2024   K 4.6 02/22/2024   CL 94 (L) 02/22/2024   CREATININE 0.73 (L) 02/22/2024   BUN 8 02/22/2024   CO2 23 02/22/2024   HGBA1C 5.6 07/27/2021    Intervention   Assessment / Plan:   73 y.o. male with 2V CAD.  He has a left dominant system.  Echocardiogram shows preserved biventricular function and no significant valvular disease.  The risks and benefits of off pump CABG X 2 (LAD, diag)  were discussed in detail.  The patient would like some time to consider his options.    Hold lisinopril  48hrs    I  spent 40 minutes counseling the patient face to face.   Linnie MALVA Rayas 04/21/2024 10:11 AM

## 2024-04-17 NOTE — Progress Notes (Signed)
 [address]       [place] [address]             [phone]        [name] Buda Medical Record #[id number] Date of Birth: May 19, 1951  Referring: Elmira Newman PARAS, MD Primary Care: Leila Lucie LABOR, MD Primary Cardiologist:Manish PARAS Elmira, MD  Chief Complaint:    Chief Complaint  Patient presents with   Coronary Artery Disease    New patient consultation, CATH 6/12, ECHO 7/29    History of Present Illness:     Edgar Garcia is a 73 y.o. 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  He has been symptomatic with exertional chest pain and shortness of breath.  He states that he has been having a hard time with his medication and would like to come off of some of them.     Past Medical and Surgical History: Previous Chest Surgery: no Previous Chest Radiation: no Diabetes Mellitus: no.  HbA1C 5.6 Creatinine:  Lab Results  Component Value Date   CREATININE 0.73 (L) 02/22/2024   CREATININE 0.70 (L) 10/12/2023   CREATININE 0.68 (L) 10/26/2022     Past Medical History:  Diagnosis Date   Anxiety    CAD (coronary artery disease)    GERD (gastroesophageal reflux disease)    Hyperlipidemia    Hypertension    Myocardial infarction (HCC)    TIA (transient ischemic attack)     Past Surgical History:  Procedure Laterality Date   CORONARY BALLOON ANGIOPLASTY N/A 07/26/2021   Procedure: CORONARY BALLOON ANGIOPLASTY;  Surgeon: Elmira Newman PARAS, MD;  Location: [facility];  Service: Cardiovascular;  Laterality: N/A;   LEFT HEART CATH AND CORONARY ANGIOGRAPHY N/A 07/26/2021   Procedure: LEFT HEART CATH AND CORONARY ANGIOGRAPHY;  Surgeon: Elmira Newman PARAS, MD;  Location: [facility];  Service: Cardiovascular;  Laterality: N/A;   LEFT HEART CATH AND CORONARY ANGIOGRAPHY N/A 02/28/2024   Procedure: LEFT HEART CATH AND CORONARY ANGIOGRAPHY;  Surgeon: Elmira Newman PARAS, MD;  Location: [facility];  Service:  Cardiovascular;  Laterality: N/A;    Social History:  Social History   Tobacco Use  Smoking Status Former   Current packs/day: 0.00   Average packs/day: 0.5 packs/day for 25.0 years (12.5 ttl pk-yrs)   Types: Cigarettes   Start date: 72   Quit date: 2013   Years since quitting: 12.5  Smokeless Tobacco Never    Social History   Substance and Sexual Activity  Alcohol Use Not Currently   Comment: stopped November 2022     Allergies  Allergen Reactions   Brilinta  [Ticagrelor ] Other (See Comments)    Headaches    Isordil  [Isosorbide  Nitrate] Other (See Comments)    Headaches    Medications: Asprin: yes Statin: yes Beta Blocker:  Ace Inhibitor: lisinopril  Anti-Coagulation: no  Current Outpatient Medications  Medication Sig Dispense Refill   amLODipine  (NORVASC ) 5 MG tablet Take 5 mg by mouth in the morning.     aspirin  81 MG EC tablet Take 81 mg by mouth in the morning.     atenolol  (TENORMIN ) 25 MG tablet Take 1 tablet (25 mg total) by mouth daily. 90 tablet 3   atorvastatin  (LIPITOR) 40 MG tablet TAKE 1 TABLET(40 MG) BY MOUTH EVERY EVENING 90 tablet 3   lisinopril  (ZESTRIL ) 20 MG tablet TAKE 1 TABLET BY MOUTH ONCE DAILY 90 tablet 3   nitroGLYCERIN  (NITROSTAT ) 0.4  MG SL tablet Place 1 tablet (0.4 mg total) under the tongue every 5 (five) minutes as needed for chest pain. 90 tablet 3   pantoprazole  (PROTONIX ) 40 MG tablet Take 1 tablet (40 mg total) by mouth 2 (two) times daily. Please schedule a yearly follow up for further refills. Thank you 60 tablet 1   ranolazine  (RANEXA ) 1000 MG SR tablet Take 1 tablet (1,000 mg total) by mouth 2 (two) times daily. 180 tablet 3   vitamin B-12 (CYANOCOBALAMIN) 1000 MCG tablet Take 1,000 mcg by mouth in the morning.     No current facility-administered medications for this visit.    (Not in a hospital admission)   Family History  Problem Relation Age of Onset   Cancer Mother    Hypertension Sister    Hypertension Sister     Hypertension Sister    Colon polyps Brother    Hypertension Brother    Colon cancer Neg Hx    Esophageal cancer Neg Hx    Rectal cancer Neg Hx    Stomach cancer Neg Hx      Review of Systems:   Review of Systems  Constitutional:  Positive for malaise/fatigue.  Respiratory:  Positive for shortness of breath.   Cardiovascular:  Positive for chest pain.  Neurological: Negative.       Physical Exam: BP (!) 185/83 (BP Location: Left Arm, Patient Position: Sitting, Cuff Size: Normal)   Pulse 81   Resp 20   Ht 5' 9 (1.753 m)   Wt 176 lb 8 oz (80.1 kg)   SpO2 98% Comment: RA  BMI 26.06 kg/m  Physical Exam Constitutional:      General: He is not in acute distress.    Appearance: He is not ill-appearing.  HENT:     Head: Normocephalic and atraumatic.  Eyes:     Extraocular Movements: Extraocular movements intact.  Cardiovascular:     Rate and Rhythm: Normal rate.  Pulmonary:     Effort: Pulmonary effort is normal. No respiratory distress.  Abdominal:     General: Abdomen is flat. There is no distension.  Musculoskeletal:        General: Normal range of motion.     Cervical back: Normal range of motion.  Skin:    General: Skin is warm and dry.  Neurological:     General: No focal deficit present.     Mental Status: He is alert and oriented to person, place, and time.       Diagnostic Studies & Laboratory data: Cardiac Studies & Procedures   ______________________________________________________________________________________________ CARDIAC CATHETERIZATION  CARDIAC CATHETERIZATION 02/28/2024  Conclusion Images from the original result were not included.    Prox LAD lesion is 70% stenosed.   Prox RCA lesion is 90% stenosed.   Mid LAD lesion is 99% stenosed.   LV end diastolic pressure is mildly elevated.   Recommend Aspirin  81mg  daily for moderate CAD.  Coronary angiography 02/28/2024: LM: Normal LAD: Severely calcified and tortuous vessel Proximal  diffuse 70% disease Mid 99% stenosis (likely healed dissection from previously unsuccessful PCI attempt) Distal vessel gets antegrade flow through a small channel Lcx: Large dominant vessel, no significant disease RCA: Small nondominant vessel, proximal 80% stenosis  LVEDP 26 mmHg    Functionally, single-vessel obstructive disease of LAD (severe stenosis in proximal RCA, but it is a small nondominant vessel) Anterolateral infarct, but moderate peri-infarct ischemia Patient continues to have angina symptoms on optimal medical therapy I do think he would benefit from revascularization  to LAD.  Best case scenario would be a robotic assisted LIMA-LAD graft.  The target for LIMA-LAD graft may well be quite distal.  I would refer him to Dr. Shyrl for consideration for this.  If he is not deemed to be candidate for robotic assisted LIMA-LAD graft, then revascularization option would include sternotomy LIMA-LAD graft versus repeat attempt of a very complex intervention.  [name], MD  Findings Coronary Findings Diagnostic  Dominance: Left  Left Anterior Descending Prox LAD lesion is 70% stenosed. The lesion is severely calcified. Mid LAD lesion is 99% stenosed. The lesion is severely calcified. The lesion was previously treated .  Right Coronary Artery Prox RCA lesion is 90% stenosed.  Intervention  No interventions have been documented.   CARDIAC CATHETERIZATION  CARDIAC CATHETERIZATION 07/26/2021  Conclusion Images from the original result were not included. LM: Normal LAD: Prox 70-75% severe calcific disease Mid LAD focal 95% stenosis Lcx: Dominant. No significant disease RCA: Non-ominant. Proximal 90% stenosis  Unsuccessful percutaneous coronary intervention mid LAD Dissection and partial vessel closure Ventricular fibrillation requiring emergent defibrillation Patient hemodynamically and electrically stable  Monitor in ICU overnight. Recommend  Aspirin /Aggrastat . Provided mo clinical deterioration overnight requiring emergency CABG (less likely), will load with Brilint ain AM.  Will consider repeat angiography and intervention in 3-4 weeks after healing of dissection.   [name], MD Pager: [phone] Office: [phone]  Findings Coronary Findings Diagnostic  Dominance: Left  Left Anterior Descending Prox LAD-1 lesion is 70% stenosed. The lesion is severely calcified. Prox LAD-2 lesion is 75% stenosed. The lesion is severely calcified. Mid LAD lesion is 95% stenosed. The lesion is severely calcified.  Right Coronary Artery Prox RCA lesion is 90% stenosed.  Intervention  Mid LAD lesion Angioplasty Balloon angioplasty was performed. Post-Intervention Lesion Assessment The intervention was unsuccessful. Pre-interventional TIMI flow is 2. Post-intervention TIMI flow is 1. At this lesion, a dissection and acute closure of the vessel occurred. Extremely complex anatomy with focal mid LAD 95% stenosis, proximal calcific 70-75% disease. Severe tortuosity and calcification made wiring challenging. I was unable to cross the lesion with Cougar, Prowater, and Whisper wires with Teleport support. There was likely a raised flap at the mid LAD lesion leading to dissection and vessel closure. I was able to cross the lesion with [facility], but possible entered subintimally. Unable to cross the lesion with teleport. I then decided to use 1.5 balloon with unsuccessful attempted angioplasty. At this point, patient went into VF arrest, was successfully defibrillated within 30 secs with partial restoration of flow. Patient had angina, but was hemodynamically stable. At this time, chances of successful luminal crossing were low. I decided to abort the procedure, admitted the patient to ICU for overnight monitoring. Will consider repeat angiography and internvetion in a few weeks after healing of the dissection site. There is a 95%  residual stenosis post intervention.   STRESS TESTS  NM PET CT CARDIAC PERFUSION MULTI W/ABSOLUTE BLOODFLOW 11/06/2023  Narrative   Findings are consistent with infarction with peri-infarct moderate to severe LAD ischemia. The study is high risk due to MBFR 1.24 with LAD territory ischemia and associated reduced LAD stress flow   LV perfusion is abnormal. There is evidence of ischemia. There is evidence of infarction. Defect 1: There is a medium defect with severe reduction in uptake present in the apical to mid anterior and apex location(s) that is partially reversible. There is abnormal wall motion in the defect area. Consistent with peri-infarct ischemia.  Rest left ventricular function is normal. Rest EF: 59%. Stress left ventricular function is normal. Stress EF: 60%. End diastolic cavity size is normal. End systolic cavity size is normal.   Myocardial blood flow was computed to be 1.29ml/g/min at rest and 1.49ml/g/min at stress. Global myocardial blood flow reserve was 1.24 and was abnormal.   Coronary calcium  was present on the attenuation correction CT images. Severe coronary calcifications were present. Coronary calcifications were present in the left anterior descending artery, left circumflex artery and right coronary artery distribution(s).Aortic Valve calcification   Electronically Signed  By: Stanly Leavens M.D.  CLINICAL DATA:  This over-read does not include interpretation of cardiac or coronary anatomy or pathology. The Cardiac PET CT interpretation by the cardiologist is attached.  COMPARISON:  None Available.  FINDINGS: Cardiovascular: Aortic valve calcifications. Normal heart size. Three-vessel coronary artery calcifications. No pericardial effusion.  Limited Mediastinum/Nodes: No enlarged mediastinal, hilar, or axillary lymph nodes. Trachea and esophagus demonstrate no significant findings.  Limited Lungs/Pleura: Lungs are clear. No pleural effusion  or pneumothorax.  Upper Abdomen: No acute abnormality.  Musculoskeletal: No chest wall abnormality. No acute osseous findings.  IMPRESSION: 1. No acute CT findings of the included chest. 2. Coronary artery disease. 3. Aortic valve calcifications. Correlate for echocardiographic evidence of aortic valve dysfunction.   Electronically Signed By: Marolyn JONETTA Jaksch M.D. On: 11/06/2023 09:40   ECHOCARDIOGRAM  ECHOCARDIOGRAM COMPLETE 04/15/2024  Narrative ECHOCARDIOGRAM REPORT    Patient Name:   Edgar Garcia  Date of Exam: 04/15/2024 Medical Rec #:  [id number]     Height:       69.0 in Accession #:    [id number]    Weight:       170.0 lb Date of Birth:  March 07, 1951      BSA:          1.928 m Patient Age:    73 years      BP:           144/80 mmHg Patient Gender: M             HR:           75 bpm. Exam Location:  [address]  Procedure: 2D Echo, Cardiac Doppler and Color Doppler (Both Spectral and Color Flow Doppler were utilized during procedure).  Indications:    I25.10 CAD  History:        Patient has prior history of Echocardiogram examinations, most recent 10/13/2021. CAD and Previous Myocardial Infarction, TIA; Risk Factors:Hypertension and Dyslipidemia.  Sonographer:    Carl Coma RDCS Referring Phys: [id number] [facility] J PATWARDHAN  IMPRESSIONS   1. Left ventricular ejection fraction, by estimation, is 55 to 60%. The left ventricle has normal function. The left ventricle has no regional wall motion abnormalities. Left ventricular diastolic parameters are indeterminate. 2. Right ventricular systolic function is normal. The right ventricular size is normal. There is mildly elevated pulmonary artery systolic pressure. 3. The mitral valve is degenerative. Mild mitral valve regurgitation. No evidence of mitral stenosis. 4. The aortic valve is calcified. Aortic valve regurgitation is not visualized. Aortic valve sclerosis/calcification is present, without any evidence  of aortic stenosis. 5. Aortic dilatation noted. There is mild dilatation of the ascending aorta, measuring 38 mm. 6. The inferior vena cava is normal in size with greater than 50% respiratory variability, suggesting right atrial pressure of 3 mmHg.  FINDINGS Left Ventricle: Left ventricular ejection fraction, by estimation, is 55 to 60%. The left ventricle has normal function. The  left ventricle has no regional wall motion abnormalities. The left ventricular internal cavity size was normal in size. There is no left ventricular hypertrophy. Left ventricular diastolic parameters are indeterminate.  Right Ventricle: The right ventricular size is normal. No increase in right ventricular wall thickness. Right ventricular systolic function is normal. There is mildly elevated pulmonary artery systolic pressure. The tricuspid regurgitant velocity is 3.09 m/s, and with an assumed right atrial pressure of 3 mmHg, the estimated right ventricular systolic pressure is 41.2 mmHg.  Left Atrium: Left atrial size was normal in size.  Right Atrium: Right atrial size was normal in size.  Pericardium: There is no evidence of pericardial effusion.  Mitral Valve: The mitral valve is degenerative in appearance. Mild mitral valve regurgitation. No evidence of mitral valve stenosis.  Tricuspid Valve: The tricuspid valve is normal in structure. Tricuspid valve regurgitation is not demonstrated. No evidence of tricuspid stenosis.  The aortic valve is calcified. Aortic valve regurgitation is not visualized. Aortic valve sclerosis/calcification is present, without any evidence of aortic stenosis. Pulmonic Valve: The pulmonic valve was normal in structure. Pulmonic valve regurgitation is not visualized. No evidence of pulmonic stenosis.  Aorta: Aortic dilatation noted. There is mild dilatation of the ascending aorta, measuring 38 mm.  Venous: The inferior vena cava is normal in size with greater than 50% respiratory  variability, suggesting right atrial pressure of 3 mmHg.  IAS/Shunts: No atrial level shunt detected by color flow Doppler.   LEFT VENTRICLE PLAX 2D LVIDd:         4.60 cm   Diastology LVIDs:         3.20 cm   LV e' medial:    6.58 cm/s LV PW:         0.70 cm   LV E/e' medial:  11.6 LV IVS:        0.70 cm   LV e' lateral:   11.85 cm/s LVOT diam:     2.10 cm   LV E/e' lateral: 6.5 LV SV:         75 LV SV Index:   39 LVOT Area:     3.46 cm   RIGHT VENTRICLE             IVC RV Basal diam:  4.10 cm     IVC diam: 1.10 cm RV S prime:     11.65 cm/s TAPSE (M-mode): 2.0 cm  LEFT ATRIUM             Index        RIGHT ATRIUM           Index LA diam:        3.70 cm 1.92 cm/m   RA Area:     13.50 cm LA Vol (A2C):   51.5 ml 26.71 ml/m  RA Volume:   31.60 ml  16.39 ml/m LA Vol (A4C):   40.4 ml 20.95 ml/m LA Biplane Vol: 49.8 ml 25.83 ml/m AORTIC VALVE AV Area (Vmax):    1.63 cm AV Area (Vmean):   1.60 cm AV Area (VTI):     1.63 cm AV Vmax:           194.00 cm/s AV Vmean:          131.600 cm/s AV VTI:            0.461 m AV Peak Grad:      15.1 mmHg AV Mean Grad:      8.2 mmHg LVOT Vmax:  91.35 cm/s LVOT Vmean:        60.900 cm/s LVOT VTI:          0.216 m LVOT/AV VTI ratio: 0.47  AORTA Ao Root diam: 3.50 cm Ao Asc diam:  3.80 cm  MITRAL VALVE               TRICUSPID VALVE MV Area (PHT): 3.53 cm    TR Peak grad:   38.2 mmHg MV Decel Time: 215 msec    TR Vmax:        309.00 cm/s MV E velocity: 76.50 cm/s MV A velocity: 70.70 cm/s  SHUNTS MV E/A ratio:  1.08        Systemic VTI:  0.22 m Systemic Diam: 2.10 cm  Kardie Tobb DO Electronically signed by Dub Huntsman DO Signature Date/Time: 04/15/2024/12:59:27 PM    Final          ______________________________________________________________________________________________     EKG: Sinus brady I have independently reviewed the above radiologic studies and discussed with the patient   Recent Lab  Findings: Lab Results  Component Value Date   WBC 6.9 02/22/2024   HGB 14.6 02/22/2024   HCT 43.1 02/22/2024   PLT 328 02/22/2024   GLUCOSE 93 02/22/2024   CHOL 144 10/12/2023   TRIG 52 10/12/2023   HDL 76 10/12/2023   LDLCALC 57 10/12/2023   ALT 21 03/27/2012   AST 25 03/27/2012   NA 131 (L) 02/22/2024   K 4.6 02/22/2024   CL 94 (L) 02/22/2024   CREATININE 0.73 (L) 02/22/2024   BUN 8 02/22/2024   CO2 23 02/22/2024   HGBA1C 5.6 07/27/2021    Intervention   Assessment / Plan:   73 y.o. male with 2V CAD.  He has a left dominant system.  Echocardiogram shows preserved biventricular function and no significant valvular disease.  The risks and benefits of off pump CABG X 2 (LAD, diag)  were discussed in detail.  The patient would like some time to consider his options.    Hold lisinopril  48hrs    I  spent 40 minutes counseling the patient face to face.   Linnie MALVA Rayas 04/21/2024 10:11 AM

## 2024-04-18 ENCOUNTER — Ambulatory Visit
Attending: Thoracic Surgery (Cardiothoracic Vascular Surgery) | Admitting: Thoracic Surgery (Cardiothoracic Vascular Surgery)

## 2024-04-18 ENCOUNTER — Encounter: Payer: Self-pay | Admitting: Thoracic Surgery (Cardiothoracic Vascular Surgery)

## 2024-04-18 VITALS — BP 185/83 | HR 81 | Resp 20 | Ht 69.0 in | Wt 176.5 lb

## 2024-04-18 DIAGNOSIS — I25118 Atherosclerotic heart disease of native coronary artery with other forms of angina pectoris: Secondary | ICD-10-CM

## 2024-04-24 ENCOUNTER — Telehealth: Payer: Self-pay | Admitting: Cardiology

## 2024-04-24 DIAGNOSIS — I10 Essential (primary) hypertension: Secondary | ICD-10-CM

## 2024-04-24 NOTE — Telephone Encounter (Signed)
 Patient identification verified by 2 forms.   Called and spoke to patient  Patient states:  -Seen Dr. Shyrl last week; BP 185/83 p 81 -later in the day was 106/61 p 60 -Monday BP was fine.  -Yesterday: anxious BP 170/80, than 120s -Today: 129/70 p 63, 162/89, 157/87  -Pt is concerned since blood pressure is higher than normal for him.   Patient denies:  -SOB  -Chest pain   Interventions/Plan: -Pt will monitor symptoms -Will try to walk since walking helps with his anxiety.   Reviewed ED warning signs/precautions  Patient agrees with plan, no questions at this time

## 2024-04-24 NOTE — Telephone Encounter (Signed)
 Pt c/o BP issue: STAT if pt c/o blurred vision, one-sided weakness or slurred speech  1. What are your last 5 BP readings? 129/79 HR 63; 157/87 HR 68  2. Are you having any other symptoms (ex. Dizziness, headache, blurred vision, passed out)? None   3. What is your BP issue? Feeling anxious

## 2024-04-24 NOTE — Telephone Encounter (Signed)
 I do suspect blood pressure spikes may be correlated with spikes of anxiety or whitecoat hypertension. We could use hydralazine  50 mg 3 times daily as needed for such blood pressure spikes without adding a medication for daily use. Hope that helps.  Also, please tell the patient I have reviewed notes from his visit with Dr. Shyrl.  I will discuss with Dr. Shyrl further.  Thanks MJP

## 2024-04-25 MED ORDER — HYDRALAZINE HCL 50 MG PO TABS: 50.0000 mg | ORAL_TABLET | Freq: Three times a day (TID) | ORAL | 3 refills | Status: DC | PRN

## 2024-04-25 NOTE — Telephone Encounter (Signed)
 Spoke with pt and explained Dr. Gilda recommendations of adding Hydralazine  50 mg TID PRN for HTN episodes. Pt verbalized understanding of plan and agreed to plan. Rx has been sent to pt preferred pharmacy.

## 2024-04-28 ENCOUNTER — Other Ambulatory Visit: Payer: Self-pay | Admitting: *Deleted

## 2024-04-28 DIAGNOSIS — I25118 Atherosclerotic heart disease of native coronary artery with other forms of angina pectoris: Secondary | ICD-10-CM

## 2024-04-29 ENCOUNTER — Encounter: Payer: Self-pay | Admitting: *Deleted

## 2024-05-01 ENCOUNTER — Telehealth: Payer: Self-pay | Admitting: Gastroenterology

## 2024-05-01 NOTE — Telephone Encounter (Signed)
 Edgar Garcia was trying to reduce some of his medications and called to inquire about the safety of taking long term PPI. I reassured the patient that Pantoprazole  was safe to continue long term when prescribed by a physician.  Patient advised he has a hx of gastric ulcers and that he should discuss with Dr. Stacia prior to discontinuing his PPI.  Patient is encouraged to schedule an office visit to discuss.  He reported he is having open heart surgery coming up and will make an appointment with Dr, Stacia after he has his surgery and has recovered.

## 2024-05-01 NOTE — Telephone Encounter (Signed)
 Patient called requesting a call to discuss pantoprazole  medication. Patient states he does not believe he should be on medication this long. Advised patient it looks like he would be due for a follow up visit to discuss medications. Patient does not wish to come in for appointment and would like to be advised further through the phone with a nurse. Please advise, thank you

## 2024-05-01 NOTE — Telephone Encounter (Signed)
 Left message for patient to call back

## 2024-05-05 NOTE — Pre-Procedure Instructions (Signed)
 Surgical Instructions   Your procedure is scheduled on May 08, 2024. Report to [facility] Main Entrance A at 5:30 A.M., then check in with the Admitting office. Any questions or running late day of surgery: call [phone]  Questions prior to your surgery date: call [phone], Monday-Friday, 8am-4pm. If you experience any cold or flu symptoms such as cough, fever, chills, shortness of breath, etc. between now and your scheduled surgery, please notify us  at the above number.     Remember:  Do not eat or drink after midnight the night before your surgery    Take these medicines the morning of surgery with A SIP OF WATER: amLODipine  (NORVASC )  atenolol  (TENORMIN )  pantoprazole  (PROTONIX )  ranolazine  (RANEXA )    May take these medicines IF NEEDED: hydrALAZINE  (APRESOLINE )  nitroGLYCERIN  (NITROSTAT ) - please call [phone] if dose taken prior to surgery   STOP taking your lisinopril  (ZESTRIL ) two days prior to surgery. Your last dose will be August 18th.  Continue taking your Aspirin  through the day before surgery. DO NOT take any the morning of surgery.   One week prior to surgery, STOP taking any Aleve, Naproxen, Ibuprofen, Motrin, Advil, Goody's, BC's, all herbal medications, fish oil, and non-prescription vitamins.                     Do NOT Smoke (Tobacco/Vaping) for 24 hours prior to your procedure.  If you use a CPAP at night, you may bring your mask/headgear for your overnight stay.   You will be asked to remove any contacts, glasses, piercing's, hearing aid's, dentures/partials prior to surgery. Please bring cases for these items if needed.    Patients discharged the day of surgery will not be allowed to drive home, and someone needs to stay with them for 24 hours.  SURGICAL WAITING ROOM VISITATION Patients may have no more than 2 support people in the waiting area - these visitors may rotate.   Pre-op nurse will coordinate an appropriate time for 1 ADULT  support person, who may not rotate, to accompany patient in pre-op.  Children under the age of 56 must have an adult with them who is not the patient and must remain in the main waiting area with an adult.  If the patient needs to stay at the hospital during part of their recovery, the visitor guidelines for inpatient rooms apply.  Please refer to the [facility] website for the visitor guidelines for any additional information.   If you received a COVID test during your pre-op visit  it is requested that you wear a mask when out in public, stay away from anyone that may not be feeling well and notify your surgeon if you develop symptoms. If you have been in contact with anyone that has tested positive in the last 10 days please notify you surgeon.      Pre-operative CHG Bathing Instructions   You can play a key role in reducing the risk of infection after surgery. Your skin needs to be as free of germs as possible. You can reduce the number of germs on your skin by washing with CHG (chlorhexidine  gluconate) soap before surgery. CHG is an antiseptic soap that kills germs and continues to kill germs even after washing.   DO NOT use if you have an allergy to chlorhexidine /CHG or antibacterial soaps. If your skin becomes reddened or irritated, stop using the CHG and notify one of our RNs at [phone].  TAKE A SHOWER THE NIGHT BEFORE SURGERY AND THE DAY OF SURGERY    Please keep in mind the following:  DO NOT shave, including legs and underarms, 48 hours prior to surgery.   You may shave your face before/day of surgery.  Place clean sheets on your bed the night before surgery Use a clean washcloth (not used since being washed) for each shower. DO NOT sleep with pet's night before surgery.  CHG Shower Instructions:  Wash your face and private area with normal soap. If you choose to wash your hair, wash first with your normal shampoo.  After you use shampoo/soap, rinse your  hair and body thoroughly to remove shampoo/soap residue.  Turn the water OFF and apply half the bottle of CHG soap to a CLEAN washcloth.  Apply CHG soap ONLY FROM YOUR NECK DOWN TO YOUR TOES (washing for 3-5 minutes)  DO NOT use CHG soap on face, private areas, open wounds, or sores.  Pay special attention to the area where your surgery is being performed.  If you are having back surgery, having someone wash your back for you may be helpful. Wait 2 minutes after CHG soap is applied, then you may rinse off the CHG soap.  Pat dry with a clean towel  Put on clean pajamas    Additional instructions for the day of surgery: DO NOT APPLY any lotions, deodorants, cologne, or perfumes.   Do not wear jewelry or makeup Do not wear nail polish, gel polish, artificial nails, or any other type of covering on natural nails (fingers and toes) Do not bring valuables to the hospital. [facility] is not responsible for valuables/personal belongings. Put on clean/comfortable clothes.  Please brush your teeth.  Ask your nurse before applying any prescription medications to the skin.

## 2024-05-06 ENCOUNTER — Ambulatory Visit (HOSPITAL_COMMUNITY)
Admission: RE | Admit: 2024-05-06 | Discharge: 2024-05-06 | Disposition: A | Discharge status: Home / Self Care | Source: Ambulatory Visit | Attending: Thoracic Surgery (Cardiothoracic Vascular Surgery) | Admitting: Thoracic Surgery (Cardiothoracic Vascular Surgery)

## 2024-05-06 ENCOUNTER — Encounter (HOSPITAL_COMMUNITY): Payer: Self-pay

## 2024-05-06 ENCOUNTER — Telehealth: Payer: Self-pay | Admitting: Cardiology

## 2024-05-06 ENCOUNTER — Encounter: Payer: Self-pay | Admitting: Cardiology

## 2024-05-06 ENCOUNTER — Other Ambulatory Visit: Payer: Self-pay

## 2024-05-06 ENCOUNTER — Ambulatory Visit (INDEPENDENT_AMBULATORY_CARE_PROVIDER_SITE_OTHER): Admitting: Cardiology

## 2024-05-06 ENCOUNTER — Inpatient Hospital Stay (HOSPITAL_BASED_OUTPATIENT_CLINIC_OR_DEPARTMENT_OTHER)
Admission: RE | Admit: 2024-05-06 | Discharge: 2024-05-06 | Disposition: A | Discharge status: Home / Self Care | Source: Ambulatory Visit | Attending: Thoracic Surgery (Cardiothoracic Vascular Surgery) | Admitting: Thoracic Surgery (Cardiothoracic Vascular Surgery)

## 2024-05-06 ENCOUNTER — Encounter (HOSPITAL_COMMUNITY)
Admission: RE | Admit: 2024-05-06 | Discharge: 2024-05-06 | Disposition: A | Discharge status: Home / Self Care | Source: Ambulatory Visit | Attending: Thoracic Surgery (Cardiothoracic Vascular Surgery) | Admitting: Thoracic Surgery (Cardiothoracic Vascular Surgery)

## 2024-05-06 VITALS — BP 138/72 | HR 71 | Ht 68.5 in | Wt 169.4 lb

## 2024-05-06 VITALS — BP 156/83 | HR 100 | Temp 98.0°F | Resp 18 | Ht 69.0 in | Wt 167.0 lb

## 2024-05-06 DIAGNOSIS — E871 Hypo-osmolality and hyponatremia: Secondary | ICD-10-CM

## 2024-05-06 DIAGNOSIS — E782 Mixed hyperlipidemia: Secondary | ICD-10-CM | POA: Insufficient documentation

## 2024-05-06 DIAGNOSIS — R55 Syncope and collapse: Secondary | ICD-10-CM | POA: Insufficient documentation

## 2024-05-06 DIAGNOSIS — I25118 Atherosclerotic heart disease of native coronary artery with other forms of angina pectoris: Secondary | ICD-10-CM | POA: Insufficient documentation

## 2024-05-06 DIAGNOSIS — Z01818 Encounter for other preprocedural examination: Secondary | ICD-10-CM

## 2024-05-06 DIAGNOSIS — I10 Essential (primary) hypertension: Secondary | ICD-10-CM

## 2024-05-06 HISTORY — DX: Other psychoactive substance abuse, uncomplicated: F19.10

## 2024-05-06 HISTORY — DX: Cerebral infarction, unspecified: I63.9

## 2024-05-06 HISTORY — DX: Angina pectoris, unspecified: I20.9

## 2024-05-06 HISTORY — DX: Unspecified osteoarthritis, unspecified site: M19.90

## 2024-05-06 LAB — CBC
HCT: 45 % (ref 39.0–52.0)
Hemoglobin: 16 g/dL (ref 13.0–17.0)
MCH: 35.1 pg — ABNORMAL HIGH (ref 26.0–34.0)
MCHC: 35.6 g/dL (ref 30.0–36.0)
MCV: 98.7 fL (ref 80.0–100.0)
Platelets: 287 K/uL (ref 150–400)
RBC: 4.56 MIL/uL (ref 4.22–5.81)
RDW: 11.7 % (ref 11.5–15.5)
WBC: 7.3 K/uL (ref 4.0–10.5)
nRBC: 0 % (ref 0.0–0.2)

## 2024-05-06 LAB — URINALYSIS, ROUTINE W REFLEX MICROSCOPIC
Bilirubin Urine: NEGATIVE
Glucose, UA: NEGATIVE mg/dL
Hgb urine dipstick: NEGATIVE
Ketones, ur: NEGATIVE mg/dL
Leukocytes,Ua: NEGATIVE
Nitrite: NEGATIVE
Protein, ur: NEGATIVE mg/dL
Specific Gravity, Urine: 1.009 (ref 1.005–1.030)
pH: 7 (ref 5.0–8.0)

## 2024-05-06 LAB — TYPE AND SCREEN
ABO/RH(D): AB NEG
Antibody Screen: NEGATIVE

## 2024-05-06 LAB — COMPREHENSIVE METABOLIC PANEL WITH GFR
ALT: 15 U/L (ref 0–44)
AST: 20 U/L (ref 15–41)
Albumin: 4.4 g/dL (ref 3.5–5.0)
Alkaline Phosphatase: 68 U/L (ref 38–126)
Anion gap: 9 (ref 5–15)
BUN: 7 mg/dL — ABNORMAL LOW (ref 8–23)
CO2: 25 mmol/L (ref 22–32)
Calcium: 9.1 mg/dL (ref 8.9–10.3)
Chloride: 96 mmol/L — ABNORMAL LOW (ref 98–111)
Creatinine, Ser: 0.83 mg/dL (ref 0.61–1.24)
GFR, Estimated: >60 mL/min (ref >60)
Glucose, Bld: 111 mg/dL — ABNORMAL HIGH (ref 70–99)
Potassium: 3.9 mmol/L (ref 3.5–5.1)
Sodium: 130 mmol/L — ABNORMAL LOW (ref 135–145)
Total Bilirubin: 1.1 mg/dL (ref 0.0–1.2)
Total Protein: 7.3 g/dL (ref 6.5–8.1)

## 2024-05-06 LAB — HEMOGLOBIN A1C
Hgb A1c MFr Bld: 5.3 % (ref 4.8–5.6)
Mean Plasma Glucose: 105.41 mg/dL

## 2024-05-06 LAB — APTT: aPTT: 32 s (ref 24–36)

## 2024-05-06 LAB — VAS US DOPPLER PRE CABG
Left ABI: 1.18
Right ABI: 1.19

## 2024-05-06 LAB — SURGICAL PCR SCREEN
MRSA, PCR: NEGATIVE
Staphylococcus aureus: NEGATIVE

## 2024-05-06 LAB — PROTIME-INR
INR: 1 (ref 0.8–1.2)
Prothrombin Time: 13.8 s (ref 11.4–15.2)

## 2024-05-06 NOTE — Progress Notes (Unsigned)
 Cardiology Office Note:  .   Date:  05/06/2024  ID:  Edgar Garcia, DOB October 02, 1950, MRN [id number] PCP: Leila Lucie LABOR, MD   [facility] Providers Cardiologist:  Newman Lawrence, MD PCP: Leila Lucie LABOR, MD  No chief complaint on file.     History of Present Illness: .    Edgar Garcia is a 73 y.o. male with hypertension, hyperlipidemia, prediabetes, prior h/o heavy alcohol use, now with CAD s/p unsuccessful percutaneous revascularization complicated by coronary dissection and peri-procedural MI (07/26/2021), colon polyps without active bleeding  Patient is here today for an urgent visit.  He had 2 syncopal episodes yesterday.  Patient reports that he took his Dulcolax as usual, drank water, and ate ***.  At 430, he was working at his art bench when he had sudden abdominal cramping, dizziness, followed by loss of consciousness.  He woke up, felt better and drove himself home.  He went home.  After sitting in the chair, he went to the bathroom, urinated once following which he called dizzy.  He called his wife for help, but did not lose consciousness in the meantime.  Vitals:   05/06/24 1340 05/06/24 1341  BP:    Pulse:    SpO2: 99% 96%     Orthostatic VS for the past 72 hrs (Last 3 readings):  Orthostatic BP Patient Position BP Location Cuff Size Orthostatic Pulse  05/06/24 1341 129/81 Standing Left Arm Normal 64  05/06/24 1340 112/70 Standing Left Arm Normal 67  05/06/24 1339 115/76 Sitting Left Arm Normal 61      ROS:  Review of Systems  Cardiovascular:  Positive for chest pain. Negative for dyspnea on exertion, leg swelling, palpitations and syncope.     Studies Reviewed: SABRA        EKG 05/06/2024: Sinus rhythm 64 bpm Normal EKG    PET/CT stress test 11/06/2023:   Findings are consistent with infarction with peri-infarct moderate to severe LAD ischemia. The study is high risk due to MBFR 1.24 with LAD territory ischemia and associated reduced LAD stress  flow   LV perfusion is abnormal. There is evidence of ischemia. There is evidence of infarction. Defect 1: There is a medium defect with severe reduction in uptake present in the apical to mid anterior and apex location(s) that is partially reversible. There is abnormal wall motion in the defect area. Consistent with peri-infarct ischemia.   Rest left ventricular function is normal. Rest EF: 59%. Stress left ventricular function is normal. Stress EF: 60%. End diastolic cavity size is normal. End systolic cavity size is normal.   Myocardial blood flow was computed to be 1.5ml/g/min at rest and 1.18ml/g/min at stress. Global myocardial blood flow reserve was 1.24 and was abnormal.   Coronary calcium  was present on the attenuation correction CT images. Severe coronary calcifications were present. Coronary calcifications were present in the left anterior descending artery, left circumflex artery and right coronary artery distribution(s).Aortic Valve calcification  Echocardiogram 10/13/2021: Normal LV systolic function with visual EF 55-60%. Left ventricle cavity is normal in size. Normal left ventricular wall thickness. Normal global wall motion. Normal diastolic filling pattern, normal LAP. Aortic valve sclerosis without stenosis. Trace aortic regurgitation. Mild (Grade I) mitral regurgitation. Mild tricuspid regurgitation. No evidence of pulmonary hypertension. Compared to study 07/27/2021: LVEF remains preserved, no obvious RWMA on current study, G1DD is now normal, otherwise no significant change.  Labs 04/2024: HbA1C 5.3% Hb 16.0 Cr 0.83, Na 130   09/2023: Chol 144, TG 52,  HDL 76, LDL 57 Hb 15.3 Cr 0.7, Na 867    Physical Exam:   Physical Exam Vitals and nursing note reviewed.  Constitutional:      General: He is not in acute distress. Neck:     Vascular: No JVD.  Cardiovascular:     Rate and Rhythm: Normal rate and regular rhythm.     Heart sounds: Normal heart sounds. No murmur  heard. Pulmonary:     Effort: Pulmonary effort is normal.     Breath sounds: Normal breath sounds. No wheezing or rales.  Musculoskeletal:     Right lower leg: No edema.     Left lower leg: No edema.      VISIT DIAGNOSES: No diagnosis found.      ASSESSMENT AND PLAN: .    [name] is a 73 y.o. male with hypertension, hyperlipidemia, prediabetes, prior h/o heavy alcohol use, now with CAD s/p unsuccessful percutaneous revascularization complicated by coronary dissection and peri-procedural MI (07/26/2021), colon polyps without active bleeding   CAD: Known severe LAD disease with unsuccessful attempt at PCI in 2022. Stable angina symptoms. PET/CT stress test with anterolateral infarct with moderate peri-infarct ischemia (10/2023). Currently on aspirin  81 mg daily, Lipitor 40 mg daily, atenolol  25 mg daily, Ranexa  1000 mg twice daily. Patient continues to have exertional angina and dyspnea symptoms, along with significantly reduced baseline functional capacity. I do think a lot of his symptoms are indeed emanating from his obstructive CAD and significant peri-infarct ischemia in LAD territory. I reckon he may be a good candidate for robotic LIMA-LAD bypass surgery since other vessels do not have significant disease barring a small nondominant RCA. I recommend repeat coronary angiography for further decision-making. I reckon with successful revascularization, he may be able to come off a lot of his current antianginal medications.  Patient will to think about this and get back to me.  Mixed hyperlipidemia: Continue Lipitor 40 mg daily.  Hypertension:  Elevated today, but generally well-controlled on current antihypertensive therapy including amlodipine , lisinopril , along with antianginal therapy with atenolol .  Well-controlled.  I spent 30 minutes in the care of [name] today including detailed discussion of medical, percutaneous, and surgical options for management of  coronary artery disease, emphasizing importance of repeat coronary angiography with possibility of a robotic LIMA-LAD bypass surgery, and documenting in the encounter.   F/u in 3 months  Signed, [name], MD

## 2024-05-06 NOTE — Telephone Encounter (Signed)
 Spoke to  patient . He states he passed out twice yesterday.  The first time was  witness and the second time was witness by his wife.  He states he took dulcolax-    he did drink water,  he did eatas well. 1)  patient states @ 4:30 pm - working at his art bench - abdomen cramping,dizziness, dizziness - passed out went to the floor. -- he states he felt a little better-- he drove himself home-   Patient states he went home - sitting  in chair  -- he went to  bathroom -- urinated once doing that he states he felt dizzy called for his wife to tell her that he thought he would pas out , in which he did.   No chest pain,  no shortness of breath  , no nausea with either episodes,  did have control of bowel and bladder. He did not injury himself.   Blood pressure range  135/75  heart rate 62.Patient states this  somewhat elevated for him,  Patient did no contact  primary or go to the ER.  He states he will being having doppler testing today as well a pre-op  assessment today for upcoming  CABG surgery 05/08/24.   RN informed patient will send message to Dr Elmira for further instruction .  RN  instructed patient to contact his primary office , as well Dr Shyrl 's office.   Suggest he has his wife drive him to his appt's for today. He verbalized understanding.

## 2024-05-06 NOTE — Progress Notes (Signed)
 PCP - Lucie Hurl, MD Cardiologist - Dr. Newman Lawrence - last office visit 02/15/2024  PPM/ICD - Denies Device Orders - n/a Rep Notified - n/a  Chest x-ray - 05/06/2024 EKG - 05/06/2024 Stress Test - 11/06/2023 ECHO - 04/15/2024 Cardiac Cath - 02/28/2024  Sleep Study - Denies CPAP - n/a  No DM  Last dose of GLP1 agonist- n/a GLP1 instructions: n/a  Blood Thinner Instructions: n/a Aspirin  Instructions: Pt instructed to continue taking ASA through the day before surgery and none the morning of surgery  NPO after midnight  COVID TEST- n/a   Anesthesia review: Yes. Pt states he had two separate episodes of dizziness/syncope with fall yesterday, August 18th. He endorses some lower back pain as a result. He said the only thing he did differently was take a dulcolax for constipation that day (which he normally has no issues with BMs). He has an appointment with cardiology today at 1pm and will be evaluated then. Discussed with Isaiah Ruder, PA who will follow-up after appointment.    Patient denies shortness of breath, fever, cough and chest pain at PAT appointment. Pt denies any respiratory illness/infection in the last two months.   All instructions explained to the patient, with a verbal understanding of the material. Patient agrees to go over the instructions while at home for a better understanding. Patient also instructed to self quarantine after being tested for COVID-19. The opportunity to ask questions was provided.

## 2024-05-06 NOTE — Telephone Encounter (Signed)
 Per Dr Elmira - appointment for today at 1 pm. Patient is aware.

## 2024-05-06 NOTE — Patient Instructions (Signed)
   Follow-Up: At [facility], you and your health needs are our priority.  As part of our continuing mission to provide you with exceptional heart care, our providers are all part of one team.  This team includes your primary Cardiologist (physician) and Advanced Practice Providers or APPs (Physician Assistants and Nurse Practitioners) who all work together to provide you with the care you need, when you need it.  Your next appointment:   Move appointment to 06/2024  Provider:   [name], MD

## 2024-05-06 NOTE — Telephone Encounter (Signed)
 I can see him today for urgent visit.  Thanks MJP

## 2024-05-06 NOTE — Telephone Encounter (Signed)
 Pt c/o Syncope: STAT if syncope occurred within 24 hours and pt complains of lightheadedness  Did you pass out today? No    When is the last time you passed out? Yesterday (pt states this occurred after he took a laxative)   Has this occurred multiple times? Twice yesterday    Did you have any symptoms prior to passing out? Felt lightheaded   5. Did you fall? Yes    If so, are you on a blood thinner?  No

## 2024-05-07 ENCOUNTER — Encounter (HOSPITAL_COMMUNITY): Payer: Self-pay

## 2024-05-07 ENCOUNTER — Encounter: Payer: Self-pay | Admitting: Cardiology

## 2024-05-07 MED ORDER — DEXMEDETOMIDINE HCL IN NACL 400 MCG/100ML IV SOLN
0.1000 ug/kg/h | INTRAVENOUS | Status: DC
  Filled 2024-05-07: qty 100

## 2024-05-07 MED ORDER — TRANEXAMIC ACID (OHS) BOLUS VIA INFUSION
15.0000 mg/kg | INTRAVENOUS | Status: AC
  Administered 2024-05-08: 1152 mg via INTRAVENOUS
  Filled 2024-05-07: qty 1152

## 2024-05-07 MED ORDER — MANNITOL 20 % IV SOLN
INTRAVENOUS | Status: DC
  Filled 2024-05-07: qty 13

## 2024-05-07 MED ORDER — PHENYLEPHRINE HCL-NACL 20-0.9 MG/250ML-% IV SOLN
30.0000 ug/min | INTRAVENOUS | Status: DC
  Filled 2024-05-07: qty 250

## 2024-05-07 MED ORDER — EPINEPHRINE HCL 5 MG/250ML IV SOLN IN NS
0.0000 ug/min | INTRAVENOUS | Status: DC
  Filled 2024-05-07: qty 250

## 2024-05-07 MED ORDER — TRANEXAMIC ACID 1000 MG/10ML IV SOLN
1.5000 mg/kg/h | INTRAVENOUS | Status: AC
  Administered 2024-05-08: 1.5 mg/kg/h via INTRAVENOUS
  Filled 2024-05-07: qty 25

## 2024-05-07 MED ORDER — VANCOMYCIN HCL 1250 MG/250ML IV SOLN
1250.0000 mg | INTRAVENOUS | Status: AC
  Administered 2024-05-08: 1250 mg via INTRAVENOUS
  Filled 2024-05-07: qty 250

## 2024-05-07 MED ORDER — INSULIN REGULAR(HUMAN) IN NACL 100-0.9 UT/100ML-% IV SOLN
INTRAVENOUS | Status: DC
  Filled 2024-05-07: qty 100

## 2024-05-07 MED ORDER — CEFAZOLIN SODIUM-DEXTROSE 2-4 GM/100ML-% IV SOLN
2.0000 g | INTRAVENOUS | Status: AC
  Administered 2024-05-08: 2 g via INTRAVENOUS
  Filled 2024-05-07: qty 100

## 2024-05-07 MED ORDER — PLASMA-LYTE A IV SOLN
INTRAVENOUS | Status: DC
  Filled 2024-05-07: qty 2.5

## 2024-05-07 MED ORDER — TRANEXAMIC ACID (OHS) PUMP PRIME SOLUTION
2.0000 mg/kg | INTRAVENOUS | Status: DC
  Filled 2024-05-07: qty 1.54

## 2024-05-07 MED ORDER — CEFAZOLIN SODIUM-DEXTROSE 2-4 GM/100ML-% IV SOLN
2.0000 g | INTRAVENOUS | Status: DC
  Filled 2024-05-07: qty 100

## 2024-05-07 MED ORDER — NOREPINEPHRINE 4 MG/250ML-% IV SOLN
0.0000 ug/min | INTRAVENOUS | Status: AC
  Administered 2024-05-08: 2 ug/kg/min via INTRAVENOUS
  Filled 2024-05-07: qty 250

## 2024-05-07 MED ORDER — NITROGLYCERIN IN D5W 200-5 MCG/ML-% IV SOLN
2.0000 ug/min | INTRAVENOUS | Status: DC
  Filled 2024-05-07: qty 250

## 2024-05-07 MED ORDER — HEPARIN 30,000 UNITS/1000 ML (OHS) CELLSAVER SOLUTION
Status: DC
  Filled 2024-05-07: qty 1000

## 2024-05-07 MED ORDER — MILRINONE LACTATE IN DEXTROSE 20-5 MG/100ML-% IV SOLN
0.3000 ug/kg/min | INTRAVENOUS | Status: DC
  Filled 2024-05-07: qty 100

## 2024-05-07 MED ORDER — POTASSIUM CHLORIDE 2 MEQ/ML IV SOLN
80.0000 meq | INTRAVENOUS | Status: DC
  Filled 2024-05-07: qty 40

## 2024-05-07 NOTE — Anesthesia Preprocedure Evaluation (Signed)
 Anesthesia Evaluation  Patient identified by MRN, date of birth, ID band Patient awake and Patient confused    Reviewed: Allergy & Precautions, NPO status , Patient's Chart, lab work & pertinent test results, Unable to perform ROS - Chart review only  History of Anesthesia Complications Negative for: history of anesthetic complications  Airway Mallampati: II  TM Distance: >3 FB Neck ROM: Limited   Comment: Ankylosed neck Dental  (+) Dental Advisory Given   Pulmonary former smoker   breath sounds clear to auscultation       Cardiovascular hypertension, Pt. on medications + DVT   Rhythm:Regular Rate:Normal  '21 ECHO: EF 55 to 60%.  1. The LV has normal function. There is moderately increased LVH.   2. Moderate asymmetric hypertrophy measuring 16mm in basal septum (9mm in  posterior wall)   3. Global RV has normal systolic function.The right ventricular size is mildly enlarged.   4. The mitral valve is normal in structure. No evidence of mitral valve regurgitation.   5. The aortic valve is tricuspid. Aortic valve regurgitation is trivial. Mild aortic valve sclerosis without stenosis.   6. The tricuspid valve is normal in structure.   7. The pulmonic valve was not well visualized. Pulmonic valve regurgitation is trivial.     Neuro/Psych   Anxiety Depression   Dementia    GI/Hepatic Neg liver ROS,GERD  Medicated,,  Endo/Other  negative endocrine ROS    Renal/GU negative Renal ROS     Musculoskeletal   Abdominal   Peds  Hematology Xarelto : last 05/05/2024   Anesthesia Other Findings   Reproductive/Obstetrics                              Anesthesia Physical Anesthesia Plan  ASA: 3  Anesthesia Plan: General   Post-op Pain Management: Regional block* and Ofirmev  IV (intra-op)*   Induction: Intravenous  PONV Risk Score and Plan: 2 and Ondansetron  and Dexamethasone   Airway Management  Planned: Oral ETT and Video Laryngoscope Planned  Additional Equipment: None  Intra-op Plan:   Post-operative Plan: Extubation in OR  Informed Consent: I have reviewed the patients History and Physical, chart, labs and discussed the procedure including the risks, benefits and alternatives for the proposed anesthesia with the patient or authorized representative who has indicated his/her understanding and acceptance.     Consent reviewed with POA, Dental advisory given and History available from chart only  Plan Discussed with: CRNA and Surgeon  Anesthesia Plan Comments: (PAT note written 05/07/2024 by Vrishank Moster, PA-C. Discussed with patient's wife, [place], by telephone )         Anesthesia Quick Evaluation

## 2024-05-07 NOTE — Progress Notes (Signed)
 Anesthesia Chart Review:  Case: [id number] Date/Time: 05/08/24 0715   Procedures:      OFF PUMP CORONARY ARTERY BYPASS GRAFTING (CABG) (Chest)     ECHOCARDIOGRAM, TRANSESOPHAGEAL   Anesthesia type: General   Diagnosis: Coronary artery disease involving native coronary artery of native heart, unspecified whether angina present [I25.10]   Pre-op diagnosis: CAD   [facility] / [facility]   Surgeons: Shyrl Linnie KIDD, MD       DISCUSSION: Patient is a 73 year old male scheduled for the above procedure. Unsuccessful PCI to LAD in 2022, complicated by dissection/NSTEMI. More recently he developed exertional chest pain and dyspnea despite optimal medical therapy. He was having dizziness and fatigue on b-blocker therapy. Above procedure planned.   History includes former smoker, HTN, HLD, CAD (unsuccessful mLAD PCI with dissection & partial vessel closure, periprocedure NSTEMI/VF s/p defib 07/26/21), TIA (2010), alcohol abuse (was drinking 6 pack/day; sober for a few months).  Urgent visit with cardiologist Dr. Elmira on 05/06/2024 due to syncopal episodes x2 on 05/05/2024.  He had Dulcolax with subsequent abdominal cramping and dizziness prior to first event. Note is still pending. EKG on 05/06/2024 showed NSR. Labs showed mild hyponatremia (Na 130), glucose 111, A1c 5.3%, H/H 16.0/45.0, Cr 0.83, K 3.9.  Anesthesia team to evaluate on the day of surgery.    VS: BP (!) 156/83   Pulse 100   Temp 36.7 C   Resp 18   Ht 5' 9 (1.753 m)   Wt 75.8 kg   SpO2 100%   BMI 24.66 kg/m   PROVIDERS: Leila Lucie LABOR, MD is PCP  Elmira Penman, MD is cardiologist  Stacia Hamilton, MD is GI Gregg Lek, MD is neurologist. Seen once on 02/23/2023 for positional dizziness. MRI brain discussed with anticipated as needed follow-up. MRI was not ever scheduled.    LABS: Labs reviewed: Acceptable for surgery. (all labs ordered are listed, but only abnormal results are displayed)  Labs  Reviewed  CBC - Abnormal; Notable for the following components:      Result Value   MCH 35.1 (*)    All other components within normal limits  COMPREHENSIVE METABOLIC PANEL WITH GFR - Abnormal; Notable for the following components:   Sodium 130 (*)    Chloride 96 (*)    Glucose, Bld 111 (*)    BUN 7 (*)    All other components within normal limits  SURGICAL PCR SCREEN  PROTIME-INR  APTT  URINALYSIS, ROUTINE W REFLEX MICROSCOPIC  HEMOGLOBIN A1C  TYPE AND SCREEN     IMAGES: CXR 05/06/2024: FINDINGS: The cardiomediastinal contours are normal. Aortic atherosclerosis. Pulmonary vasculature is normal. No consolidation, pleural effusion, or pneumothorax. No acute osseous abnormalities are seen. IMPRESSION: No active cardiopulmonary disease.   EKG: 05/06/2024: NSR   CV: US  Carotid 05/06/2024: Summary:  - Right Carotid: Velocities in the right ICA are consistent with a 60-79% stenosis. Velocities suggest upper end of range.  - Left Carotid: Velocities in the left ICA are consistent with a 1-39% stenosis.  - Vertebrals: Bilateral vertebral arteries demonstrate antegrade flow.  - Subclavians: Normal flow hemodynamics were seen in bilateral subclavian arteries.    Echo 04/15/2024: IMPRESSIONS   1. Left ventricular ejection fraction, by estimation, is 55 to 60%. The  left ventricle has normal function. The left ventricle has no regional  wall motion abnormalities. Left ventricular diastolic parameters are  indeterminate.   2. Right ventricular systolic function is normal. The right ventricular  size is normal.  There is mildly elevated pulmonary artery systolic  pressure. The estimated  right ventricular systolic pressure is 41.2 mmHg.   3. The mitral valve is degenerative. Mild mitral valve regurgitation. No  evidence of mitral stenosis.   4. The aortic valve is calcified. Aortic valve regurgitation is not  visualized. Aortic valve sclerosis/calcification is present, without any   evidence of aortic stenosis.   5. Aortic dilatation noted. There is mild dilatation of the ascending  aorta, measuring 38 mm.   6. The inferior vena cava is normal in size with greater than 50%  respiratory variability, suggesting right atrial pressure of 3 mmHg.     Coronary angiography 02/28/2024: LM: Normal LAD: Severely calcified and tortuous vessel          Proximal diffuse 70% disease          Mid 99% stenosis (likely healed dissection from previously unsuccessful PCI attempt)          Distal vessel gets antegrade flow through a small channel Lcx: Large dominant vessel, no significant disease RCA: Small nondominant vessel, proximal 80% stenosis   LVEDP 26 mmHg   Functionally, single-vessel obstructive disease of LAD (severe stenosis in proximal RCA, but it is a small nondominant vessel) Anterolateral infarct, but moderate peri-infarct ischemia Patient continues to have angina symptoms on optimal medical therapy I do think he would benefit from revascularization to LAD.  Best case scenario would be a robotic assisted LIMA-LAD graft.  The target for LIMA-LAD graft may well be quite distal.  I would refer him to Dr. Shyrl for consideration for this.  If he is not deemed to be candidate for robotic assisted LIMA-LAD graft, then revascularization option would include sternotomy LIMA-LAD graft versus repeat attempt of a very complex intervention.    Past Medical History:  Diagnosis Date   Anginal pain (HCC)    Anxiety    Arthritis    CAD (coronary artery disease)    2022 angioplasty - went into Vfib on the table   GERD (gastroesophageal reflux disease)    Hyperlipidemia    Hypertension    Myocardial infarction (HCC)    Stroke (HCC)    TIA 2010 - No deficits   Substance abuse (HCC)    Hx of ETOH abuse. No alcohol in a few months as of 2025   TIA (transient ischemic attack)     Past Surgical History:  Procedure Laterality Date   COLONOSCOPY     CORONARY BALLOON  ANGIOPLASTY N/A 07/26/2021   Procedure: CORONARY BALLOON ANGIOPLASTY;  Surgeon: Elmira Newman PARAS, MD;  [facility]: [facility];  Service: Cardiovascular;  Laterality: N/A;   LEFT HEART CATH AND CORONARY ANGIOGRAPHY N/A 07/26/2021   Procedure: LEFT HEART CATH AND CORONARY ANGIOGRAPHY;  Surgeon: Elmira Newman PARAS, MD;  Location: [facility];  Service: Cardiovascular;  Laterality: N/A;   LEFT HEART CATH AND CORONARY ANGIOGRAPHY N/A 02/28/2024   Procedure: LEFT HEART CATH AND CORONARY ANGIOGRAPHY;  Surgeon: Elmira Newman PARAS, MD;  Location: [facility];  Service: Cardiovascular;  Laterality: N/A;    MEDICATIONS:  amLODipine  (NORVASC ) 5 MG tablet   aspirin  81 MG EC tablet   atenolol  (TENORMIN ) 25 MG tablet   atorvastatin  (LIPITOR) 40 MG tablet   hydrALAZINE  (APRESOLINE ) 50 MG tablet   lisinopril  (ZESTRIL ) 20 MG tablet   nitroGLYCERIN  (NITROSTAT ) 0.4 MG SL tablet   pantoprazole  (PROTONIX ) 40 MG tablet   ranolazine  (RANEXA ) 1000 MG SR tablet   vitamin B-12 (CYANOCOBALAMIN) 1000  MCG tablet   No current facility-administered medications for this encounter.    [START ON 05/08/2024] ceFAZolin  (ANCEF ) IVPB 2g/100 mL premix   [START ON 05/08/2024] ceFAZolin  (ANCEF ) IVPB 2g/100 mL premix   [START ON 05/08/2024] dexmedetomidine  (PRECEDEX ) 400 MCG/100ML (4 mcg/mL) infusion   [START ON 05/08/2024] EPINEPHrine  (ADRENALIN ) 5 mg in NS 250 mL (0.02 mg/mL) premix infusion   [START ON 05/08/2024] heparin  30,000 units/NS 1000 mL solution for CELLSAVER   [START ON 05/08/2024] heparin  sodium (porcine) 2,500 Units, papaverine  30 mg in electrolyte-A (PLASMALYTE-A PH 7.4) 500 mL irrigation   [START ON 05/08/2024] insulin  regular, human (MYXREDLIN ) 100 units/ 100 mL infusion   [START ON 05/08/2024] [facility] Blood Cardioplegia vial (lidocaine /magnesium /mannitol  0.26g-4g-6.4g)   [START ON 05/08/2024] milrinone  (PRIMACOR ) 20 MG/100 ML (0.2 mg/mL) infusion   [START ON 05/08/2024] nitroGLYCERIN  50  mg in dextrose  5 % 250 mL (0.2 mg/mL) infusion   [START ON 05/08/2024] norepinephrine  (LEVOPHED ) 4mg  in (0.016 mg/mL) premix infusion   [START ON 05/08/2024] phenylephrine  (NEO-SYNEPHRINE) 20mg /NS 250mL premix infusion   [START ON 05/08/2024] potassium chloride  injection 80 mEq   [START ON 05/08/2024] tranexamic acid  (CYKLOKAPRON ) 2,500 mg in sodium chloride  0.9 % 250 mL (10 mg/mL) infusion   [START ON 05/08/2024] tranexamic acid  (CYKLOKAPRON ) bolus via infusion - over 30 minutes 1,152 mg   [START ON 05/08/2024] tranexamic acid  (CYKLOKAPRON ) pump prime solution 154 mg   [START ON 05/08/2024] vancomycin  (VANCOREADY) IVPB 1250 mg/250 mL    Selim Durden, PA-C Surgical Short Stay/Anesthesiology [facility] Phone [phone] [facility] Phone [phone] 05/07/2024 10:19 AM

## 2024-05-08 ENCOUNTER — Inpatient Hospital Stay (HOSPITAL_COMMUNITY)

## 2024-05-08 ENCOUNTER — Inpatient Hospital Stay (HOSPITAL_COMMUNITY)
Admission: RE | Disposition: A | Discharge status: Home Health | Payer: Self-pay | Source: Home / Self Care | Attending: Thoracic Surgery (Cardiothoracic Vascular Surgery)

## 2024-05-08 ENCOUNTER — Other Ambulatory Visit: Payer: Self-pay

## 2024-05-08 ENCOUNTER — Inpatient Hospital Stay (HOSPITAL_COMMUNITY)
Admission: RE | Admit: 2024-05-08 | Discharge: 2024-05-19 | DRG: 236 | Disposition: A | Discharge status: Home Health | Attending: Thoracic Surgery (Cardiothoracic Vascular Surgery) | Admitting: Thoracic Surgery (Cardiothoracic Vascular Surgery)

## 2024-05-08 ENCOUNTER — Inpatient Hospital Stay (HOSPITAL_COMMUNITY): Admitting: Certified Registered Nurse Anesthetist

## 2024-05-08 ENCOUNTER — Inpatient Hospital Stay (HOSPITAL_COMMUNITY): Payer: Self-pay | Admitting: Vascular Surgery

## 2024-05-08 ENCOUNTER — Encounter (HOSPITAL_COMMUNITY): Payer: Self-pay | Admitting: Thoracic Surgery (Cardiothoracic Vascular Surgery)

## 2024-05-08 DIAGNOSIS — R112 Nausea with vomiting, unspecified: Secondary | ICD-10-CM | POA: Diagnosis not present

## 2024-05-08 DIAGNOSIS — I9789 Other postprocedural complications and disorders of the circulatory system, not elsewhere classified: Secondary | ICD-10-CM | POA: Diagnosis not present

## 2024-05-08 DIAGNOSIS — I10 Essential (primary) hypertension: Secondary | ICD-10-CM | POA: Diagnosis present

## 2024-05-08 DIAGNOSIS — R55 Syncope and collapse: Secondary | ICD-10-CM | POA: Diagnosis present

## 2024-05-08 DIAGNOSIS — M1990 Unspecified osteoarthritis, unspecified site: Secondary | ICD-10-CM | POA: Diagnosis present

## 2024-05-08 DIAGNOSIS — J9811 Atelectasis: Secondary | ICD-10-CM | POA: Diagnosis not present

## 2024-05-08 DIAGNOSIS — I25118 Atherosclerotic heart disease of native coronary artery with other forms of angina pectoris: Principal | ICD-10-CM | POA: Diagnosis present

## 2024-05-08 DIAGNOSIS — I2510 Atherosclerotic heart disease of native coronary artery without angina pectoris: Secondary | ICD-10-CM | POA: Diagnosis present

## 2024-05-08 DIAGNOSIS — Z87891 Personal history of nicotine dependence: Secondary | ICD-10-CM

## 2024-05-08 DIAGNOSIS — E782 Mixed hyperlipidemia: Secondary | ICD-10-CM | POA: Diagnosis not present

## 2024-05-08 DIAGNOSIS — E876 Hypokalemia: Secondary | ICD-10-CM | POA: Diagnosis present

## 2024-05-08 DIAGNOSIS — Z9911 Dependence on respirator [ventilator] status: Secondary | ICD-10-CM | POA: Diagnosis not present

## 2024-05-08 DIAGNOSIS — I9581 Postprocedural hypotension: Secondary | ICD-10-CM | POA: Diagnosis not present

## 2024-05-08 DIAGNOSIS — K219 Gastro-esophageal reflux disease without esophagitis: Secondary | ICD-10-CM | POA: Diagnosis present

## 2024-05-08 DIAGNOSIS — R531 Weakness: Secondary | ICD-10-CM | POA: Diagnosis present

## 2024-05-08 DIAGNOSIS — M545 Low back pain, unspecified: Secondary | ICD-10-CM | POA: Diagnosis present

## 2024-05-08 DIAGNOSIS — I4891 Unspecified atrial fibrillation: Secondary | ICD-10-CM | POA: Diagnosis not present

## 2024-05-08 DIAGNOSIS — Z9861 Coronary angioplasty status: Secondary | ICD-10-CM | POA: Diagnosis not present

## 2024-05-08 DIAGNOSIS — Z951 Presence of aortocoronary bypass graft: Principal | ICD-10-CM

## 2024-05-08 DIAGNOSIS — I252 Old myocardial infarction: Secondary | ICD-10-CM

## 2024-05-08 DIAGNOSIS — I480 Paroxysmal atrial fibrillation: Secondary | ICD-10-CM | POA: Diagnosis not present

## 2024-05-08 DIAGNOSIS — E871 Hypo-osmolality and hyponatremia: Secondary | ICD-10-CM | POA: Diagnosis present

## 2024-05-08 DIAGNOSIS — Z79899 Other long term (current) drug therapy: Secondary | ICD-10-CM

## 2024-05-08 DIAGNOSIS — Z8674 Personal history of sudden cardiac arrest: Secondary | ICD-10-CM

## 2024-05-08 DIAGNOSIS — L03114 Cellulitis of left upper limb: Secondary | ICD-10-CM | POA: Diagnosis not present

## 2024-05-08 DIAGNOSIS — R001 Bradycardia, unspecified: Secondary | ICD-10-CM | POA: Diagnosis not present

## 2024-05-08 DIAGNOSIS — J9600 Acute respiratory failure, unspecified whether with hypoxia or hypercapnia: Secondary | ICD-10-CM | POA: Diagnosis not present

## 2024-05-08 DIAGNOSIS — Z8249 Family history of ischemic heart disease and other diseases of the circulatory system: Secondary | ICD-10-CM

## 2024-05-08 DIAGNOSIS — Z7901 Long term (current) use of anticoagulants: Secondary | ICD-10-CM

## 2024-05-08 DIAGNOSIS — J95811 Postprocedural pneumothorax: Secondary | ICD-10-CM | POA: Diagnosis not present

## 2024-05-08 DIAGNOSIS — M542 Cervicalgia: Secondary | ICD-10-CM | POA: Diagnosis present

## 2024-05-08 DIAGNOSIS — J939 Pneumothorax, unspecified: Secondary | ICD-10-CM | POA: Diagnosis not present

## 2024-05-08 DIAGNOSIS — Z7982 Long term (current) use of aspirin: Secondary | ICD-10-CM

## 2024-05-08 DIAGNOSIS — L03113 Cellulitis of right upper limb: Secondary | ICD-10-CM | POA: Diagnosis not present

## 2024-05-08 DIAGNOSIS — F419 Anxiety disorder, unspecified: Secondary | ICD-10-CM | POA: Diagnosis present

## 2024-05-08 DIAGNOSIS — R7303 Prediabetes: Secondary | ICD-10-CM | POA: Diagnosis present

## 2024-05-08 DIAGNOSIS — I808 Phlebitis and thrombophlebitis of other sites: Secondary | ICD-10-CM | POA: Diagnosis not present

## 2024-05-08 DIAGNOSIS — D62 Acute posthemorrhagic anemia: Secondary | ICD-10-CM | POA: Diagnosis not present

## 2024-05-08 DIAGNOSIS — Z888 Allergy status to other drugs, medicaments and biological substances status: Secondary | ICD-10-CM

## 2024-05-08 DIAGNOSIS — R0689 Other abnormalities of breathing: Secondary | ICD-10-CM | POA: Diagnosis not present

## 2024-05-08 DIAGNOSIS — Z8673 Personal history of transient ischemic attack (TIA), and cerebral infarction without residual deficits: Secondary | ICD-10-CM

## 2024-05-08 DIAGNOSIS — D75839 Thrombocytosis, unspecified: Secondary | ICD-10-CM | POA: Diagnosis not present

## 2024-05-08 DIAGNOSIS — T8089XA Other complications following infusion, transfusion and therapeutic injection, initial encounter: Secondary | ICD-10-CM | POA: Diagnosis not present

## 2024-05-08 DIAGNOSIS — I959 Hypotension, unspecified: Secondary | ICD-10-CM | POA: Diagnosis not present

## 2024-05-08 DIAGNOSIS — E785 Hyperlipidemia, unspecified: Secondary | ICD-10-CM | POA: Diagnosis not present

## 2024-05-08 DIAGNOSIS — J90 Pleural effusion, not elsewhere classified: Secondary | ICD-10-CM | POA: Diagnosis not present

## 2024-05-08 HISTORY — PX: CORONARY ARTERY BYPASS GRAFT: SHX141

## 2024-05-08 HISTORY — PX: TEE WITHOUT CARDIOVERSION: SHX5443

## 2024-05-08 LAB — CBC WITH DIFFERENTIAL/PLATELET
Abs Immature Granulocytes: 0.08 K/uL — ABNORMAL HIGH (ref 0.00–0.07)
Basophils Absolute: 0 K/uL (ref 0.0–0.1)
Basophils Relative: 0 %
Eosinophils Absolute: 0 K/uL (ref 0.0–0.5)
Eosinophils Relative: 0 %
HCT: 29.3 % — ABNORMAL LOW (ref 39.0–52.0)
Hemoglobin: 10.3 g/dL — ABNORMAL LOW (ref 13.0–17.0)
Immature Granulocytes: 1 %
Lymphocytes Relative: 3 %
Lymphs Abs: 0.6 K/uL — ABNORMAL LOW (ref 0.7–4.0)
MCH: 34.8 pg — ABNORMAL HIGH (ref 26.0–34.0)
MCHC: 35.2 g/dL (ref 30.0–36.0)
MCV: 99 fL (ref 80.0–100.0)
Monocytes Absolute: 1.4 K/uL — ABNORMAL HIGH (ref 0.1–1.0)
Monocytes Relative: 8 %
Neutro Abs: 14.9 K/uL — ABNORMAL HIGH (ref 1.7–7.7)
Neutrophils Relative %: 88 %
Platelets: 249 K/uL (ref 150–400)
RBC: 2.96 MIL/uL — ABNORMAL LOW (ref 4.22–5.81)
RDW: 11.5 % (ref 11.5–15.5)
WBC: 17 K/uL — ABNORMAL HIGH (ref 4.0–10.5)
nRBC: 0 % (ref 0.0–0.2)

## 2024-05-08 LAB — POCT I-STAT, CHEM 8
BUN: 3 mg/dL — ABNORMAL LOW (ref 8–23)
BUN: 4 mg/dL — ABNORMAL LOW (ref 8–23)
BUN: 3 mg/dL — ABNORMAL LOW (ref 8–23)
Calcium, Ion: 1.12 mmol/L — ABNORMAL LOW (ref 1.15–1.40)
Calcium, Ion: 1.18 mmol/L (ref 1.15–1.40)
Calcium, Ion: 1.1 mmol/L — ABNORMAL LOW (ref 1.15–1.40)
Chloride: 98 mmol/L (ref 98–111)
Chloride: 97 mmol/L — ABNORMAL LOW (ref 98–111)
Chloride: 100 mmol/L (ref 98–111)
Creatinine, Ser: 0.4 mg/dL — ABNORMAL LOW (ref 0.61–1.24)
Creatinine, Ser: 0.4 mg/dL — ABNORMAL LOW (ref 0.61–1.24)
Creatinine, Ser: 0.4 mg/dL — ABNORMAL LOW (ref 0.61–1.24)
Glucose, Bld: 119 mg/dL — ABNORMAL HIGH (ref 70–99)
Glucose, Bld: 113 mg/dL — ABNORMAL HIGH (ref 70–99)
Glucose, Bld: 120 mg/dL — ABNORMAL HIGH (ref 70–99)
HCT: 32 % — ABNORMAL LOW (ref 39.0–52.0)
HCT: 35 % — ABNORMAL LOW (ref 39.0–52.0)
HCT: 33 % — ABNORMAL LOW (ref 39.0–52.0)
Hemoglobin: 10.9 g/dL — ABNORMAL LOW (ref 13.0–17.0)
Hemoglobin: 11.9 g/dL — ABNORMAL LOW (ref 13.0–17.0)
Hemoglobin: 11.2 g/dL — ABNORMAL LOW (ref 13.0–17.0)
Potassium: 3.3 mmol/L — ABNORMAL LOW (ref 3.5–5.1)
Potassium: 3.9 mmol/L (ref 3.5–5.1)
Potassium: 3.4 mmol/L — ABNORMAL LOW (ref 3.5–5.1)
Sodium: 133 mmol/L — ABNORMAL LOW (ref 135–145)
Sodium: 133 mmol/L — ABNORMAL LOW (ref 135–145)
Sodium: 136 mmol/L (ref 135–145)
TCO2: 25 mmol/L (ref 22–32)
TCO2: 24 mmol/L (ref 22–32)
TCO2: 22 mmol/L (ref 22–32)

## 2024-05-08 LAB — BASIC METABOLIC PANEL WITH GFR
Anion gap: 10 (ref 5–15)
BUN: 6 mg/dL — ABNORMAL LOW (ref 8–23)
CO2: 22 mmol/L (ref 22–32)
Calcium: 7.5 mg/dL — ABNORMAL LOW (ref 8.9–10.3)
Chloride: 101 mmol/L (ref 98–111)
Creatinine, Ser: 0.65 mg/dL (ref 0.61–1.24)
GFR, Estimated: >60 mL/min (ref >60)
Glucose, Bld: 135 mg/dL — ABNORMAL HIGH (ref 70–99)
Potassium: 4 mmol/L (ref 3.5–5.1)
Sodium: 133 mmol/L — ABNORMAL LOW (ref 135–145)

## 2024-05-08 LAB — PROTIME-INR
INR: 1.3 — ABNORMAL HIGH (ref 0.8–1.2)
Prothrombin Time: 17 s — ABNORMAL HIGH (ref 11.4–15.2)

## 2024-05-08 LAB — CBC
HCT: 34 % — ABNORMAL LOW (ref 39.0–52.0)
Hemoglobin: 12.3 g/dL — ABNORMAL LOW (ref 13.0–17.0)
MCH: 35.2 pg — ABNORMAL HIGH (ref 26.0–34.0)
MCHC: 36.2 g/dL — ABNORMAL HIGH (ref 30.0–36.0)
MCV: 97.4 fL (ref 80.0–100.0)
Platelets: 235 K/uL (ref 150–400)
RBC: 3.49 MIL/uL — ABNORMAL LOW (ref 4.22–5.81)
RDW: 11.5 % (ref 11.5–15.5)
WBC: 10.7 K/uL — ABNORMAL HIGH (ref 4.0–10.5)
nRBC: 0 % (ref 0.0–0.2)

## 2024-05-08 LAB — POCT I-STAT 7, (LYTES, BLD GAS, ICA,H+H)
Acid-base deficit: 4 mmol/L — ABNORMAL HIGH (ref 0.0–2.0)
Bicarbonate: 20.6 mmol/L (ref 20.0–28.0)
Calcium, Ion: 1.09 mmol/L — ABNORMAL LOW (ref 1.15–1.40)
HCT: 33 % — ABNORMAL LOW (ref 39.0–52.0)
Hemoglobin: 11.2 g/dL — ABNORMAL LOW (ref 13.0–17.0)
O2 Saturation: 100 %
Potassium: 3.3 mmol/L — ABNORMAL LOW (ref 3.5–5.1)
Sodium: 133 mmol/L — ABNORMAL LOW (ref 135–145)
TCO2: 22 mmol/L (ref 22–32)
pCO2 arterial: 36.5 mmHg (ref 32–48)
pH, Arterial: 7.36 (ref 7.35–7.45)
pO2, Arterial: 517 mmHg — ABNORMAL HIGH (ref 83–108)

## 2024-05-08 LAB — GLUCOSE, CAPILLARY
Glucose-Capillary: 110 mg/dL — ABNORMAL HIGH (ref 70–99)
Glucose-Capillary: 115 mg/dL — ABNORMAL HIGH (ref 70–99)
Glucose-Capillary: 132 mg/dL — ABNORMAL HIGH (ref 70–99)
Glucose-Capillary: 132 mg/dL — ABNORMAL HIGH (ref 70–99)
Glucose-Capillary: 137 mg/dL — ABNORMAL HIGH (ref 70–99)
Glucose-Capillary: 147 mg/dL — ABNORMAL HIGH (ref 70–99)
Glucose-Capillary: 81 mg/dL (ref 70–99)

## 2024-05-08 LAB — MAGNESIUM: Magnesium: 2.6 mg/dL — ABNORMAL HIGH (ref 1.7–2.4)

## 2024-05-08 LAB — ABO/RH: ABO/RH(D): AB NEG

## 2024-05-08 LAB — FIBRINOGEN: Fibrinogen: 258 mg/dL (ref 210–475)

## 2024-05-08 LAB — APTT: aPTT: 32 s (ref 24–36)

## 2024-05-08 SURGERY — OFF PUMP CORONARY ARTERY BYPASS GRAFTING (CABG)
Anesthesia: General | Site: Chest

## 2024-05-08 MED ORDER — ALBUMIN HUMAN 5 % IV SOLN: INTRAVENOUS | Status: DC | PRN

## 2024-05-08 MED ORDER — FENTANYL CITRATE (PF) 100 MCG/2ML IJ SOLN
INTRAMUSCULAR | Status: AC
  Administered 2024-05-08: 100 ug via INTRAVENOUS
  Filled 2024-05-08: qty 2

## 2024-05-08 MED ORDER — SODIUM CHLORIDE 0.9 % IV SOLN: 250.0000 mL | INTRAVENOUS | Status: DC

## 2024-05-08 MED ORDER — MIDAZOLAM HCL 2 MG/2ML IJ SOLN: 2.0000 mg | Freq: Once | INTRAMUSCULAR | Status: AC

## 2024-05-08 MED ORDER — CHLORHEXIDINE GLUCONATE CLOTH 2 % EX PADS
6.0000 | MEDICATED_PAD | Freq: Every day | CUTANEOUS | Status: DC
  Administered 2024-05-08 – 2024-05-19 (×11): 6 via TOPICAL

## 2024-05-08 MED ORDER — CHLORHEXIDINE GLUCONATE 0.12 % MT SOLN
15.0000 mL | OROMUCOSAL | Status: AC
  Administered 2024-05-08: 15 mL via OROMUCOSAL
  Filled 2024-05-08: qty 15

## 2024-05-08 MED ORDER — MIDAZOLAM HCL 2 MG/2ML IJ SOLN: 2.0000 mg | INTRAMUSCULAR | Status: DC | PRN

## 2024-05-08 MED ORDER — DOCUSATE SODIUM 100 MG PO CAPS
200.0000 mg | ORAL_CAPSULE | Freq: Every day | ORAL | Status: DC
  Administered 2024-05-09 – 2024-05-19 (×9): 200 mg via ORAL
  Filled 2024-05-08 (×9): qty 2

## 2024-05-08 MED ORDER — ALBUMIN HUMAN 5 % IV SOLN
250.0000 mL | INTRAVENOUS | Status: DC | PRN
  Administered 2024-05-08 (×2): 12.5 g via INTRAVENOUS

## 2024-05-08 MED ORDER — DEXTROSE 50 % IV SOLN: 0.0000 mL | INTRAVENOUS | Status: DC | PRN

## 2024-05-08 MED ORDER — METOPROLOL TARTRATE 25 MG/10 ML ORAL SUSPENSION: 12.5000 mg | Freq: Two times a day (BID) | ORAL | Status: DC

## 2024-05-08 MED ORDER — ACETAMINOPHEN 160 MG/5ML PO SOLN
650.0000 mg | Freq: Once | ORAL | Status: AC
  Administered 2024-05-08: 650 mg
  Filled 2024-05-08: qty 20.3

## 2024-05-08 MED ORDER — MIDAZOLAM HCL 5 MG/5ML IJ SOLN
INTRAMUSCULAR | Status: DC | PRN
  Administered 2024-05-08: 2 mg via INTRAVENOUS

## 2024-05-08 MED ORDER — ATORVASTATIN CALCIUM 80 MG PO TABS
80.0000 mg | ORAL_TABLET | Freq: Every day | ORAL | Status: DC
  Administered 2024-05-09 – 2024-05-19 (×11): 80 mg via ORAL
  Filled 2024-05-08 (×11): qty 1

## 2024-05-08 MED ORDER — CHLORHEXIDINE GLUCONATE 0.12 % MT SOLN
15.0000 mL | Freq: Once | OROMUCOSAL | Status: AC
  Administered 2024-05-08: 15 mL via OROMUCOSAL
  Filled 2024-05-08: qty 15

## 2024-05-08 MED ORDER — ACETAMINOPHEN 500 MG PO TABS
1000.0000 mg | ORAL_TABLET | Freq: Four times a day (QID) | ORAL | Status: DC
  Administered 2024-05-09 – 2024-05-12 (×15): 1000 mg via ORAL
  Filled 2024-05-08 (×19): qty 2

## 2024-05-08 MED ORDER — TRAMADOL HCL 50 MG PO TABS
50.0000 mg | ORAL_TABLET | ORAL | Status: DC | PRN
  Administered 2024-05-09 – 2024-05-15 (×2): 100 mg via ORAL
  Filled 2024-05-08 (×3): qty 2
  Filled 2024-05-08: qty 1
  Filled 2024-05-08: qty 2

## 2024-05-08 MED ORDER — ONDANSETRON HCL 4 MG/2ML IJ SOLN
4.0000 mg | Freq: Four times a day (QID) | INTRAMUSCULAR | Status: DC | PRN
  Administered 2024-05-09 – 2024-05-12 (×3): 4 mg via INTRAVENOUS
  Filled 2024-05-08 (×4): qty 2

## 2024-05-08 MED ORDER — HEPARIN SODIUM (PORCINE) 1000 UNIT/ML IJ SOLN
INTRAMUSCULAR | Status: DC | PRN
  Administered 2024-05-08: 12000 [IU] via INTRAVENOUS

## 2024-05-08 MED ORDER — MIDAZOLAM HCL (PF) 10 MG/2ML IJ SOLN
INTRAMUSCULAR | Status: AC
  Filled 2024-05-08: qty 2

## 2024-05-08 MED ORDER — CEFAZOLIN SODIUM-DEXTROSE 2-4 GM/100ML-% IV SOLN
2.0000 g | Freq: Three times a day (TID) | INTRAVENOUS | Status: AC
  Administered 2024-05-08 – 2024-05-10 (×6): 2 g via INTRAVENOUS
  Filled 2024-05-08 (×6): qty 100

## 2024-05-08 MED ORDER — BISACODYL 10 MG RE SUPP: 10.0000 mg | Freq: Every day | RECTAL | Status: DC

## 2024-05-08 MED ORDER — MAGNESIUM SULFATE 4 GM/100ML IV SOLN
4.0000 g | Freq: Once | INTRAVENOUS | Status: AC
  Administered 2024-05-08: 4 g via INTRAVENOUS
  Filled 2024-05-08: qty 100

## 2024-05-08 MED ORDER — SODIUM CHLORIDE 0.9% FLUSH
10.0000 mL | Freq: Two times a day (BID) | INTRAVENOUS | Status: DC
  Administered 2024-05-08 (×2): 10 mL

## 2024-05-08 MED ORDER — NOREPINEPHRINE 4 MG/250ML-% IV SOLN
0.0000 ug/min | INTRAVENOUS | Status: DC
  Administered 2024-05-08: 10 ug/min via INTRAVENOUS
  Administered 2024-05-09: 8 ug/min via INTRAVENOUS
  Filled 2024-05-08 (×2): qty 250

## 2024-05-08 MED ORDER — LACTATED RINGERS IV SOLN: INTRAVENOUS | Status: DC

## 2024-05-08 MED ORDER — EPHEDRINE SULFATE-NACL 50-0.9 MG/10ML-% IV SOSY
PREFILLED_SYRINGE | INTRAVENOUS | Status: DC | PRN
  Administered 2024-05-08: 2.5 mg via INTRAVENOUS
  Administered 2024-05-08: 5 mg via INTRAVENOUS
  Administered 2024-05-08: 2.5 mg via INTRAVENOUS

## 2024-05-08 MED ORDER — SODIUM CHLORIDE 0.9% FLUSH: 10.0000 mL | INTRAVENOUS | Status: DC | PRN

## 2024-05-08 MED ORDER — POTASSIUM CHLORIDE 10 MEQ/50ML IV SOLN
10.0000 meq | INTRAVENOUS | Status: AC
  Administered 2024-05-08 (×3): 10 meq via INTRAVENOUS

## 2024-05-08 MED ORDER — ROCURONIUM BROMIDE 10 MG/ML (PF) SYRINGE
PREFILLED_SYRINGE | INTRAVENOUS | Status: DC | PRN
  Administered 2024-05-08: 30 mg via INTRAVENOUS
  Administered 2024-05-08: 70 mg via INTRAVENOUS

## 2024-05-08 MED ORDER — PROTAMINE SULFATE 10 MG/ML IV SOLN
INTRAVENOUS | Status: DC | PRN
  Administered 2024-05-08: 110 mg via INTRAVENOUS
  Administered 2024-05-08: 10 mg via INTRAVENOUS

## 2024-05-08 MED ORDER — SODIUM CHLORIDE 0.9 % IV SOLN: INTRAVENOUS | Status: DC

## 2024-05-08 MED ORDER — INSULIN REGULAR(HUMAN) IN NACL 100-0.9 UT/100ML-% IV SOLN: INTRAVENOUS | Status: DC

## 2024-05-08 MED ORDER — ~~LOC~~ CARDIAC SURGERY, PATIENT & FAMILY EDUCATION
Freq: Once | Status: DC
  Filled 2024-05-08: qty 1

## 2024-05-08 MED ORDER — SODIUM CHLORIDE 0.45 % IV SOLN: INTRAVENOUS | Status: DC | PRN

## 2024-05-08 MED ORDER — PANTOPRAZOLE SODIUM 40 MG PO TBEC
40.0000 mg | DELAYED_RELEASE_TABLET | Freq: Every day | ORAL | Status: DC
  Administered 2024-05-10 – 2024-05-19 (×10): 40 mg via ORAL
  Filled 2024-05-08 (×10): qty 1

## 2024-05-08 MED ORDER — ACETAMINOPHEN 160 MG/5ML PO SOLN: 1000.0000 mg | Freq: Four times a day (QID) | ORAL | Status: DC

## 2024-05-08 MED ORDER — FENTANYL CITRATE (PF) 100 MCG/2ML IJ SOLN
INTRAMUSCULAR | Status: DC | PRN
  Administered 2024-05-08 (×2): 50 ug via INTRAVENOUS
  Administered 2024-05-08: 100 ug via INTRAVENOUS
  Administered 2024-05-08: 50 ug via INTRAVENOUS
  Administered 2024-05-08: 300 ug via INTRAVENOUS
  Administered 2024-05-08 (×2): 100 ug via INTRAVENOUS

## 2024-05-08 MED ORDER — SODIUM BICARBONATE 8.4 % IV SOLN
100.0000 meq | Freq: Once | INTRAVENOUS | Status: AC
  Administered 2024-05-08: 100 meq via INTRAVENOUS

## 2024-05-08 MED ORDER — INSULIN REGULAR(HUMAN) IN NACL 100-0.9 UT/100ML-% IV SOLN
INTRAVENOUS | Status: DC | PRN
  Administered 2024-05-08: 1 [IU]/h via INTRAVENOUS

## 2024-05-08 MED ORDER — METOPROLOL TARTRATE 12.5 MG HALF TABLET
12.5000 mg | ORAL_TABLET | Freq: Two times a day (BID) | ORAL | Status: DC
  Administered 2024-05-10 – 2024-05-15 (×12): 12.5 mg via ORAL
  Filled 2024-05-08 (×12): qty 1

## 2024-05-08 MED ORDER — SODIUM CHLORIDE 0.9% FLUSH: 3.0000 mL | INTRAVENOUS | Status: DC | PRN

## 2024-05-08 MED ORDER — FENTANYL CITRATE (PF) 100 MCG/2ML IJ SOLN: 100.0000 ug | Freq: Once | INTRAMUSCULAR | Status: AC

## 2024-05-08 MED ORDER — ASPIRIN 325 MG PO TBEC
325.0000 mg | DELAYED_RELEASE_TABLET | Freq: Every day | ORAL | Status: DC
  Administered 2024-05-09 – 2024-05-13 (×5): 325 mg via ORAL
  Filled 2024-05-08 (×5): qty 1

## 2024-05-08 MED ORDER — FENTANYL CITRATE (PF) 250 MCG/5ML IJ SOLN
INTRAMUSCULAR | Status: AC
  Filled 2024-05-08: qty 5

## 2024-05-08 MED ORDER — SODIUM CHLORIDE (PF) 0.9 % IJ SOLN: OROMUCOSAL | Status: DC | PRN

## 2024-05-08 MED ORDER — METOCLOPRAMIDE HCL 5 MG/ML IJ SOLN
10.0000 mg | Freq: Four times a day (QID) | INTRAMUSCULAR | Status: AC
  Administered 2024-05-08 – 2024-05-09 (×5): 10 mg via INTRAVENOUS
  Filled 2024-05-08 (×6): qty 2

## 2024-05-08 MED ORDER — VASOPRESSIN 20 UNIT/ML IV SOLN
INTRAVENOUS | Status: AC
  Filled 2024-05-08: qty 1

## 2024-05-08 MED ORDER — PANTOPRAZOLE SODIUM 40 MG IV SOLR
40.0000 mg | Freq: Every day | INTRAVENOUS | Status: AC
  Administered 2024-05-08 – 2024-05-09 (×2): 40 mg via INTRAVENOUS
  Filled 2024-05-08 (×2): qty 10

## 2024-05-08 MED ORDER — METOPROLOL TARTRATE 12.5 MG HALF TABLET: 12.5000 mg | ORAL_TABLET | Freq: Once | ORAL | Status: DC

## 2024-05-08 MED ORDER — PROPOFOL 10 MG/ML IV BOLUS
INTRAVENOUS | Status: DC | PRN
  Administered 2024-05-08: 100 mg via INTRAVENOUS

## 2024-05-08 MED ORDER — CHLORHEXIDINE GLUCONATE CLOTH 2 % EX PADS: 6.0000 | MEDICATED_PAD | Freq: Every day | CUTANEOUS | Status: DC

## 2024-05-08 MED ORDER — NITROGLYCERIN IN D5W 200-5 MCG/ML-% IV SOLN: 0.0000 ug/min | INTRAVENOUS | Status: DC

## 2024-05-08 MED ORDER — LACTATED RINGERS IV SOLN: INTRAVENOUS | Status: DC | PRN

## 2024-05-08 MED ORDER — SODIUM CHLORIDE 0.9% FLUSH: 3.0000 mL | Freq: Two times a day (BID) | INTRAVENOUS | Status: DC

## 2024-05-08 MED ORDER — NOREPINEPHRINE BITARTRATE 1 MG/ML IV SOLN
INTRAVENOUS | Status: DC | PRN
  Administered 2024-05-08 (×3): .5 mL via INTRAVENOUS
  Administered 2024-05-08: 1 mL via INTRAVENOUS
  Administered 2024-05-08: .5 mL via INTRAVENOUS

## 2024-05-08 MED ORDER — ASPIRIN 81 MG PO CHEW: 324.0000 mg | CHEWABLE_TABLET | Freq: Every day | ORAL | Status: DC

## 2024-05-08 MED ORDER — 0.9 % SODIUM CHLORIDE (POUR BTL) OPTIME
TOPICAL | Status: DC | PRN
  Administered 2024-05-08: 5000 mL

## 2024-05-08 MED ORDER — DEXMEDETOMIDINE HCL IN NACL 200 MCG/50ML IV SOLN
INTRAVENOUS | Status: DC | PRN
Start: 2024-05-08 — End: 2024-05-08
  Administered 2024-05-08: .5 ug/kg/h via INTRAVENOUS

## 2024-05-08 MED ORDER — ASPIRIN 81 MG PO CHEW
324.0000 mg | CHEWABLE_TABLET | Freq: Once | ORAL | Status: AC
  Administered 2024-05-08: 324 mg via ORAL
  Filled 2024-05-08: qty 4

## 2024-05-08 MED ORDER — PROPOFOL 10 MG/ML IV BOLUS
INTRAVENOUS | Status: AC
  Filled 2024-05-08: qty 20

## 2024-05-08 MED ORDER — MORPHINE SULFATE (PF) 2 MG/ML IV SOLN
1.0000 mg | INTRAVENOUS | Status: DC | PRN
  Administered 2024-05-08: 1 mg via INTRAVENOUS
  Filled 2024-05-08: qty 1

## 2024-05-08 MED ORDER — METOPROLOL TARTRATE 5 MG/5ML IV SOLN
2.5000 mg | INTRAVENOUS | Status: DC | PRN
  Administered 2024-05-12: 5 mg via INTRAVENOUS
  Administered 2024-05-14: 2.5 mg via INTRAVENOUS
  Filled 2024-05-08 (×2): qty 5

## 2024-05-08 MED ORDER — VANCOMYCIN HCL IN DEXTROSE 1-5 GM/200ML-% IV SOLN
1000.0000 mg | Freq: Once | INTRAVENOUS | Status: AC
  Administered 2024-05-08: 1000 mg via INTRAVENOUS
  Filled 2024-05-08: qty 200

## 2024-05-08 MED ORDER — PLASMA-LYTE A IV SOLN
INTRAVENOUS | Status: DC | PRN
  Administered 2024-05-08: 500 mL via INTRAVASCULAR

## 2024-05-08 MED ORDER — BISACODYL 5 MG PO TBEC
10.0000 mg | DELAYED_RELEASE_TABLET | Freq: Every day | ORAL | Status: DC
  Administered 2024-05-09 – 2024-05-19 (×9): 10 mg via ORAL
  Filled 2024-05-08 (×9): qty 2

## 2024-05-08 MED ORDER — DEXMEDETOMIDINE HCL IN NACL 400 MCG/100ML IV SOLN: 0.0000 ug/kg/h | INTRAVENOUS | Status: DC

## 2024-05-08 MED ORDER — ONDANSETRON HCL 4 MG/2ML IJ SOLN
INTRAMUSCULAR | Status: DC | PRN
  Administered 2024-05-08: 4 mg via INTRAVENOUS

## 2024-05-08 MED ORDER — MIDAZOLAM HCL 2 MG/2ML IJ SOLN
INTRAMUSCULAR | Status: AC
  Administered 2024-05-08: 2 mg via INTRAVENOUS
  Filled 2024-05-08: qty 2

## 2024-05-08 MED ORDER — OXYCODONE HCL 5 MG PO TABS
5.0000 mg | ORAL_TABLET | ORAL | Status: DC | PRN
  Administered 2024-05-08 – 2024-05-09 (×3): 5 mg via ORAL
  Administered 2024-05-12: 10 mg via ORAL
  Filled 2024-05-08: qty 1
  Filled 2024-05-08: qty 2
  Filled 2024-05-08 (×2): qty 1

## 2024-05-08 MED ORDER — CHLORHEXIDINE GLUCONATE 4 % EX SOLN: 30.0000 mL | CUTANEOUS | Status: DC

## 2024-05-08 MED ORDER — VASOPRESSIN 20 UNIT/ML IV SOLN
INTRAVENOUS | Status: DC | PRN
Start: 2024-05-08 — End: 2024-05-08
  Administered 2024-05-08 (×3): 1 [IU] via INTRAVENOUS

## 2024-05-08 MED ORDER — POTASSIUM CHLORIDE 10 MEQ/50ML IV SOLN
10.0000 meq | INTRAVENOUS | Status: AC
  Administered 2024-05-08 (×3): 10 meq via INTRAVENOUS
  Filled 2024-05-08 (×3): qty 50

## 2024-05-08 SURGICAL SUPPLY — 82 items
BAG DECANTER FOR FLEXI CONT (MISCELLANEOUS) ×4 IMPLANT
BLADE CLIPPER SURG (BLADE) ×2 IMPLANT
BLADE STERNUM SYSTEM 6 (BLADE) ×2 IMPLANT
BLOWER MISTER CAL-MED (MISCELLANEOUS) ×2 IMPLANT
BNDG ELASTIC 4INX 5YD STR LF (GAUZE/BANDAGES/DRESSINGS) IMPLANT
BNDG ELASTIC 4X5.8 VLCR STR LF (GAUZE/BANDAGES/DRESSINGS) ×2 IMPLANT
BNDG ELASTIC 6INX 5YD STR LF (GAUZE/BANDAGES/DRESSINGS) ×2 IMPLANT
BNDG GAUZE DERMACEA FLUFF 4 (GAUZE/BANDAGES/DRESSINGS) ×2 IMPLANT
CABLE SURGICAL S-101-97-12 (CABLE) IMPLANT
CANISTER SUCTION 3000ML PPV (SUCTIONS) ×2 IMPLANT
CANNULA MC2 2 STG 29/37 NON-V (CANNULA) ×2 IMPLANT
CANNULA NON VENT 20FR 12 (CANNULA) ×2 IMPLANT
CLIP RETRACTION 3.0MM CORONARY (MISCELLANEOUS) ×2 IMPLANT
CLIP TI MEDIUM 24 (CLIP) IMPLANT
CLIP TI WIDE RED SMALL 24 (CLIP) IMPLANT
CONN ST 1/2X1/2 BEN (MISCELLANEOUS) ×2 IMPLANT
DERMABOND ADVANCED .7 DNX12 (GAUZE/BANDAGES/DRESSINGS) IMPLANT
DRAIN CHANNEL 19F RND (DRAIN) IMPLANT
DRAIN CONNECTOR BLAKE 1:1 (MISCELLANEOUS) IMPLANT
DRAPE INCISE IOBAN 66X45 STRL (DRAPES) ×2 IMPLANT
DRAPE SRG 135X102X78XABS (DRAPES) ×2 IMPLANT
DRAPE WARM FLUID 44X44 (DRAPES) ×2 IMPLANT
DRSG AQUACEL AG ADV 3.5X10 (GAUZE/BANDAGES/DRESSINGS) IMPLANT
DRSG COVADERM 4X14 (GAUZE/BANDAGES/DRESSINGS) ×2 IMPLANT
ELECTRODE REM PT RTRN 9FT ADLT (ELECTROSURGICAL) ×4 IMPLANT
FELT TEFLON 1X6 (MISCELLANEOUS) ×4 IMPLANT
GAUZE 4X4 16PLY ~~LOC~~+RFID DBL (SPONGE) ×2 IMPLANT
GAUZE SPONGE 4X4 12PLY STRL (GAUZE/BANDAGES/DRESSINGS) ×4 IMPLANT
GLOVE BIO SURGEON STRL SZ7 (GLOVE) ×4 IMPLANT
GLOVE BIOGEL PI IND STRL 7.5 (GLOVE) ×4 IMPLANT
GLOVE SURG SS PI 7.5 STRL IVOR (GLOVE) IMPLANT
GOWN STRL REUS W/ TWL LRG LVL3 (GOWN DISPOSABLE) ×8 IMPLANT
GOWN STRL REUS W/ TWL XL LVL3 (GOWN DISPOSABLE) ×4 IMPLANT
GOWN STRL SURGICAL XL XLNG (GOWN DISPOSABLE) ×4 IMPLANT
HANDLE YANKAUER SUCT OPEN TIP (MISCELLANEOUS) IMPLANT
HEMOSTAT POWDER SURGIFOAM 1G (HEMOSTASIS) ×6 IMPLANT
INSERT SUTURE HOLDER (MISCELLANEOUS) ×2 IMPLANT
KIT BASIN OR (CUSTOM PROCEDURE TRAY) ×2 IMPLANT
KIT SUCTION CATH 14FR (SUCTIONS) ×6 IMPLANT
KIT TURNOVER KIT B (KITS) ×2 IMPLANT
KIT VASOVIEW HEMOPRO 2 VH 4000 (KITS) ×2 IMPLANT
KNIFE MICRO-UNI 3.5 30 DEG (BLADE) IMPLANT
LEAD PACING MYOCARDI (MISCELLANEOUS) ×2 IMPLANT
MARKER GRAFT CORONARY BYPASS (MISCELLANEOUS) ×6 IMPLANT
NS IRRIG 1000ML POUR BTL (IV SOLUTION) ×10 IMPLANT
OFFPUMP STABILIZER SUV (MISCELLANEOUS) ×2 IMPLANT
PACK E OPEN HEART (SUTURE) ×2 IMPLANT
PACK OPEN HEART (CUSTOM PROCEDURE TRAY) ×2 IMPLANT
PAD ARMBOARD POSITIONER FOAM (MISCELLANEOUS) ×4 IMPLANT
PAD ELECT DEFIB RADIOL ZOLL (MISCELLANEOUS) ×2 IMPLANT
PENCIL BUTTON HOLSTER BLD 10FT (ELECTRODE) ×2 IMPLANT
POSITIONER ACROBAT-I OFFPUMP (MISCELLANEOUS) ×2 IMPLANT
POSITIONER HEAD DONUT 9IN (MISCELLANEOUS) ×2 IMPLANT
PUNCH AORTIC ROTATE 4.0MM (MISCELLANEOUS) IMPLANT
PUNCH AORTIC ROTATE 4.5MM 8IN (MISCELLANEOUS) IMPLANT
SHUNT CORONARY AXIUS 1.0 (MISCELLANEOUS) IMPLANT
SHUNT FLO COIL 1.50MM (MISCELLANEOUS) IMPLANT
SHUNT FLO COIL 2.00MM (MISCELLANEOUS) IMPLANT
SPONGE T-LAP 18X18 ~~LOC~~+RFID (SPONGE) ×8 IMPLANT
SUPPORT HEART JANKE-BARRON (MISCELLANEOUS) ×2 IMPLANT
SUT BONE WAX W31G (SUTURE) ×2 IMPLANT
SUT ETHIBOND X763 2 0 SH 1 (SUTURE) IMPLANT
SUT MNCRL AB 3-0 PS2 18 (SUTURE) ×4 IMPLANT
SUT PDS AB 1 CTX 36 (SUTURE) ×4 IMPLANT
SUT PROLENE 3 0 SH DA (SUTURE) ×2 IMPLANT
SUT PROLENE 5 0 C 1 36 (SUTURE) ×6 IMPLANT
SUT PROLENE 7 0 BV1 MDA (SUTURE) IMPLANT
SUT SILK 1 MH (SUTURE) IMPLANT
SUT SILK 2 0 SH CR/8 (SUTURE) IMPLANT
SUT STEEL 6MS V (SUTURE) ×4 IMPLANT
SUT VIC AB 2-0 CT1 TAPERPNT 27 (SUTURE) IMPLANT
SUT VIC AB 2-0 CTX 36 (SUTURE) IMPLANT
SYSTEM SAHARA CHEST DRAIN ATS (WOUND CARE) ×2 IMPLANT
TAPE CLOTH SURG 4X10 WHT LF (GAUZE/BANDAGES/DRESSINGS) IMPLANT
TAPE PAPER 2X10 WHT MICROPORE (GAUZE/BANDAGES/DRESSINGS) IMPLANT
TAPES RETRACTO (MISCELLANEOUS) ×4 IMPLANT
TOWEL GREEN STERILE (TOWEL DISPOSABLE) ×2 IMPLANT
TOWEL GREEN STERILE FF (TOWEL DISPOSABLE) ×2 IMPLANT
TRAY FOLEY SLVR 16FR TEMP STAT (SET/KITS/TRAYS/PACK) ×2 IMPLANT
TUBING LAP HI FLOW INSUFFLATIO (TUBING) ×2 IMPLANT
UNDERPAD 30X36 HEAVY ABSORB (UNDERPADS AND DIAPERS) ×2 IMPLANT
WATER STERILE IRR 1000ML POUR (IV SOLUTION) ×4 IMPLANT

## 2024-05-08 NOTE — Progress Notes (Signed)
 Echocardiogram Echocardiogram Transesophageal has been performed.  Edgar Garcia 05/08/2024, 5:51 PM

## 2024-05-08 NOTE — Anesthesia Procedure Notes (Signed)
 Central Venous Catheter Insertion Performed by: Maryclare Cornet, MD, anesthesiologist Start/End8/21/2025 9:54 AM, 05/08/2024 10:03 AM Patient location: Pre-op. Preanesthetic checklist: patient identified, IV checked, site marked, risks and benefits discussed, surgical consent, monitors and equipment checked, pre-op evaluation, timeout performed and anesthesia consent Lidocaine  1% used for infiltration and patient sedated Hand hygiene performed  and maximum sterile barriers used  Catheter size: 8.5 Fr Sheath introducer Procedure performed using ultrasound guided technique. Ultrasound Notes:anatomy identified, needle tip was noted to be adjacent to the nerve/plexus identified, no ultrasound evidence of intravascular and/or intraneural injection and image(s) printed for medical record Attempts: 1 Following insertion, line sutured and dressing applied. Post procedure assessment: blood return through all ports, free fluid flow and no air  Patient tolerated the procedure well with no immediate complications. Additional procedure comments: 3-lumen SLIC placed into the introducer.SABRA

## 2024-05-08 NOTE — Procedures (Signed)
 Extubation Procedure Note  Patient Details:   Name: Edgar Garcia DOB: Feb 01, 1951 MRN: [id number]   Airway Documentation:    Vent end date: 05/08/24 Vent end time: 1736   Evaluation  O2 sats: stable throughout Complications: No apparent complications Patient did tolerate procedure well. Bilateral Breath Sounds: Clear, Diminished   Yes  Pt did NIF -23  Lashell Moffitt 05/08/2024, 6:15 PM

## 2024-05-08 NOTE — Hospital Course (Addendum)
 History of Present Illness:     Edgar Garcia is a 73 y.o. 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 He has a past medical history of hypertension, hyperlipidemia, prediabetes, and a history of heavy alcohol use. He has been symptomatic with exertional chest pain and shortness of breath as well as multiple syncopal episodes. He states that he has been having a hard time with his medication and would like to come off of some of them. Cardiac catheterization on 02/28/24 showed a severely calcified and tortuous LAD with 70% proximal diffuse disease, 99% mid LAD stenosis (likely healed dissection from previously unsuccessful PCI), as well as 80% proximal RCA stenosis but this is a small nondominant vessel. 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   Dr. Shyrl reviewed the patient's diagnostic studies and determined he would benefit from surgical intervention. He reviewed the patient's treatment options as well as the risks and benefits of surgery with the patient. Edgar Garcia was agreeable to proceed with surgery.   Hospital Course: Edgar Garcia presented to [facility] and was brought to the operating room on 05/08/24. He underwent off pump CABG x 2 utilizing LIMA to LAD, and SVG to Diagonal as well as endoscopic harvest of the right greater saphenous vein. He tolerated the procedure well and was transferred to the SICU in stable condition. He was extubated the evening of surgery. He required Norepinephrine  for hemodynamic support, this was weaned as hemodynamics tolerated.  He continued to have some dizziness with transitioning out of bed to the chair.  This was felt to be related to orthostatic blood pressure changes.  He was started on oral midodrine  on postop day 2. This stabilized his blood pressure and the dizziness resolved.  He was transferred to 4E progressive care. He developed atrial  fibrillation with RVR, IV Amiodarone  protocol was started. He converted to NSR, IV Amiodarone  was transitioned to PO Amiodarone . His bowels started moving appropriately on 05/13/24. He continued to have paroxysmal atrial fibrillation with RVR, cardiology was consulted and Lopressor  was titrated. Cardiology discontinued Midodrine  and restarted IV Amiodarone . The patient became hypotensive and presyncopal, Midodrine  was restarted and Lopressor  was discontinued as recommended by Dr. Wendel. Ultimately, patient converted to sinus rhythm. As of 05/19/2024 he is on Amiodarone  400 mg bid, Midodrine  5 mg bid, and Apixaban  5 mg bid. As discussed with Dr. Kerrin, will stop Midodrine  and start low dose Toprol  XL 12.5 mg daily. He did develop cellulitis of bilateral forearms (R>L) and was put on Keflex . He was instructed to place warm compresses on forearm tid. Home PT and OT were arranged. He is felt stable for discharge today.

## 2024-05-08 NOTE — Op Note (Signed)
 [address]       [place] [address]             [phone]                                         05/08/2024    Patient:  Edgar Garcia Pre-Op Dx: Coronary artery disease   Post-op Dx:  same Procedure: Off pump CABG X 2.  LIMA LAD, RSVG Diagonal   Endoscopic greater saphenous vein harvest on the right   Surgeon and Role:      * Gracee Ratterree, Linnie KIDD, MD - Primary    * EMERSON Becket, PA-C - assisting  An experienced assistant was required given the complexity of this surgery and the standard of surgical care. The assistant was needed for exposure, dissection, suctioning, retraction of delicate tissues and sutures, instrument exchange and for overall help during this procedure.    Anesthesia  general EBL:  Blood Administration: none  Drains: 19 F blake drain: R, L, mediastinal  Wires: V Counts: correct   Indications: 73 y.o. male with 2V CAD.  He has a left dominant system.  Echocardiogram shows preserved biventricular function and no significant valvular disease.  The risks and benefits of off pump CABG X 2 (LAD, diag)  were discussed in detail.  He is agreeable to proceed  Findings: Good vein and LIMA.  Good diagonal and LAD  Operative Technique: After the risks, benefits and alternatives were thoroughly discussed, the patient was brought to the operative theatre.  All invasive lines were placed in pre-op holding.  Anesthesia was induced, and the patient was prepped and draped in normal sterile fashion.  An appropriate surgical pause was performed, and pre-operative antibiotics were dosed accordingly.  We began with simultaneous incisions along the right leg for harvesting of the greater saphenous vein and the chest for the sternotomy.  In regards to the sternotomy, this was carried down with bovie cautery, and the sternum was divided with a reciprocating saw.  Meticulous hemostasis was obtained.  The left internal thoracic artery was exposed  and harvested in in pedicled fashion.  The patient was systemically heparinized, and the artery was divided distally, and placed in a papaverine  sponge.    The sternal elevator was removed, and a retractor was placed.  The pericardium was divided in the midline and fashioned into a cradle with pericardial stitches.   We exposed the anterior wall of the heart and identified a good target on the diagonal branch.  The heart was stabilized, and an arteriotomy was created.  The vein was anastomosed using 7'0 proline in an end to side fashion.  Next, we exposed a good target on the LAD, and fashioned an end to side anastomosis between it and the LITA.  Meticulous hemostasis was obtained.    A partial occludding clamp was then placed on the ascending aorta, and we created an end to side anastomosis between it and the proximal vein graft.  The proximal site was marked with a ring.  Hemostasis was obtained, and the heparin  was reversed with protamine .  Chest tubes and wires were placed, and the sternum was re-approximated with with sternal wires.  The soft tissue and skin were re-approximated wth absorbable suture.    The patient tolerated the procedure without any immediate complications, and was transferred to the ICU in guarded  condition.  Easter Kennebrew MALVA Rayas

## 2024-05-08 NOTE — Interval H&P Note (Signed)
 History and Physical Interval Note:  05/08/2024 9:26 AM  Edgar Garcia  has presented today for surgery, with the diagnosis of CAD.  The various methods of treatment have been discussed with the patient and family. After consideration of risks, benefits and other options for treatment, the patient has consented to  Procedure(s): OFF PUMP CORONARY ARTERY BYPASS GRAFTING (CABG) (N/A) ECHOCARDIOGRAM, TRANSESOPHAGEAL (N/A) as a surgical intervention.  The patient's history has been reviewed, patient examined, no change in status, stable for surgery.  I have reviewed the patient's chart and labs.  Questions were answered to the patient's satisfaction.     [name]

## 2024-05-08 NOTE — Consult Note (Signed)
 NAME:  Edgar Garcia, MRN:  [id number], DOB:  05-28-1951, LOS: 0 ADMISSION DATE:  05/08/2024, CONSULTATION DATE:  05/08/24 REFERRING MD:  Shyrl , CHIEF COMPLAINT:  s/p CABG    History of Present Illness:  73 yo M hx CAD + prior unsuccessful PCI c/b coronary dissection and brief VF arrest, HTN, HLD presented for planned off pump CABG x 2 05/08/24.  Admitted to ICU post op as per pre op plan. PCCM consulted in this setting  Case unremarkable   EBL: 500 cc Blood product: n/a   Pertinent  Medical History  CAD HTN HLD Prior etoh use   Significant Hospital Events: Including procedures, antibiotic start and stop dates in addition to other pertinent events   8/21 off pump CABG x2 ICU post op   Interim History / Subjective:  pod 0 NMB not reversed   Objective    Blood pressure (!) 141/72, pulse 65, temperature 97.8 F (36.6 C), temperature source Oral, resp. rate 17, height 5' 8.5 (1.74 m), weight 75.1 kg, SpO2 98%.        Intake/Output Summary (Last 24 hours) at 05/08/2024 1351 Last data filed at 05/08/2024 1317 Gross per 24 hour  Intake 2850 ml  Output 1500 ml  Net 1350 ml   Filed Weights   05/08/24 0606  Weight: 75.1 kg    Examination: General: chronically and critically ill appearing older adult M intubated sedated  HENT: ETT secure anicteric sclera. R internal jugular CVC dressing is saturated with blood.   Lungs: Mechanically ventilated, clear  Cardiovascular: bradycardic. S1s2, pacing wires, chest tube w bloody output  Abdomen: flat soft  Extremities: RLE wrapped. L radial art line  Neuro: sedated and chemically paralyzed  GU: foley   Resolved problem list   Assessment and Plan    CAD s/p off pump CABG x 2  Expected ABLA P -post op, lines tubes drains per CVTS -complete txa -insulin  gtt -abx ppx  -wean NE  -follow cardiac indices  -multimodal analgesia, early mobility  -post op labs  Endotracheally intubated  -CXR ABG, hopefully rapid vent  wean -VAP, pulm hygiene   Hx HTN Hx HLD -holding antihypertensives, on NE  -to resume statin 8/22   Hypokalemia -replace, trend BMP    Best Practice (right click and Reselect all SmartList Selections daily)   Diet/type: NPO DVT prophylaxis not indicated -- completing TXA  Pressure ulcer(s): N/A GI prophylaxis: PPI Lines: Central line, Arterial Line, and yes and it is still needed Foley:  Yes, and it is still needed Code Status:  full code Last date of multidisciplinary goals of care discussion [--]  Labs   CBC: Recent Labs  Lab 05/06/24 1024 05/08/24 1112 05/08/24 1115 05/08/24 1156 05/08/24 1310  WBC 7.3  --   --   --   --   HGB 16.0 10.9* 11.2* 11.9* 11.2*  HCT 45.0 32.0* 33.0* 35.0* 33.0*  MCV 98.7  --   --   --   --   PLT 287  --   --   --   --     Basic Metabolic Panel: Recent Labs  Lab 05/06/24 1024 05/08/24 1112 05/08/24 1115 05/08/24 1156 05/08/24 1310  NA 130* 133* 133* 133* 136  K 3.9 3.3* 3.3* 3.9 3.4*  CL 96* 98  --  97* 100  CO2 25  --   --   --   --   GLUCOSE 111* 119*  --  113* 120*  BUN 7* 3*  --  4* 3*  CREATININE 0.83 0.40*  --  0.40* 0.40*  CALCIUM  9.1  --   --   --   --    GFR: Estimated Creatinine Clearance: 78.5 mL/min (A) (by C-G formula based on SCr of 0.4 mg/dL (L)). Recent Labs  Lab 05/06/24 1024  WBC 7.3    Liver Function Tests: Recent Labs  Lab 05/06/24 1024  AST 20  ALT 15  ALKPHOS 68  BILITOT 1.1  PROT 7.3  ALBUMIN  4.4   No results for input(s): LIPASE, AMYLASE in the last 168 hours. No results for input(s): AMMONIA in the last 168 hours.  ABG    Component Value Date/Time   PHART 7.360 05/08/2024 1115   PCO2ART 36.5 05/08/2024 1115   PO2ART 517 (H) 05/08/2024 1115   HCO3 20.6 05/08/2024 1115   TCO2 22 05/08/2024 1310   ACIDBASEDEF 4.0 (H) 05/08/2024 1115   O2SAT 100 05/08/2024 1115     Coagulation Profile: Recent Labs  Lab 05/06/24 1024  INR 1.0    Cardiac Enzymes: No results for  input(s): CKTOTAL, CKMB, CKMBINDEX, TROPONINI in the last 168 hours.  HbA1C: Hgb A1c MFr Bld  Date/Time Value Ref Range Status  05/06/2024 10:24 AM 5.3 4.8 - 5.6 % Final    Comment:    (NOTE) Diagnosis of Diabetes The following HbA1c ranges recommended by the American Diabetes Association (ADA) may be used as an aid in the diagnosis of diabetes mellitus.  Hemoglobin             Suggested A1C NGSP%              Diagnosis  <5.7                   Non Diabetic  5.7-6.4                Pre-Diabetic  >6.4                   Diabetic  <7.0                   Glycemic control for                       adults with diabetes.    07/27/2021 05:12 AM 5.6 4.8 - 5.6 % Final    Comment:    (NOTE) Pre diabetes:          5.7%-6.4%  Diabetes:              >6.4%  Glycemic control for   <7.0% adults with diabetes     CBG: No results for input(s): GLUCAP in the last 168 hours.  Review of Systems:   Unable to obtain intubated sedated   Past Medical History:  He,  has a past medical history of Anginal pain (HCC), Anxiety, Arthritis, CAD (coronary artery disease), GERD (gastroesophageal reflux disease), Hyperlipidemia, Hypertension, Myocardial infarction [facility]), Stroke (HCC), Substance abuse (HCC), and TIA (transient ischemic attack).   Surgical History:   Past Surgical History:  Procedure Laterality Date   COLONOSCOPY     CORONARY BALLOON ANGIOPLASTY N/A 07/26/2021   Procedure: CORONARY BALLOON ANGIOPLASTY;  Surgeon: Elmira Newman PARAS, MD;  Location: [facility] INVASIVE CV [facility];  Service: Cardiovascular;  Laterality: N/A;   LEFT HEART CATH AND CORONARY ANGIOGRAPHY N/A 07/26/2021   Procedure: LEFT HEART CATH AND CORONARY ANGIOGRAPHY;  Surgeon: Elmira Newman PARAS, MD;  Location: [facility] INVASIVE CV [facility];  Service: Cardiovascular;  Laterality:  N/A;   LEFT HEART CATH AND CORONARY ANGIOGRAPHY N/A 02/28/2024   Procedure: LEFT HEART CATH AND CORONARY ANGIOGRAPHY;  Surgeon: Elmira Newman PARAS,  MD;  Location: [facility] INVAS[facility] CV [facility];  Service: Cardiovascular;  Laterality: N/A;     Social History:   reports that he quit smoking about 12 years ago. His smoking use included cigarettes. He started smoking about 37 years ago. He has a 12.5 pack-year smoking history. He has never used smokeless tobacco. He reports that he does not currently use alcohol. He reports that he does not use drugs.   Family History:  His family history includes Cancer in his mother; Colon polyps in his brother; Hypertension in his brother, sister, sister, and sister. There is no history of Colon cancer, Esophageal cancer, Rectal cancer, or Stomach cancer.   Allergies Allergies  Allergen Reactions   Brilinta  [Ticagrelor ] Other (See Comments)    Headaches    Isordil  [Isosorbide  Nitrate] Other (See Comments)    Headaches     Home Medications  Prior to Admission medications   Medication Sig Start Date End Date Taking? Authorizing Provider  amLODipine  (NORVASC ) 5 MG tablet Take 5 mg by mouth in the morning. 06/14/21  Yes [provider]  aspirin  81 MG EC tablet Take 81 mg by mouth in the morning. 10/19/15  Yes [provider]  atenolol  (TENORMIN ) 25 MG tablet Take 1 tablet (25 mg total) by mouth daily. 01/17/24  Yes Patwardhan, Manish J, MD  atorvastatin  (LIPITOR) 40 MG tablet TAKE 1 TABLET(40 MG) BY MOUTH EVERY EVENING 03/05/24  Yes Patwardhan, Manish J, MD  lisinopril  (ZESTRIL ) 20 MG tablet TAKE 1 TABLET BY MOUTH ONCE DAILY 01/30/24  Yes Patwardhan, Manish J, MD  pantoprazole  (PROTONIX ) 40 MG tablet Take 1 tablet (40 mg total) by mouth 2 (two) times daily. Please schedule a yearly follow up for further refills. Thank you 04/01/24  Yes Stacia Glendia BRAVO, MD  ranolazine  (RANEXA ) 1000 MG SR tablet Take 1 tablet (1,000 mg total) by mouth 2 (two) times daily. 01/17/24  Yes Patwardhan, Manish J, MD  vitamin B-12 (CYANOCOBALAMIN) 1000 MCG tablet Take 1,000 mcg by mouth in the morning.   Yes [provider]  hydrALAZINE  (APRESOLINE ) 50 MG tablet Take 1 tablet (50 mg total) by mouth 3 (three) times daily as needed (As needed for blood pressure spikes). 04/25/24   Patwardhan, Newman PARAS, MD  nitroGLYCERIN  (NITROSTAT ) 0.4 MG SL tablet Place 1 tablet (0.4 mg total) under the tongue every 5 (five) minutes as needed for chest pain. 06/29/21 05/06/24  Elmira Newman PARAS, MD     Critical care time: 36 min       CRITICAL CARE Performed by: Ronnald BRAVO Gave   Total critical care time: 36 minutes  Critical care time was exclusive of separately billable procedures and treating other patients. Critical care was necessary to treat or prevent imminent or life-threatening deterioration.  Critical care was time spent personally by me on the following activities: development of treatment plan with patient and/or surrogate as well as nursing, discussions with consultants, evaluation of patient's response to treatment, examination of patient, obtaining history from patient or surrogate, ordering and performing treatments and interventions, ordering and review of laboratory studies, ordering and review of radiographic studies, pulse oximetry and re-evaluation of patient's condition.  Ronnald Gave MSN, AGACNP-BC [facility] Amion for pager  05/08/2024, 2:24 PM

## 2024-05-08 NOTE — Progress Notes (Signed)
 Patient ID: Edgar Garcia, male   DOB: 26-Oct-1950, 73 y.o.   MRN: [id number]   TCTS Evening Rounds:   Hemodynamically stable  CI = 1.9  Just extubated.  Urine output good  CT output low  CBC    Component Value Date/Time   WBC 10.7 (H) 05/08/2024 1415   RBC 3.49 (L) 05/08/2024 1415   HGB 12.3 (L) 05/08/2024 1415   HGB 14.6 02/22/2024 0848   HCT 34.0 (L) 05/08/2024 1415   HCT 43.1 02/22/2024 0848   PLT 235 05/08/2024 1415   PLT 328 02/22/2024 0848   MCV 97.4 05/08/2024 1415   MCV 104 (H) 02/22/2024 0848   MCH 35.2 (H) 05/08/2024 1415   MCHC 36.2 (H) 05/08/2024 1415   RDW 11.5 05/08/2024 1415   RDW 11.7 02/22/2024 0848   LYMPHSABS 1.3 02/22/2024 0848   MONOABS 1.0 10/05/2022 1727   EOSABS 0.1 02/22/2024 0848   BASOSABS 0.0 02/22/2024 0848     BMET    Component Value Date/Time   NA 136 05/08/2024 1310   NA 131 (L) 02/22/2024 0848   K 3.4 (L) 05/08/2024 1310   CL 100 05/08/2024 1310   CO2 25 05/06/2024 1024   GLUCOSE 120 (H) 05/08/2024 1310   BUN 3 (L) 05/08/2024 1310   BUN 8 02/22/2024 0848   CREATININE 0.40 (L) 05/08/2024 1310   CALCIUM  9.1 05/06/2024 1024   EGFR 95 02/22/2024 0848   GFRNONAA >60 05/06/2024 1024     A/P:  Stable postop course. Continue current plans

## 2024-05-08 NOTE — Transfer of Care (Addendum)
 Immediate Anesthesia Transfer of Care Note  Patient: Edgar Garcia  Procedure(s) Performed: OFF PUMP CORONARY ARTERY BYPASS GRAFTING (CABG) TIMES TWO, USING LEFT INTERNAL MAMMARY ARTERY AND RIGHT LEG GREATER SAPHENOUS VEIN HARVESTED ENDOSCOPICALLY (Chest) ECHOCARDIOGRAM, TRANSESOPHAGEAL  Patient Location: 2H  Anesthesia Type:General  Level of Consciousness: Patient remains intubated per anesthesia plan  Airway & Oxygen Therapy: Patient remains intubated per anesthesia plan  Post-op Assessment: Report given to RN and Post -op Vital signs reviewed and stable  Post vital signs: Reviewed and stable  Last Vitals:  Vitals Value Taken Time  BP    Temp 33.2 C 05/08/24 14:22  Pulse 49 05/08/24 14:22  Resp 14 05/08/24 14:22  SpO2 100 % 05/08/24 14:22  Vitals shown include unfiled device data.  Last Pain:  Vitals:   05/08/24 0606  TempSrc: Oral  PainSc:       Patients Stated Pain Goal: 0 (05/08/24 0600)  Complications: No notable events documented.

## 2024-05-08 NOTE — Brief Op Note (Signed)
 05/08/2024  12:50 PM  PATIENT:  [name]  73 y.o. male  PRE-OPERATIVE DIAGNOSIS:  CORONARY ARTERY DISEASE  POST-OPERATIVE DIAGNOSIS:  CORONARY ARTERY DISEASE  PROCEDURE:  OFF-PUMP CORONARY ARTERY BYPASS GRAFTING (CABG) X 2, USING LEFT INTERNAL MAMMARY ARTERY AND RIGHT LEG GREATER SAPHENOUS VEIN HARVESTED ENDOSCOPICALLY   LIMA-LAD SVG-D1  Vein harvest time: Vein prep time:  ECHOCARDIOGRAM, TRANSESOPHAGEAL   SURGEON:  Shyrl Linnie KIDD, MD   PHYSICIAN ASSISTANT: Gurkirat Basher  ASSISTANTS: Lawernce Suzen LABOR, RN, RN First Assistant         Dyann Suzen, RN, Scrub Person   ANESTHESIA:   general  EBL:   BLOOD ADMINISTERED:none  DRAINS: Mediastinal and left pleural drains   COUNTS:  Correct  DICTATION: .Dragon Dictation  PLAN OF CARE: Admit to inpatient   PATIENT DISPOSITION:  ICU - intubated and hemodynamically stable.   Delay start of Pharmacological VTE agent (>24hrs) due to surgical blood loss or risk of bleeding: yes

## 2024-05-08 NOTE — Anesthesia Procedure Notes (Signed)
 Procedure Name: Intubation Date/Time: 05/08/2024 10:32 AM  Performed by: Cena Epps, CRNAPre-anesthesia Checklist: Patient identified, Emergency Drugs available, Suction available and Patient being monitored Patient Re-evaluated:Patient Re-evaluated prior to induction Oxygen Delivery Method: Circle System Utilized Preoxygenation: Pre-oxygenation with 100% oxygen Induction Type: IV induction Ventilation: Mask ventilation without difficulty Laryngoscope Size: Mac and 4 Grade View: Grade I Tube type: Oral Tube size: 8.0 mm Number of attempts: 1 Airway Equipment and Method: Stylet and Oral airway Placement Confirmation: ETT inserted through vocal cords under direct vision, positive ETCO2 and breath sounds checked- equal and bilateral Secured at: 23 cm Tube secured with: Tape Dental Injury: Teeth and Oropharynx as per pre-operative assessment

## 2024-05-09 ENCOUNTER — Other Ambulatory Visit: Payer: Self-pay

## 2024-05-09 ENCOUNTER — Encounter (HOSPITAL_COMMUNITY): Payer: Self-pay | Admitting: Thoracic Surgery (Cardiothoracic Vascular Surgery)

## 2024-05-09 ENCOUNTER — Inpatient Hospital Stay (HOSPITAL_COMMUNITY)

## 2024-05-09 DIAGNOSIS — I2510 Atherosclerotic heart disease of native coronary artery without angina pectoris: Secondary | ICD-10-CM

## 2024-05-09 DIAGNOSIS — J939 Pneumothorax, unspecified: Secondary | ICD-10-CM | POA: Diagnosis not present

## 2024-05-09 DIAGNOSIS — E785 Hyperlipidemia, unspecified: Secondary | ICD-10-CM | POA: Diagnosis not present

## 2024-05-09 DIAGNOSIS — J9600 Acute respiratory failure, unspecified whether with hypoxia or hypercapnia: Secondary | ICD-10-CM | POA: Diagnosis not present

## 2024-05-09 LAB — BASIC METABOLIC PANEL WITH GFR
Anion gap: 6 (ref 5–15)
Anion gap: 7 (ref 5–15)
BUN: 5 mg/dL — ABNORMAL LOW (ref 8–23)
BUN: 8 mg/dL (ref 8–23)
CO2: 24 mmol/L (ref 22–32)
CO2: 25 mmol/L (ref 22–32)
Calcium: 7.6 mg/dL — ABNORMAL LOW (ref 8.9–10.3)
Calcium: 8 mg/dL — ABNORMAL LOW (ref 8.9–10.3)
Chloride: 100 mmol/L (ref 98–111)
Chloride: 95 mmol/L — ABNORMAL LOW (ref 98–111)
Creatinine, Ser: 0.75 mg/dL (ref 0.61–1.24)
Creatinine, Ser: 0.68 mg/dL (ref 0.61–1.24)
GFR, Estimated: >60 mL/min (ref >60)
GFR, Estimated: >60 mL/min (ref >60)
Glucose, Bld: 114 mg/dL — ABNORMAL HIGH (ref 70–99)
Glucose, Bld: 128 mg/dL — ABNORMAL HIGH (ref 70–99)
Potassium: 3.5 mmol/L (ref 3.5–5.1)
Potassium: 3.9 mmol/L (ref 3.5–5.1)
Sodium: 130 mmol/L — ABNORMAL LOW (ref 135–145)
Sodium: 127 mmol/L — ABNORMAL LOW (ref 135–145)

## 2024-05-09 LAB — CBC
HCT: 27.8 % — ABNORMAL LOW (ref 39.0–52.0)
HCT: 27.6 % — ABNORMAL LOW (ref 39.0–52.0)
Hemoglobin: 10 g/dL — ABNORMAL LOW (ref 13.0–17.0)
Hemoglobin: 9.5 g/dL — ABNORMAL LOW (ref 13.0–17.0)
MCH: 35.3 pg — ABNORMAL HIGH (ref 26.0–34.0)
MCH: 35.1 pg — ABNORMAL HIGH (ref 26.0–34.0)
MCHC: 36 g/dL (ref 30.0–36.0)
MCHC: 34.4 g/dL (ref 30.0–36.0)
MCV: 98.2 fL (ref 80.0–100.0)
MCV: 101.8 fL — ABNORMAL HIGH (ref 80.0–100.0)
Platelets: 252 K/uL (ref 150–400)
Platelets: 216 K/uL (ref 150–400)
RBC: 2.83 MIL/uL — ABNORMAL LOW (ref 4.22–5.81)
RBC: 2.71 MIL/uL — ABNORMAL LOW (ref 4.22–5.81)
RDW: 11.7 % (ref 11.5–15.5)
RDW: 11.9 % (ref 11.5–15.5)
WBC: 11.6 K/uL — ABNORMAL HIGH (ref 4.0–10.5)
WBC: 12.4 K/uL — ABNORMAL HIGH (ref 4.0–10.5)
nRBC: 0 % (ref 0.0–0.2)
nRBC: 0 % (ref 0.0–0.2)

## 2024-05-09 LAB — GLUCOSE, CAPILLARY
Glucose-Capillary: 120 mg/dL — ABNORMAL HIGH (ref 70–99)
Glucose-Capillary: 114 mg/dL — ABNORMAL HIGH (ref 70–99)
Glucose-Capillary: 124 mg/dL — ABNORMAL HIGH (ref 70–99)
Glucose-Capillary: 142 mg/dL — ABNORMAL HIGH (ref 70–99)
Glucose-Capillary: 130 mg/dL — ABNORMAL HIGH (ref 70–99)
Glucose-Capillary: 125 mg/dL — ABNORMAL HIGH (ref 70–99)
Glucose-Capillary: 122 mg/dL — ABNORMAL HIGH (ref 70–99)
Glucose-Capillary: 94 mg/dL (ref 70–99)

## 2024-05-09 LAB — MAGNESIUM
Magnesium: 2.2 mg/dL (ref 1.7–2.4)
Magnesium: 2.2 mg/dL (ref 1.7–2.4)

## 2024-05-09 MED ORDER — ENOXAPARIN SODIUM 40 MG/0.4ML IJ SOSY
40.0000 mg | PREFILLED_SYRINGE | Freq: Every day | INTRAMUSCULAR | Status: DC
  Administered 2024-05-09 – 2024-05-13 (×5): 40 mg via SUBCUTANEOUS
  Filled 2024-05-09 (×5): qty 0.4

## 2024-05-09 MED ORDER — INSULIN ASPART 100 UNIT/ML IJ SOLN
0.0000 [IU] | INTRAMUSCULAR | Status: DC
  Administered 2024-05-09 (×3): 2 [IU] via SUBCUTANEOUS

## 2024-05-09 MED ORDER — ORAL CARE MOUTH RINSE: 15.0000 mL | OROMUCOSAL | Status: DC | PRN

## 2024-05-09 MED ORDER — INSULIN ASPART 100 UNIT/ML IJ SOLN: 0.0000 [IU] | INTRAMUSCULAR | Status: DC

## 2024-05-09 MED ORDER — POTASSIUM CHLORIDE 10 MEQ/50ML IV SOLN
10.0000 meq | INTRAVENOUS | Status: AC
  Administered 2024-05-09 (×3): 10 meq via INTRAVENOUS
  Filled 2024-05-09 (×3): qty 50

## 2024-05-09 NOTE — Consult Note (Signed)
 NAME:  Edgar Garcia, MRN:  [id number], DOB:  1950/10/28, LOS: 1 ADMISSION DATE:  05/08/2024, CONSULTATION DATE:  05/08/24 REFERRING MD:  Shyrl , CHIEF COMPLAINT:  s/p CABG    History of Present Illness:  73 yo M hx CAD + prior unsuccessful PCI c/b coronary dissection and brief VF arrest, HTN, HLD presented for planned off pump CABG x 2 05/08/24.  Admitted to ICU post op as per pre op plan. PCCM consulted in this setting  Case unremarkable   EBL: 500 cc Blood product: n/a   Pertinent  Medical History  CAD HTN HLD Prior etoh use   Significant Hospital Events: Including procedures, antibiotic start and stop dates in addition to other pertinent events   8/21 off pump CABG x2 ICU post op   Interim History / Subjective:  Patient was extubated per rapid weaning protocol Was requiring low-dose vasopressor support, now titrated off Started surgical site pain is controlled  Objective    Blood pressure 101/64, pulse 64, temperature 98.9 F (37.2 C), temperature source Oral, resp. rate 12, height 5' 8.5 (1.74 m), weight 76.1 kg, SpO2 98%. CO:  [3.3 L/min-6.6 L/min] 5.3 L/min CI:  [1.7 L/min/m2-3.5 L/min/m2] 2.8 L/min/m2  Vent Mode: Stand-by FiO2 (%):  [40 %-50 %] 40 % Set Rate:  [4 bmp-16 bmp] 4 bmp Vt Set:  [560 mL] 560 mL PEEP:  [5 cmH20] 5 cmH20 Pressure Support:  [10 cmH20] 10 cmH20 Plateau Pressure:  [16 cmH20] 16 cmH20   Intake/Output Summary (Last 24 hours) at 05/09/2024 0931 Last data filed at 05/09/2024 0800 Gross per 24 hour  Intake 6321.1 ml  Output 3745 ml  Net 2576.1 ml   Filed Weights   05/08/24 0606 05/09/24 0500  Weight: 75.1 kg 76.1 kg    Examination: General: Elderly male, sitting on the bed HEENT: [employer]/AT, eyes anicteric.  moist mucus membranes Neuro: Alert, awake following commands Chest: Coarse breath sounds, no wheezes or rhonchi.  Central sternotomy incision looks clean and dry, mediastinal, chest tubes and pacer wire in place Heart: Regular rate  and rhythm, no murmurs or gallops Abdomen: Soft, nontender, nondistended, bowel sounds present  Labs and images reviewed  Patient Lines/Drains/Airways Status     Active Line/Drains/Airways     Name Placement date Placement time Site Days   Arterial Line 05/08/24 Left 05/08/24  1403  --  1   Peripheral IV 05/08/24 20 G 1 Posterior;Right Hand 05/08/24  1403  Hand  1   Urethral Catheter M. Tice RN Latex;Straight-tip;Temperature probe 16 Fr. 05/08/24  1040  Latex;Straight-tip;Temperature probe  1   Y Chest Tube 1, 2, and 3 1 Right Pleural 19 Fr. Left Pleural 19 Fr. Medial Mediastinal 19 Fr. 05/08/24  1304  -- 1   Wound 05/08/24 1130 Surgical Closed Surgical Incision Chest Other (Comment) 05/08/24  1130  Chest  1   Wound 05/08/24 1130 Surgical Closed Surgical Incision Leg Right 05/08/24  1130  Leg  1        Resolved problem list   Assessment and Plan  Multivessel coronary artery disease s/p off-pump CABG x 2 Continue aspirin  and statin Started on low-dose metoprolol  Chest tube management TCTS Chest tube output was 830 cc in last 24 hours Continue to titrate Precedex  with RASS goal 0/-1 Continue pain control with tramadol , oxycodone  and morphine  Closely monitor chest tube output  Acute respiratory insufficiency, postop Small right-sided postop pneumothorax Patient was successfully extubated per rapid weaning protocol Encourage incentive spirometry and ambulation Titrate nasal  cannula oxygen Leave chest tube in place  Hypertension Holding antihypertensive for now  Hyperlipidemia Continue atorvastatin   Expected perioperative blood loss anemia Monitor H/H and PLT counts  Hyponatremia Hypokalemia, improved Closely monitor electrolytes   Labs   CBC: Recent Labs  Lab 05/06/24 1024 05/08/24 1112 05/08/24 1156 05/08/24 1310 05/08/24 1415 05/08/24 2045 05/09/24 0523  WBC 7.3  --   --   --  10.7* 17.0* 11.6*  NEUTROABS  --   --   --   --   --  14.9*  --   HGB 16.0    < > 11.9* 11.2* 12.3* 10.3* 10.0*  HCT 45.0   < > 35.0* 33.0* 34.0* 29.3* 27.8*  MCV 98.7  --   --   --  97.4 99.0 98.2  PLT 287  --   --   --  235 249 252   < > = values in this interval not displayed.    Basic Metabolic Panel: Recent Labs  Lab 05/06/24 1024 05/08/24 1112 05/08/24 1115 05/08/24 1156 05/08/24 1310 05/08/24 2045 05/09/24 0523  NA 130* 133* 133* 133* 136 133* 130*  K 3.9 3.3* 3.3* 3.9 3.4* 4.0 3.5  CL 96* 98  --  97* 100 101 100  CO2 25  --   --   --   --  22 24  GLUCOSE 111* 119*  --  113* 120* 135* 114*  BUN 7* 3*  --  4* 3* 6* 5*  CREATININE 0.83 0.40*  --  0.40* 0.40* 0.65 0.75  CALCIUM  9.1  --   --   --   --  7.5* 7.6*  MG  --   --   --   --   --  2.6* 2.2   GFR: Estimated Creatinine Clearance: 78.5 mL/min (by C-G formula based on SCr of 0.75 mg/dL). Recent Labs  Lab 05/06/24 1024 05/08/24 1415 05/08/24 2045 05/09/24 0523  WBC 7.3 10.7* 17.0* 11.6*    Liver Function Tests: Recent Labs  Lab 05/06/24 1024  AST 20  ALT 15  ALKPHOS 68  BILITOT 1.1  PROT 7.3  ALBUMIN  4.4   No results for input(s): LIPASE, AMYLASE in the last 168 hours. No results for input(s): AMMONIA in the last 168 hours.  ABG    Component Value Date/Time   PHART 7.360 05/08/2024 1115   PCO2ART 36.5 05/08/2024 1115   PO2ART 517 (H) 05/08/2024 1115   HCO3 20.6 05/08/2024 1115   TCO2 22 05/08/2024 1310   ACIDBASEDEF 4.0 (H) 05/08/2024 1115   O2SAT 100 05/08/2024 1115     Coagulation Profile: Recent Labs  Lab 05/06/24 1024 05/08/24 1415  INR 1.0 1.3*    Cardiac Enzymes: No results for input(s): CKTOTAL, CKMB, CKMBINDEX, TROPONINI in the last 168 hours.  HbA1C: Hgb A1c MFr Bld  Date/Time Value Ref Range Status  05/06/2024 10:24 AM 5.3 4.8 - 5.6 % Final    Comment:    (NOTE) Diagnosis of Diabetes The following HbA1c ranges recommended by the American Diabetes Association (ADA) may be used as an aid in the diagnosis of diabetes  mellitus.  Hemoglobin             Suggested A1C NGSP%              Diagnosis  <5.7                   Non Diabetic  5.7-6.4  Pre-Diabetic  >6.4                   Diabetic  <7.0                   Glycemic control for                       adults with diabetes.    07/27/2021 05:12 AM 5.6 4.8 - 5.6 % Final    Comment:    (NOTE) Pre diabetes:          5.7%-6.4%  Diabetes:              >6.4%  Glycemic control for   <7.0% adults with diabetes     CBG: Recent Labs  Lab 05/08/24 2356 05/09/24 0134 05/09/24 0516 05/09/24 0706 05/09/24 0858  GLUCAP 147* 120* 114* 124* 142*      Valinda Novas, MD [facility] See Amion for pager If no response to pager, please call [phone] until 7pm After 7pm, Please call E-link [phone]

## 2024-05-09 NOTE — Discharge Summary (Cosign Needed Addendum)
 [address] [place] [address]             [phone]        Physician Discharge Summary  Patient ID: Edgar Garcia MRN: [id number] DOB/AGE: 06/16/1951 73 y.o.  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 08/05/2021   Non-ST elevation (NSTEMI) myocardial infarction (HCC)    Post PTCA 07/26/2021   Abnormal stress test 07/20/2021   Exertional chest pain 06/29/2021   Angina pectoris (HCC) 06/29/2021   Mixed hyperlipidemia 06/29/2021   PVC (premature ventricular contraction) 03/22/2021   Heavy alcohol consumption 04/22/2018   Essential hypertension 04/15/2015   History of prediabetes 04/15/2015     Discharge Diagnoses:  Patient Active Problem List   Diagnosis Date Noted   S/P CABG x 2 05/08/2024   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) 02/26/2023   Claudication (HCC) 12/01/2022   Hyponatremia 10/23/2022   Syncope and collapse 10/23/2022   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 male who presents for surgical  evaluation of 2V CAD. He has a past medical history of hypertension, hyperlipidemia, prediabetes, and a history of heavy alcohol use. He has been symptomatic with exertional chest pain and shortness of breath as well as multiple syncopal episodes. He states that he has been having a hard time with his medication and would like to come off of some of them. Cardiac catheterization on 02/28/24 showed a severely calcified and tortuous LAD with 70% proximal diffuse Garcia, 99% mid LAD stenosis (likely healed dissection from previously unsuccessful PCI), as well as 80% proximal RCA stenosis but this is a small nondominant vessel. 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   Dr. Shyrl reviewed the patient's diagnostic studies and determined he would benefit from surgical intervention. He reviewed the patient's treatment options as well as the risks and benefits of surgery with the patient. Edgar Garcia was agreeable to proceed with surgery.   Hospital Course: Edgar Garcia presented to [facility] and was brought to the operating room on 05/08/24. He underwent off pump CABG x 2 utilizing LIMA to LAD, and SVG to Diagonal as well as endoscopic harvest of the right greater saphenous vein. He tolerated the procedure well and was transferred to the SICU in stable condition. He was extubated the evening of surgery. He required Norepinephrine  for hemodynamic support, this was weaned as hemodynamics tolerated.  He continued to have some dizziness with transitioning out of bed to the chair.  This was felt  to be related to orthostatic blood pressure changes.  He was started on oral midodrine  on postop day 2. This stabilized his blood pressure and the dizziness resolved.  He was transferred to 4E progressive care. He developed atrial fibrillation with RVR, IV Amiodarone  protocol was started. He converted to NSR, IV Amiodarone   was transitioned to PO Amiodarone . His bowels started moving appropriately on 05/13/24. He continued to have paroxysmal atrial fibrillation with RVR, cardiology was consulted and Lopressor  was titrated. Cardiology discontinued Midodrine  and restarted IV Amiodarone . The patient became hypotensive and presyncopal, Midodrine  was restarted and Lopressor  was discontinued as recommended by Dr. Wendel. Ultimately, patient converted to sinus rhythm. As of 05/19/2024 he is on Amiodarone  400 mg bid, Midodrine  5 mg bid, and Apixaban  5 mg bid. As discussed with Dr. Kerrin, will stop Midodrine  and start low dose Toprol  XL 12.5 mg daily. He did develop cellulitis of bilateral forearms (R>L) and was put on Keflex . He was instructed to place warm compresses on forearm tid. Home PT and OT were arranged. He is felt stable for discharge today.   Consults: pulmonary/intensive care  Significant Diagnostic Studies:  LEFT HEART CATH AND CORONARY ANGIOGRAPHY     Prox LAD lesion is 70% stenosed.   Prox RCA lesion is 90% stenosed.   Mid LAD lesion is 99% stenosed.   LV end diastolic pressure is mildly elevated.   Recommend Aspirin  81mg  daily for moderate CAD.   Coronary angiography 02/28/2024: LM: Normal LAD: Severely calcified and tortuous vessel          Proximal diffuse 70% Garcia          Mid 99% stenosis (likely healed dissection from previously unsuccessful PCI attempt)          Distal vessel gets antegrade flow through a small channel Lcx: Large dominant vessel, no significant Garcia RCA: Small nondominant vessel, proximal 80% stenosis   LVEDP 26 mmHg      ECHOCARDIOGRAM REPORT       Patient Name:   Edgar Garcia  Date of Exam: 04/15/2024  Medical Rec #:  [id number]     Height:       69.0 in  Accession #:    [id number]    Weight:       170.0 lb  Date of Birth:  1950-12-25      BSA:          1.928 m  Patient Age:    73 years      BP:           144/80 mmHg  Patient Gender: M             HR:            75 bpm.  Exam Location:  [address]   Procedure: 2D Echo, Cardiac Doppler and Color Doppler (Both Spectral and  Color            Flow Doppler were utilized during procedure).   Indications:    I25.10 CAD    History:        Patient has prior history of Echocardiogram examinations,  most                 recent 10/13/2021. CAD and Previous Myocardial Infarction,  TIA;                 Risk Factors:Hypertension and Dyslipidemia.    Sonographer:    Carl Coma RDCS  Referring Phys: [id number] [facility] PATWARDHAN  IMPRESSIONS     1. Left ventricular ejection fraction, by estimation, is 55 to 60%. The  left ventricle has normal function. The left ventricle has no regional  wall motion abnormalities. Left ventricular diastolic parameters are  indeterminate.   2. Right ventricular systolic function is normal. The right ventricular  size is normal. There is mildly elevated pulmonary artery systolic  pressure.   3. The mitral valve is degenerative. Mild mitral valve regurgitation. No  evidence of mitral stenosis.   4. The aortic valve is calcified. Aortic valve regurgitation is not  visualized. Aortic valve sclerosis/calcification is present, without any  evidence of aortic stenosis.   5. Aortic dilatation noted. There is mild dilatation of the ascending  aorta, measuring 38 mm.   6. The inferior vena cava is normal in size with greater than 50%  respiratory variability, suggesting right atrial pressure of 3 mmHg.   FINDINGS   Left Ventricle: Left ventricular ejection fraction, by estimation, is 55  to 60%. The left ventricle has normal function. The left ventricle has no  regional wall motion abnormalities. The left ventricular internal cavity  size was normal in size. There is   no left ventricular hypertrophy. Left ventricular diastolic parameters  are indeterminate.   Right Ventricle: The right ventricular size is normal. No increase in  right ventricular wall  thickness. Right ventricular systolic function is  normal. There is mildly elevated pulmonary artery systolic pressure. The  tricuspid regurgitant velocity is 3.09   m/s, and with an assumed right atrial pressure of 3 mmHg, the estimated  right ventricular systolic pressure is 41.2 mmHg.   Left Atrium: Left atrial size was normal in size.   Right Atrium: Right atrial size was normal in size.   Pericardium: There is no evidence of pericardial effusion.   Mitral Valve: The mitral valve is degenerative in appearance. Mild mitral  valve regurgitation. No evidence of mitral valve stenosis.   Tricuspid Valve: The tricuspid valve is normal in structure. Tricuspid  valve regurgitation is not demonstrated. No evidence of tricuspid  stenosis.   The aortic valve is calcified. Aortic valve regurgitation is not  visualized. Aortic valve sclerosis/calcification is present, without any  evidence of aortic stenosis.  Pulmonic Valve: The pulmonic valve was normal in structure. Pulmonic valve  regurgitation is not visualized. No evidence of pulmonic stenosis.   Aorta: Aortic dilatation noted. There is mild dilatation of the ascending  aorta, measuring 38 mm.   Venous: The inferior vena cava is normal in size with greater than 50%  respiratory variability, suggesting right atrial pressure of 3 mmHg.   IAS/Shunts: No atrial level shunt detected by color flow Doppler.     LEFT VENTRICLE  PLAX 2D  LVIDd:         4.60 cm   Diastology  LVIDs:         3.20 cm   LV e' medial:    6.58 cm/s  LV PW:         0.70 cm   LV E/e' medial:  11.6  LV IVS:        0.70 cm   LV e' lateral:   11.85 cm/s  LVOT diam:     2.10 cm   LV E/e' lateral: 6.5  LV SV:         75  LV SV Index:   39  LVOT Area:     3.46 cm     RIGHT VENTRICLE  IVC  RV Basal diam:  4.10 cm     IVC diam: 1.10 cm  RV S prime:     11.65 cm/s  TAPSE (M-mode): 2.0 cm   LEFT ATRIUM             Index        RIGHT ATRIUM            Index  LA diam:        3.70 cm 1.92 cm/m   RA Area:     13.50 cm  LA Vol (A2C):   51.5 ml 26.71 ml/m  RA Volume:   31.60 ml  16.39 ml/m  LA Vol (A4C):   40.4 ml 20.95 ml/m  LA Biplane Vol: 49.8 ml 25.83 ml/m   AORTIC VALVE  AV Area (Vmax):    1.63 cm  AV Area (Vmean):   1.60 cm  AV Area (VTI):     1.63 cm  AV Vmax:           194.00 cm/s  AV Vmean:          131.600 cm/s  AV VTI:            0.461 m  AV Peak Grad:      15.1 mmHg  AV Mean Grad:      8.2 mmHg  LVOT Vmax:         91.35 cm/s  LVOT Vmean:        60.900 cm/s  LVOT VTI:          0.216 m  LVOT/AV VTI ratio: 0.47    AORTA  Ao Root diam: 3.50 cm  Ao Asc diam:  3.80 cm   MITRAL VALVE               TRICUSPID VALVE  MV Area (PHT): 3.53 cm    TR Peak grad:   38.2 mmHg  MV Decel Time: 215 msec    TR Vmax:        309.00 cm/s  MV E velocity: 76.50 cm/s  MV A velocity: 70.70 cm/s  SHUNTS  MV E/A ratio:  1.08        Systemic VTI:  0.22 m                             Systemic Diam: 2.10 cm   Kardie Tobb DO  Electronically signed by Dub Huntsman DO  Signature Date/Time: 04/15/2024/12:59:27 PM      Final     Treatments: surgery: 05/08/2024 Patient:  Edgar Garcia   Post-op Dx:  same Procedure: Off pump CABG X 2.  LIMA LAD, RSVG Diagonal   Endoscopic greater saphenous vein harvest on the right Surgeon and Role:      * Lightfoot, Linnie KIDD, MD - Primary  Discharge Exam: Blood pressure (!) 143/75, pulse 71, temperature 98.2 F (36.8 C), temperature source Oral, resp. rate 17, height 5' 8.5 (1.74 m), weight 75.5 kg, SpO2 96%. Cardiovascular: RRR Pulmonary: Clear to auscultation bilaterally Abdomen: Soft, non tender, bowel sounds present. Extremities: No lower extremity edema. Bilateral forearm swelling, erythema and some tenderness R>L (IV amiodarone , infiltrated IV) Wounds: Sternal and RLE wounds are clean and dry.  No erythema or signs of infection.   Discharge  Medications:  The patient has been discharged on:   1.Beta Blocker:  Yes [  X ]  No   [   ]                              If No, reason:  2.Ace Inhibitor/ARB: Yes [   ]                                     No  [  X  ]                                     If No, reason: Soft BP  3.Statin:   Yes [ X  ]                  No  [   ]                  If No, reason:  4.Ecasa:  Yes  [ X  ]                  No   [   ]                  If No, reason:  Patient had ACS upon admission: No  Plavix /P2Y12 inhibitor: Yes [   ]                                      No  [ x  ]     Discharge Instructions     Amb Referral to Cardiac Rehabilitation   Complete by: As directed    Diagnosis: CABG   CABG X ___: 2   After initial evaluation and assessments completed: Virtual Based Care may be provided alone or in conjunction with Phase 2 Cardiac Rehab based on patient barriers.: Yes   Intensive Cardiac Rehabilitation (ICR) [facility] location only OR Traditional Cardiac Rehabilitation (TCR) *If criteria for ICR are not met will enroll in TCR ([facility] only): Yes      Allergies as of 05/19/2024       Reactions   Brilinta  [ticagrelor ] Other (See Comments)   Headaches   Isordil  [isosorbide  Nitrate] Other (See Comments)   Headaches        Medication List     STOP taking these medications    amLODipine  5 MG tablet Commonly known as: NORVASC    atenolol  25 MG tablet Commonly known as: TENORMIN    hydrALAZINE  50 MG tablet Commonly known as: APRESOLINE    lisinopril  20 MG tablet Commonly known as: ZESTRIL    nitroGLYCERIN  0.4 MG SL tablet Commonly known as: NITROSTAT    ranolazine  1000 MG SR tablet Commonly known as: RANEXA        TAKE these medications    acetaminophen  325 MG tablet Commonly known as: Tylenol  Take 2 tablets (650 mg total) by mouth every 6 (six) hours as needed for mild pain (pain score 1-3).   amiodarone  400 MG tablet Commonly known as:  PACERONE  Take 400 mg bid for 5 days;then take 400 mg daily for 7 days;then take 200 mg daily thereafter   apixaban  5 MG Tabs tablet Commonly known as: ELIQUIS  Take 1 tablet (5 mg total) by mouth 2 (two) times daily.   aspirin  EC 81 MG tablet Take  1 tablet (81 mg total) by mouth daily. Swallow whole. Start taking on: May 20, 2024 What changed:  when to take this additional instructions   atorvastatin  80 MG tablet Commonly known as: LIPITOR  Take 1 tablet (80 mg total) by mouth daily. What changed:  medication strength See the new instructions.   cephALEXin  500 MG capsule Commonly known as: KEFLEX  Take 1 capsule (500 mg total) by mouth every 8 (eight) hours.   cyanocobalamin 1000 MCG tablet Commonly known as: VITAMIN B12 Take 1,000 mcg by mouth in the morning.   metoprolol  succinate 25 MG 24 hr tablet Commonly known as: TOPROL -XL Take 0.5 tablets (12.5 mg total) by mouth daily.   oxyCODONE  5 MG immediate release tablet Commonly known as: Oxy IR/ROXICODONE  Take 1 tablet (5 mg total) by mouth every 6 (six) hours as needed for severe pain (pain score 7-10).   pantoprazole  40 MG tablet Commonly known as: PROTONIX  Take 1 tablet (40 mg total) by mouth 2 (two) times daily. Please schedule a yearly follow up for further refills. Thank you        Follow-up Information     Lucien Orren SAILOR, PA-C Follow up on 06/02/2024.   Specialty: Cardiology Why: Cardiology appointment is at 10:55AM Contact information: [address] [place] [address] [phone]         Shyrl Linnie KIDD, MD Follow up on 05/16/2024.   Specialty: Cardiothoracic Surgery Why: Virtual appointment is at 2:30PM. Please do NOT come to the office as this is a VIRTUAL appointment, Dr. Shyrl will call you. Contact information: [address] [place] [address] [phone]         [place], [facility] Follow up.   Specialty: Home Health  Services Why: HH RN/PT/OT arranged- they will contact you within 48 hr post discharge to schedule Contact information: [address] [place] [address] [phone]                 Signed:  Kyla CHRISTELLA Donald, PA-C  05/19/2024, 10:38 AM

## 2024-05-09 NOTE — Discharge Instructions (Addendum)
 Discharge Instructions:  1. You may shower, please wash incisions daily with soap and water and keep dry.  If you wish to cover wounds with dressing you may do so but please keep clean and change daily.  No tub baths or swimming until incisions have completely healed.  If your incisions become red or develop any drainage please call our office at [phone]  2. No Driving until cleared by Dr. Lang office and you are no longer using narcotic pain medications  3. Monitor your weight daily.. Please use the same scale and weigh at same time... If you gain 5-10 lbs in 48 hours with associated lower extremity swelling, please contact our office at [phone]  4. Fever of 101.5 for at least 24 hours with no source, please contact our office at [phone]  5. Activity- up as tolerated, please walk at least 3 times per day.  Avoid strenuous activity, no lifting, pushing, or pulling with your arms over 8-10 lbs for a minimum of 6 weeks  6. If any questions or concerns arise, please do not hesitate to contact our office at [phone]  7. May apply warm compresses to both forearms daily, PRN  Information on my medicine - ELIQUIS  (apixaban )  This medication education was reviewed with me or my healthcare representative as part of my discharge preparation.  Why was Eliquis  prescribed for you? Eliquis  was prescribed for you to reduce the risk of a blood clot forming that can cause a stroke if you have a medical condition called atrial fibrillation (a type of irregular heartbeat).  What do You need to know about Eliquis  ? Take your Eliquis  TWICE DAILY - one tablet in the morning and one tablet in the evening with or without food. If you have difficulty swallowing the tablet whole please discuss with your pharmacist how to take the medication safely.  Take Eliquis  exactly as prescribed by your doctor and DO NOT stop taking Eliquis  without talking to the doctor who prescribed the  medication.  Stopping may increase your risk of developing a stroke.  Refill your prescription before you run out.  After discharge, you should have regular check-up appointments with your healthcare provider that is prescribing your Eliquis .  In the future your dose may need to be changed if your kidney function or weight changes by a significant amount or as you get older.  What do you do if you miss a dose? If you miss a dose, take it as soon as you remember on the same day and resume taking twice daily.  Do not take more than one dose of ELIQUIS  at the same time to make up a missed dose.  Important Safety Information A possible side effect of Eliquis  is bleeding. You should call your healthcare provider right away if you experience any of the following: Bleeding from an injury or your nose that does not stop. Unusual colored urine (red or dark brown) or unusual colored stools (red or black). Unusual bruising for unknown reasons. A serious fall or if you hit your head (even if there is no bleeding).  Some medicines may interact with Eliquis  and might increase your risk of bleeding or clotting while on Eliquis . To help avoid this, consult your healthcare provider or pharmacist prior to using any new prescription or non-prescription medications, including herbals, vitamins, non-steroidal anti-inflammatory drugs (NSAIDs) and supplements.  This website has more information on Eliquis  (apixaban ): http://www.eliquis .com/eliquis dena

## 2024-05-09 NOTE — Progress Notes (Addendum)
 TCTS DAILY ICU PROGRESS NOTE                   [address]            [place] [address]          [phone]   1 Day Post-Op Procedure(s) (LRB): OFF PUMP CORONARY ARTERY BYPASS GRAFTING (CABG) TIMES TWO, USING LEFT INTERNAL MAMMARY ARTERY AND RIGHT LEG GREATER SAPHENOUS VEIN HARVESTED ENDOSCOPICALLY (N/A) ECHOCARDIOGRAM, TRANSESOPHAGEAL (N/A)  Total Length of Stay:  LOS: 1 day   Subjective: Extubated around 5:30pm.  Up in the bedside chair, having some neck pain and lower back pain related to his arthritis. Also had some dizziness when transitioning to the chair.  On Norepi to support BP.   Objective: Vital signs in last 24 hours: Temp:  [91.8 F (33.2 C)-99.9 F (37.7 C)] 99.3 F (37.4 C) (08/22 0545) Pulse Rate:  [46-77] 67 (08/22 0800) Cardiac Rhythm: Normal sinus rhythm (08/22 0800) Resp:  [9-22] 17 (08/22 0800) BP: (80-151)/(53-84) 101/64 (08/22 0500) SpO2:  [93 %-100 %] 98 % (08/22 0800) Arterial Line BP: (82-301)/(40-283) 121/46 (08/22 0800) FiO2 (%):  [40 %-50 %] 40 % (08/21 1641) Weight:  [76.1 kg] 76.1 kg (08/22 0500)  Filed Weights   05/08/24 0606 05/09/24 0500  Weight: 75.1 kg 76.1 kg    Weight change: 1.03 kg   Hemodynamic parameters for last 24 hours: CO:  [3.3 L/min-6.6 L/min] 5.3 L/min CI:  [1.7 L/min/m2-3.5 L/min/m2] 2.8 L/min/m2  Intake/Output from previous day: 08/21 0701 - 08/22 0700 In: 6269.2 [P.O.:960; I.V.:3077.5; IV Piggyback:2231.7] Out: 6354 [Urine:2115; Blood:700; Chest Tube:830]  Intake/Output this shift: Total I/O In: 52 [I.V.:50.7; IV Piggyback:1.3] Out: 100 [Urine:70; Chest Tube:30]  Current Meds: Scheduled Meds:  acetaminophen   1,000 mg Oral Q6H   Or   acetaminophen  (TYLENOL ) oral liquid 160 mg/5 mL  1,000 mg Per Tube Q6H   aspirin  EC  325 mg Oral Daily   Or   aspirin   324 mg Per Tube Daily   atorvastatin   80 mg Oral Daily   bisacodyl   10 mg Oral Daily   Or   bisacodyl   10 mg Rectal Daily    Chlorhexidine  Gluconate Cloth  6 each Topical Daily   Chlorhexidine  Gluconate Cloth  6 each Topical Daily   Chlorhexidine  Gluconate Cloth  6 each Topical Daily   docusate sodium   200 mg Oral Daily   metoCLOPramide  (REGLAN ) injection  10 mg Intravenous Q6H   metoprolol  tartrate  12.5 mg Oral BID   Or   metoprolol  tartrate  12.5 mg Per Tube BID   [START ON 05/10/2024] pantoprazole   40 mg Oral Daily   pantoprazole  (PROTONIX ) IV  40 mg Intravenous QHS   sodium chloride  flush  10-40 mL Intracatheter Q12H   sodium chloride  flush  3 mL Intravenous Q12H   Continuous Infusions:  sodium chloride      sodium chloride      sodium chloride      albumin  human Stopped (05/08/24 1959)    ceFAZolin  (ANCEF ) IV Stopped (05/09/24 0618)   dexmedetomidine  (PRECEDEX ) IV infusion Stopped (05/08/24 1642)   insulin  0.6 Units/hr (05/09/24 0800)   lactated ringers      lactated ringers  20 mL/hr at 05/09/24 0800   nitroGLYCERIN      norepinephrine  (LEVOPHED ) Adult infusion 9 mcg/min (05/09/24 0800)   potassium chloride  50 mL/hr at 05/09/24 0800   PRN Meds:.sodium chloride , albumin  human, dextrose , metoprolol  tartrate, midazolam , morphine  injection, ondansetron  (ZOFRAN ) IV, mouth rinse, oxyCODONE , sodium  chloride flush, sodium chloride  flush, traMADol   General appearance: alert, cooperative, and mild distress Neurologic: intact Heart: RRR, no significant arrhythmias. CI 2.5-2.8  Lungs: breath sounds clear, shallow. CT drainage since surgery but is tapering off. ? Right apical PTX on CXR this morning. There is no air leak from the chest tubes.    Abdomen: soft, no tenderness. Extremities: well perfused, RLE EVH incision is intact and dry Wound: the sternotomy incision is covered with a dry Aquacel dressing.   Lab Results: CBC: Recent Labs    05/08/24 2045 05/09/24 0523  WBC 17.0* 11.6*  HGB 10.3* 10.0*  HCT 29.3* 27.8*  PLT 249 252   BMET:  Recent Labs    05/08/24 2045 05/09/24 0523  NA 133*  130*  K 4.0 3.5  CL 101 100  CO2 22 24  GLUCOSE 135* 114*  BUN 6* 5*  CREATININE 0.65 0.75  CALCIUM  7.5* 7.6*    CMET: Lab Results  Component Value Date   WBC 11.6 (H) 05/09/2024   HGB 10.0 (L) 05/09/2024   HCT 27.8 (L) 05/09/2024   PLT 252 05/09/2024   GLUCOSE 114 (H) 05/09/2024   CHOL 144 10/12/2023   TRIG 52 10/12/2023   HDL 76 10/12/2023   LDLCALC 57 10/12/2023   ALT 15 05/06/2024   AST 20 05/06/2024   NA 130 (L) 05/09/2024   K 3.5 05/09/2024   CL 100 05/09/2024   CREATININE 0.75 05/09/2024   BUN 5 (L) 05/09/2024   CO2 24 05/09/2024   INR 1.3 (H) 05/08/2024   HGBA1C 5.3 05/06/2024      PT/INR:  Recent Labs    05/08/24 1415  LABPROT 17.0*  INR 1.3*   Radiology: [facility] Chest Port 1 View Result Date: 05/08/2024 CLINICAL DATA:  [id number] S/P CABG x 2 [id number] EXAM: PORTABLE CHEST 1 VIEW COMPARISON:  05/06/2024. FINDINGS: Endotracheal tube tip is located 5.8 cm above the carina. Right IJ CVC catheter tip overlies the upper to mid SVC. Postsurgical changes status post CABG and median sternotomy. Mediastinal and left sided chest tubes in place. Streaky opacity in the left mid lung zone could reflect atelectasis or infiltrate. The right lung is clear. No pneumothorax. Possible small left pleural effusion. No acute osseous abnormality. IMPRESSION: 1. Endotracheal tube tip is located 5.8 cm above the carina 2. Right IJ CVC catheter tip overlies the upper to mid SVC. 3. Postsurgical changes status post CABG with mediastinal and left sided chest tubes in place. 4. Streaky opacity in the left mid lung zone could reflect atelectasis or infiltrate. 5. Possible small left pleural effusion. Electronically Signed   By: Harrietta Sherry M.D.   On: 05/08/2024 14:46     Assessment/Plan: S/P Procedure(s) (LRB): OFF PUMP CORONARY ARTERY BYPASS GRAFTING (CABG) TIMES TWO, USING LEFT INTERNAL MAMMARY ARTERY AND RIGHT LEG GREATER SAPHENOUS VEIN HARVESTED ENDOSCOPICALLY (N/A) ECHOCARDIOGRAM,  TRANSESOPHAGEAL (N/A)  -POD1 off-pump CABG x 2, preserved biventricular function. BP supported with NorEpi. On ASA, low-dose metoprolol , and atorvastatin . Mobilizing as able.   -NEURO: intact and pain control adequate.   -PULM- Normal resp effort, on 4LNC with stable sats. CXR not read yet but appears to have a small right PTX.  Work on [employer]. Leaving the chest tubes for now.   -HEME: mild expected acute blood loss anemia. Monitoring.   -RENAL: normal function at baseline. Adequate UO.  Minimal Wt gain as this was an off-pump procedure.   -ENDO: History of pre-diabetes, on no medications prior to admission. CBG's  110-140.  Continue CBG's and transition to SSI.   -GI- tolerating clear liquids without nausea, advance as tolerated.   -DVT PPX- start [place] enoxaparin , mobilize.     Myron G. Roddenberry, PA-C 05/09/2024 8:21 AM   Agree Doing well POD 1 progression  Ulisses Vondrak MALVA Rayas

## 2024-05-09 NOTE — TOC Initial Note (Signed)
 Transition of Care [facility]) - Initial/Assessment Note    Patient Details  Name: Edgar Garcia MRN: [id number] Date of Birth: [birth year]  [facility] CM/SW Contact:    Justina Delcia Czar, RN Phone Number: [phone] 05/09/2024, 3:18 PM  Clinical Narrative:                 Spoke to pt and states wife at home to assist with care. Pt reports being independent pta.  Will need PT/OT recommendations.  Will arrange HH/DME if needed.   Expected Discharge Plan: Home/Self Care Barriers to Discharge: Continued Medical Work up   Patient Goals and CMS Choice Patient states their goals for this hospitalization and ongoing recovery are:: wants to recover completely CMS Medicare.gov Compare Post Acute Care list provided to:: Patient        Expected Discharge Plan and Services   Discharge Planning Services: CM Consult Post Acute Care Choice: Home Health Living arrangements for the past 2 months: Single Family Home                                      Prior Living Arrangements/Services Living arrangements for the past 2 months: Single Family Home Lives with:: Spouse Patient language and need for interpreter reviewed:: Yes Do you feel safe going back to the place where you live?: Yes      Need for Family Participation in Patient Care: No (Comment) Care giver support system in place?: Yes (comment)   Criminal Activity/Legal Involvement Pertinent to Current Situation/Hospitalization: No - Comment as needed  Activities of Daily Living   ADL Screening (condition at time of admission) Independently performs ADLs?: No Does the patient have a NEW difficulty with bathing/dressing/toileting/self-feeding that is expected to last >3 days?: Yes (Initiates electronic notice to provider for possible OT consult) Does the patient have a NEW difficulty with getting in/out of bed, walking, or climbing stairs that is expected to last >3 days?: Yes (Initiates electronic notice to  provider for possible PT consult) Does the patient have a NEW difficulty with communication that is expected to last >3 days?: No Is the patient deaf or have difficulty hearing?: No Does the patient have difficulty seeing, even when wearing glasses/contacts?: No Does the patient have difficulty concentrating, remembering, or making decisions?: No  Permission Sought/Granted Permission sought to share information with : Case Manager, Family Supports, PCP Permission granted to share information with : Yes, Verbal Permission Granted  Share Information with NAME: Arlean Ivans  Permission granted to share info w AGENCY: Home Health, DME, PCP  Permission granted to share info w Relationship: wife  Permission granted to share info w Contact Information: [phone]  Emotional Assessment Appearance:: Appears stated age Attitude/Demeanor/Rapport: Engaged Affect (typically observed): Accepting Orientation: : Oriented to Self, Oriented to Place, Oriented to  Time, Oriented to Situation   Psych Involvement: No (comment)  Admission diagnosis:  Coronary artery disease involving native coronary artery of native heart, unspecified whether angina present [I25.10] S/P CABG x 2 [Z95.1] Patient Active Problem List   Diagnosis Date Noted   S/P CABG x 2 05/08/2024   Coronary artery disease of native artery of native heart with stable angina pectoris (HCC) 02/26/2023   Claudication (HCC) 12/01/2022   Hyponatremia 10/23/2022   Syncope and collapse 10/23/2022   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 08/05/2021   Non-ST elevation (NSTEMI) myocardial infarction (HCC)  Post PTCA 07/26/2021   Abnormal stress test 07/20/2021   Exertional chest pain 06/29/2021   Angina pectoris (HCC) 06/29/2021   Mixed hyperlipidemia 06/29/2021   PVC (premature ventricular contraction) 03/22/2021   Heavy alcohol consumption 04/22/2018   Essential hypertension 04/15/2015   History  of prediabetes 04/15/2015   PCP:  Leila Lucie LABOR, MD Pharmacy:   [facility] [phone] [place], [place] - [address] AT [facility] [address] [place] [address] Phone: [phone] Fax: [phone]  [facility] [address] [place] [address] Phone: [phone] Fax: [phone]     Social Drivers of Health (SDOH) Social History: SDOH Screenings   Tobacco Use: Medium Risk (05/08/2024)   SDOH Interventions:     Readmission Risk Interventions     No data to display

## 2024-05-09 NOTE — Plan of Care (Signed)

## 2024-05-10 ENCOUNTER — Inpatient Hospital Stay (HOSPITAL_COMMUNITY)

## 2024-05-10 LAB — BASIC METABOLIC PANEL WITH GFR
Anion gap: 4 — ABNORMAL LOW (ref 5–15)
BUN: 7 mg/dL — ABNORMAL LOW (ref 8–23)
CO2: 25 mmol/L (ref 22–32)
Calcium: 7.6 mg/dL — ABNORMAL LOW (ref 8.9–10.3)
Chloride: 98 mmol/L (ref 98–111)
Creatinine, Ser: 0.66 mg/dL (ref 0.61–1.24)
GFR, Estimated: >60 mL/min (ref >60)
Glucose, Bld: 88 mg/dL (ref 70–99)
Potassium: 3.6 mmol/L (ref 3.5–5.1)
Sodium: 127 mmol/L — ABNORMAL LOW (ref 135–145)

## 2024-05-10 LAB — LIPID PANEL
Cholesterol: 66 mg/dL (ref 0–200)
HDL: 35 mg/dL — ABNORMAL LOW (ref >40)
LDL Cholesterol: 23 mg/dL (ref 0–99)
Total CHOL/HDL Ratio: 1.9 ratio
Triglycerides: 42 mg/dL (ref <150)
VLDL: 8 mg/dL (ref 0–40)

## 2024-05-10 LAB — CBC
HCT: 24.3 % — ABNORMAL LOW (ref 39.0–52.0)
Hemoglobin: 8.4 g/dL — ABNORMAL LOW (ref 13.0–17.0)
MCH: 35.3 pg — ABNORMAL HIGH (ref 26.0–34.0)
MCHC: 34.6 g/dL (ref 30.0–36.0)
MCV: 102.1 fL — ABNORMAL HIGH (ref 80.0–100.0)
Platelets: 174 K/uL (ref 150–400)
RBC: 2.38 MIL/uL — ABNORMAL LOW (ref 4.22–5.81)
RDW: 11.9 % (ref 11.5–15.5)
WBC: 9 K/uL (ref 4.0–10.5)
nRBC: 0 % (ref 0.0–0.2)

## 2024-05-10 LAB — POCT I-STAT 7, (LYTES, BLD GAS, ICA,H+H)
Acid-base deficit: 1 mmol/L (ref 0.0–2.0)
Acid-base deficit: 5 mmol/L — ABNORMAL HIGH (ref 0.0–2.0)
Acid-base deficit: 3 mmol/L — ABNORMAL HIGH (ref 0.0–2.0)
Bicarbonate: 23.3 mmol/L (ref 20.0–28.0)
Bicarbonate: 20.7 mmol/L (ref 20.0–28.0)
Bicarbonate: 22.1 mmol/L (ref 20.0–28.0)
Calcium, Ion: 1.11 mmol/L — ABNORMAL LOW (ref 1.15–1.40)
Calcium, Ion: 1.05 mmol/L — ABNORMAL LOW (ref 1.15–1.40)
Calcium, Ion: 0.95 mmol/L — ABNORMAL LOW (ref 1.15–1.40)
HCT: 35 % — ABNORMAL LOW (ref 39.0–52.0)
HCT: 32 % — ABNORMAL LOW (ref 39.0–52.0)
HCT: 26 % — ABNORMAL LOW (ref 39.0–52.0)
Hemoglobin: 11.9 g/dL — ABNORMAL LOW (ref 13.0–17.0)
Hemoglobin: 10.9 g/dL — ABNORMAL LOW (ref 13.0–17.0)
Hemoglobin: 8.8 g/dL — ABNORMAL LOW (ref 13.0–17.0)
O2 Saturation: 97 %
O2 Saturation: 99 %
O2 Saturation: 97 %
Patient temperature: 33.2
Patient temperature: 35.3
Patient temperature: 37
Potassium: 3.3 mmol/L — ABNORMAL LOW (ref 3.5–5.1)
Potassium: 3.3 mmol/L — ABNORMAL LOW (ref 3.5–5.1)
Potassium: 3.2 mmol/L — ABNORMAL LOW (ref 3.5–5.1)
Sodium: 134 mmol/L — ABNORMAL LOW (ref 135–145)
Sodium: 136 mmol/L (ref 135–145)
Sodium: 137 mmol/L (ref 135–145)
TCO2: 24 mmol/L (ref 22–32)
TCO2: 22 mmol/L (ref 22–32)
TCO2: 23 mmol/L (ref 22–32)
pCO2 arterial: 32.3 mmHg (ref 32–48)
pCO2 arterial: 37.6 mmHg (ref 32–48)
pCO2 arterial: 39.4 mmHg (ref 32–48)
pH, Arterial: 7.45 (ref 7.35–7.45)
pH, Arterial: 7.34 — ABNORMAL LOW (ref 7.35–7.45)
pH, Arterial: 7.356 (ref 7.35–7.45)
pO2, Arterial: 71 mmHg — ABNORMAL LOW (ref 83–108)
pO2, Arterial: 119 mmHg — ABNORMAL HIGH (ref 83–108)
pO2, Arterial: 93 mmHg (ref 83–108)

## 2024-05-10 LAB — GLUCOSE, CAPILLARY
Glucose-Capillary: 91 mg/dL (ref 70–99)
Glucose-Capillary: 113 mg/dL — ABNORMAL HIGH (ref 70–99)
Glucose-Capillary: 131 mg/dL — ABNORMAL HIGH (ref 70–99)
Glucose-Capillary: 128 mg/dL — ABNORMAL HIGH (ref 70–99)
Glucose-Capillary: 110 mg/dL — ABNORMAL HIGH (ref 70–99)

## 2024-05-10 MED ORDER — SODIUM CHLORIDE 0.9% FLUSH: 3.0000 mL | Freq: Two times a day (BID) | INTRAVENOUS | Status: DC

## 2024-05-10 MED ORDER — INSULIN ASPART 100 UNIT/ML IJ SOLN
0.0000 [IU] | Freq: Three times a day (TID) | INTRAMUSCULAR | Status: DC
  Administered 2024-05-10 (×2): 2 [IU] via SUBCUTANEOUS

## 2024-05-10 MED ORDER — POTASSIUM CHLORIDE 20 MEQ PO PACK
20.0000 meq | PACK | ORAL | Status: DC
  Administered 2024-05-10: 20 meq
  Filled 2024-05-10: qty 1

## 2024-05-10 MED ORDER — SODIUM CHLORIDE 0.9 % IV SOLN: 250.0000 mL | INTRAVENOUS | Status: DC | PRN

## 2024-05-10 MED ORDER — FENTANYL CITRATE PF 50 MCG/ML IJ SOSY: 25.0000 ug | PREFILLED_SYRINGE | Freq: Once | INTRAMUSCULAR | Status: DC

## 2024-05-10 MED ORDER — SODIUM CHLORIDE 0.9% FLUSH: 3.0000 mL | INTRAVENOUS | Status: DC | PRN

## 2024-05-10 MED ORDER — ~~LOC~~ CARDIAC SURGERY, PATIENT & FAMILY EDUCATION
Freq: Once | Status: AC
  Administered 2024-05-10: 1

## 2024-05-10 MED ORDER — MIDODRINE HCL 5 MG PO TABS
5.0000 mg | ORAL_TABLET | Freq: Three times a day (TID) | ORAL | Status: DC
  Administered 2024-05-10 – 2024-05-13 (×10): 5 mg via ORAL
  Filled 2024-05-10 (×10): qty 1

## 2024-05-10 MED ORDER — POTASSIUM CHLORIDE CRYS ER 20 MEQ PO TBCR
40.0000 meq | EXTENDED_RELEASE_TABLET | Freq: Once | ORAL | Status: AC
  Administered 2024-05-10: 40 meq via ORAL
  Filled 2024-05-10: qty 2

## 2024-05-10 NOTE — Plan of Care (Signed)
  Problem: Education: Goal: Knowledge of General Education information will improve Description: Including pain rating scale, medication(s)/side effects and non-pharmacologic comfort measures Outcome: Progressing   Problem: Clinical Measurements: Goal: Ability to maintain clinical measurements within normal limits will improve Outcome: Progressing   Problem: Nutrition: Goal: Adequate nutrition will be maintained Outcome: Progressing   Problem: Activity: Goal: Risk for activity intolerance will decrease Outcome: Progressing   Problem: Pain Managment: Goal: General experience of comfort will improve and/or be controlled Outcome: Progressing   Problem: Safety: Goal: Ability to remain free from injury will improve Outcome: Progressing   Problem: Education: Goal: Will demonstrate proper wound care and an understanding of methods to prevent future damage Outcome: Progressing

## 2024-05-10 NOTE — Progress Notes (Signed)
 NAME:  Edgar Garcia, MRN:  [id number], DOB:  [birth year], LOS: 2 ADMISSION DATE:  05/08/2024, CONSULTATION DATE:  05/08/24 REFERRING MD:  Shyrl , CHIEF COMPLAINT:  s/p CABG    History of Present Illness:  73 yo M hx CAD + prior unsuccessful PCI c/b coronary dissection and brief VF arrest, HTN, HLD presented for planned off pump CABG x 2 05/08/24.  Admitted to ICU post op as per pre op plan. PCCM consulted in this setting  Case unremarkable   EBL: 500 cc Blood product: n/a   Pertinent  Medical History  CAD HTN HLD Prior etoh use   Significant Hospital Events: Including procedures, antibiotic start and stop dates in addition to other pertinent events   8/21 off pump CABG x2 ICU post op   Interim History / Subjective:  No overnight issues Stated surgical site pain is controlled Denies nausea and vomiting  Objective    Blood pressure 112/64, pulse 80, temperature 98.7 F (37.1 C), temperature source Oral, resp. rate 13, height 5' 8.5 (1.74 m), weight 77.1 kg, SpO2 98%.        Intake/Output Summary (Last 24 hours) at 05/10/2024 0846 Last data filed at 05/10/2024 0800 Gross per 24 hour  Intake 1506.74 ml  Output 1067 ml  Net 439.74 ml   Filed Weights   05/08/24 0606 05/09/24 0500 05/10/24 0600  Weight: 75.1 kg 76.1 kg 77.1 kg    Examination: General: Elderly male, sitting on recliner HEENT: [place]/AT, eyes anicteric.  moist mucus membranes Neuro: Alert, awake following commands Chest: Central sternotomy incision looks clean and dry, coarse breath sounds, no wheezes or rhonchi.  Mediastinal, chest tube and pacer wire in place Heart: Regular rate and rhythm, no murmurs or gallops Abdomen: Soft, nontender, nondistended, bowel sounds present  Labs and images reviewed  Patient Lines/Drains/Airways Status     Active Line/Drains/Airways     Name Placement date Placement time Site Days   Peripheral IV 05/08/24 20 G 1 Posterior;Right Hand 05/08/24  1403  Hand  2   Y Chest  Tube 1, 2, and 3 1 Right Pleural 19 Fr. Left Pleural 19 Fr. Medial Mediastinal 19 Fr. 05/08/24  1304  -- 2   Wound 05/08/24 1130 Surgical Closed Surgical Incision Chest Other (Comment) 05/08/24  1130  Chest  2   Wound 05/08/24 1130 Surgical Closed Surgical Incision Leg Right 05/08/24  1130  Leg  2         Resolved problem list  Hypokalemia  Assessment and Plan  Multivessel coronary artery disease s/p off-pump CABG x 2 Continue aspirin  and statin Continue low-dose metoprolol  Chest tube management TCTS Chest tube output was 295 cc in last 24 hours Continue to titrate Precedex  with RASS goal 0/-1 Continue pain control with tramadol , oxycodone  and morphine  Closely monitor chest tube output  Acute respiratory insufficiency, postop Small right-sided postop pneumothorax Encourage incentive spirometry and ambulation Titrate nasal cannula oxygen, currently on 2 L nasal cannula oxygen Repeat x-ray chest showed small persistent apical pneumothorax  Hypertension Continue metoprolol   Hyperlipidemia Continue atorvastatin   Expected perioperative blood loss anemia H&H is slightly trended down, currently at 8.4 Watch for signs of bleeding  Hyponatremia Serum sodium trended down to 127 Closely monitor electrolytes   Labs   CBC: Recent Labs  Lab 05/08/24 1415 05/08/24 1418 05/08/24 1843 05/08/24 2045 05/09/24 0523 05/09/24 1647 05/10/24 0510  WBC 10.7*  --   --  17.0* 11.6* 12.4* 9.0  NEUTROABS  --   --   --  14.9*  --   --   --   HGB 12.3*   < > 8.8* 10.3* 10.0* 9.5* 8.4*  HCT 34.0*   < > 26.0* 29.3* 27.8* 27.6* 24.3*  MCV 97.4  --   --  99.0 98.2 101.8* 102.1*  PLT 235  --   --  249 252 216 174   < > = values in this interval not displayed.    Basic Metabolic Panel: Recent Labs  Lab 05/06/24 1024 05/08/24 1112 05/08/24 1310 05/08/24 1418 05/08/24 1843 05/08/24 2045 05/09/24 0523 05/09/24 1647 05/10/24 0510  NA 130*   < > 136   < > 137 133* 130* 127* 127*  K  3.9   < > 3.4*   < > 3.2* 4.0 3.5 3.9 3.6  CL 96*   < > 100  --   --  101 100 95* 98  CO2 25  --   --   --   --  22 24 25 25   GLUCOSE 111*   < > 120*  --   --  135* 114* 128* 88  BUN 7*   < > 3*  --   --  6* 5* 8 7*  CREATININE 0.83   < > 0.40*  --   --  0.65 0.75 0.68 0.66  CALCIUM  9.1  --   --   --   --  7.5* 7.6* 8.0* 7.6*  MG  --   --   --   --   --  2.6* 2.2 2.2  --    < > = values in this interval not displayed.   GFR: Estimated Creatinine Clearance: 78.5 mL/min (by C-G formula based on SCr of 0.66 mg/dL). Recent Labs  Lab 05/08/24 2045 05/09/24 0523 05/09/24 1647 05/10/24 0510  WBC 17.0* 11.6* 12.4* 9.0    Liver Function Tests: Recent Labs  Lab 05/06/24 1024  AST 20  ALT 15  ALKPHOS 68  BILITOT 1.1  PROT 7.3  ALBUMIN  4.4   No results for input(s): LIPASE, AMYLASE in the last 168 hours. No results for input(s): AMMONIA in the last 168 hours.  ABG    Component Value Date/Time   PHART 7.356 05/08/2024 1843   PCO2ART 39.4 05/08/2024 1843   PO2ART 93 05/08/2024 1843   HCO3 22.1 05/08/2024 1843   TCO2 23 05/08/2024 1843   ACIDBASEDEF 3.0 (H) 05/08/2024 1843   O2SAT 97 05/08/2024 1843     Coagulation Profile: Recent Labs  Lab 05/06/24 1024 05/08/24 1415  INR 1.0 1.3*    Cardiac Enzymes: No results for input(s): CKTOTAL, CKMB, CKMBINDEX, TROPONINI in the last 168 hours.  HbA1C: Hgb A1c MFr Bld  Date/Time Value Ref Range Status  05/06/2024 10:24 AM 5.3 4.8 - 5.6 % Final    Comment:    (NOTE) Diagnosis of Diabetes The following HbA1c ranges recommended by the American Diabetes Association (ADA) may be used as an aid in the diagnosis of diabetes mellitus.  Hemoglobin             Suggested A1C NGSP%              Diagnosis  <5.7                   Non Diabetic  5.7-6.4                Pre-Diabetic  >6.4  Diabetic  <7.0                   Glycemic control for                       adults with diabetes.     07/27/2021 05:12 AM 5.6 4.8 - 5.6 % Final    Comment:    (NOTE) Pre diabetes:          5.7%-6.4%  Diabetes:              >6.4%  Glycemic control for   <7.0% adults with diabetes     CBG: Recent Labs  Lab 05/09/24 1049 05/09/24 1554 05/09/24 2025 05/09/24 2308 05/10/24 0500  GLUCAP 130* 125* 122* 94 91      Valinda Novas, MD [facility] See Amion for pager If no response to pager, please call [phone] until 7pm After 7pm, Please call E-link [phone]

## 2024-05-10 NOTE — Progress Notes (Signed)
      [address]       [place] [address]             [phone]                 2 Days Post-Op Procedure(s) (LRB): OFF PUMP CORONARY ARTERY BYPASS GRAFTING (CABG) TIMES TWO, USING LEFT INTERNAL MAMMARY ARTERY AND RIGHT LEG GREATER SAPHENOUS VEIN HARVESTED ENDOSCOPICALLY (N/A) ECHOCARDIOGRAM, TRANSESOPHAGEAL (N/A)   Events: No events No dizziness today with ambulation _______________________________________________________________ Vitals: BP 113/73   Pulse 79   Temp 98.4 F (36.9 C) (Oral)   Resp (!) 21   Ht 5' 8.5 (1.74 m)   Wt 77.1 kg   SpO2 97%   BMI 25.47 kg/m  Filed Weights   05/08/24 0606 05/09/24 0500 05/10/24 0600  Weight: 75.1 kg 76.1 kg 77.1 kg     - Neuro: alert NAd  - Cardiovascular: sinus  Drips: none.      - Pulm: EWOB    ABG    Component Value Date/Time   PHART 7.356 05/08/2024 1843   PCO2ART 39.4 05/08/2024 1843   PO2ART 93 05/08/2024 1843   HCO3 22.1 05/08/2024 1843   TCO2 23 05/08/2024 1843   ACIDBASEDEF 3.0 (H) 05/08/2024 1843   O2SAT 97 05/08/2024 1843    - Abd: ND - Extremity: warm  .Intake/Output      08/22 0701 08/23 0700 08/23 0701 08/24 0700   P.O. 720 120   I.V. (mL/kg) 260.3 (3.4)    IV Piggyback 458.4    Total Intake(mL/kg) 1438.7 (18.7) 120 (1.6)   Urine (mL/kg/hr) 872 (0.5)    Blood     Chest Tube 295    Total Output 1167    Net +271.7 +120           _______________________________________________________________ Labs:    Latest Ref Rng & Units 05/10/2024    5:10 AM 05/09/2024    4:47 PM 05/09/2024    5:23 AM  CBC  WBC 4.0 - 10.5 K/uL 9.0  12.4  11.6   Hemoglobin 13.0 - 17.0 g/dL 8.4  9.5  89.9   Hematocrit 39.0 - 52.0 % 24.3  27.6  27.8   Platelets 150 - 400 K/uL 174  216  252       Latest Ref Rng & Units 05/10/2024    5:10 AM 05/09/2024    4:47 PM 05/09/2024    5:23 AM  CMP  Glucose 70 - 99 mg/dL 88  871  885   BUN 8 - 23 mg/dL 7  8  5    Creatinine 0.61 - 1.24 mg/dL 9.33   9.31  9.24   Sodium 135 - 145 mmol/L 127  127  130   Potassium 3.5 - 5.1 mmol/L 3.6  3.9  3.5   Chloride 98 - 111 mmol/L 98  95  100   CO2 22 - 32 mmol/L 25  25  24    Calcium  8.9 - 10.3 mg/dL 7.6  8.0  7.6     CXR: stable  _______________________________________________________________  Assessment and Plan: POD 2 s/p CABG  Neuro: pain controlled CV: on A/S/BB.  Adding midodrine  for orthostasis. Pulm: will remove CT Renal: creat stable.  Holding on diuresis for now GI: on diet Heme: stable ID: [email or web address] Endo: SSI Dispo: floor   Edgar Garcia 05/10/2024 9:49 AM

## 2024-05-10 NOTE — Evaluation (Signed)
 Occupational Therapy Evaluation Patient Details Name: Edgar Garcia MRN: [id number] DOB: [birth year] Today's Date: 05/10/2024   History of Present Illness   73 yo M presented 8/21 for off pump CABG x 2 .hx CAD + prior unsuccessful PCI c/b coronary dissection and brief VF arrest, HTN, HLD.     Clinical Impressions Pt ind at baseline with ADLs/functional mobility, lives with spouse who can assist at d/c. Pt currently needing up to min A for ADLs, min-mod A for bed mobility and CGA for transfers without AD. Pt ambulating short distance to bathroom on RA, satting low-mid 90s, O2 left off at end of session with RN aware. Pt provided with sternal prec handout, verbally educated on compensatory strategies for ADLs, pt to ask spouse to bring clothes to practice dressing. Pt presenting with impairments listed below, will follow acutely. Recommend HHOT at d/c.      If plan is discharge home, recommend the following:   A little help with walking and/or transfers;A little help with bathing/dressing/bathroom;Assistance with cooking/housework;Direct supervision/assist for medications management;Direct supervision/assist for financial management;Assist for transportation;Help with stairs or ramp for entrance     Functional Status Assessment   Patient has had a recent decline in their functional status and demonstrates the ability to make significant improvements in function in a reasonable and predictable amount of time.     Equipment Recommendations   Tub/shower seat     Recommendations for Other Services   PT consult     Precautions/Restrictions   Precautions Precautions: Sternal Precaution Booklet Issued: Yes (comment) Recall of Precautions/Restrictions: Intact Precaution/Restrictions Comments: reviewed sternal prec and provided handout     Mobility Bed Mobility Overal bed mobility: Needs Assistance Bed Mobility: Rolling, Sit to Sidelying, Sidelying to Sit Rolling: Min  assist Sidelying to sit: Mod assist     Sit to sidelying: Min assist General bed mobility comments: incr assist for trunk elevation due to urgency/needing to urinate    Transfers Overall transfer level: Needs assistance Equipment used: None Transfers: Sit to/from Stand Sit to Stand: Contact guard assist                  Balance Overall balance assessment: Needs assistance Sitting-balance support: No upper extremity supported, Feet supported Sitting balance-Leahy Scale: Good     Standing balance support: No upper extremity supported Standing balance-Leahy Scale: Fair                             ADL either performed or assessed with clinical judgement   ADL Overall ADL's : Needs assistance/impaired Eating/Feeding: Set up;Sitting   Grooming: Set up;Sitting   Upper Body Bathing: Minimal assistance;Sitting   Lower Body Bathing: Minimal assistance;Sitting/lateral leans   Upper Body Dressing : Minimal assistance;Sitting   Lower Body Dressing: Minimal assistance;Sitting/lateral leans   Toilet Transfer: Minimal assistance;Ambulation;Regular Toilet   Toileting- Clothing Manipulation and Hygiene: Minimal assistance       Functional mobility during ADLs: Minimal assistance       Vision   Vision Assessment?: No apparent visual deficits     Perception Perception: Not tested       Praxis Praxis: Not tested       Pertinent Vitals/Pain Pain Assessment Pain Assessment: Faces Pain Score: 4  Faces Pain Scale: Hurts little more Pain Location: sternum/chest/breathing Pain Descriptors / Indicators: Discomfort Pain Intervention(s): Limited activity within patient's tolerance, Monitored during session, Repositioned     Extremity/Trunk Assessment Upper Extremity Assessment Upper Extremity  Assessment: Generalized weakness (within limits of sternal prec)   Lower Extremity Assessment Lower Extremity Assessment: Defer to PT evaluation   Cervical /  Trunk Assessment Cervical / Trunk Assessment: Normal   Communication Communication Communication: No apparent difficulties   Cognition Arousal: Alert Behavior During Therapy: WFL for tasks assessed/performed Cognition: No apparent impairments                               Following commands: Intact       Cueing  General Comments   Cueing Techniques: Verbal cues  VSS, satting 93% on RA, O2 left off   Exercises     Shoulder Instructions      Home Living Family/patient expects to be discharged to:: Private residence Living Arrangements: Spouse/significant other Available Help at Discharge: Family;Available 24 hours/day Type of Home: House Home Access: Stairs to enter [employer] of Steps: 3 Entrance Stairs-Rails: Right;Left;None Home Layout: One level     Bathroom Shower/Tub: Producer, television/film/video: Handicapped height Bathroom Accessibility: Yes   Home Equipment: None          Prior Functioning/Environment Prior Level of Function : Independent/Modified Independent;Driving;History of Falls (last six months)             Mobility Comments: ind ADLs Comments: 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    OT Problem List: Decreased strength;Decreased range of motion;Decreased activity tolerance;Impaired balance (sitting and/or standing);Decreased knowledge of precautions;Cardiopulmonary status limiting activity   OT Treatment/Interventions: Self-care/ADL training;Therapeutic exercise;DME and/or AE instruction;Energy conservation;Therapeutic activities;Balance training;Patient/family education      OT Goals(Current goals can be found in the care plan section)   Acute Rehab OT Goals Patient Stated Goal: none stated OT Goal Formulation: With patient Time For Goal Achievement: 05/24/24 Potential to Achieve Goals: Good ADL Goals Pt Will Perform Grooming: Independently;standing Pt Will Perform Upper Body Dressing: with  supervision;sitting Pt Will Perform Lower Body Dressing: with supervision;sitting/lateral leans;sit to/from stand Pt Will Transfer to Toilet: Independently;ambulating;regular height toilet Pt Will Perform Tub/Shower Transfer: Shower transfer;Independently;ambulating;shower seat   OT Frequency:  Min 2X/week    Co-evaluation              AM-PAC OT 6 Clicks Daily Activity     Outcome Measure Help from another person eating meals?: A Little Help from another person taking care of personal grooming?: A Little Help from another person toileting, which includes using toliet, bedpan, or urinal?: A Little Help from another person bathing (including washing, rinsing, drying)?: A Little Help from another person to put on and taking off regular upper body clothing?: A Little Help from another person to put on and taking off regular lower body clothing?: A Little 6 Click Score: 18   End of Session Nurse Communication: Mobility status;Other (comment) (O2 left off, pt satting >93)  Activity Tolerance: Patient tolerated treatment well Patient left: in bed;with call bell/phone within reach;with bed alarm set  OT Visit Diagnosis: Unsteadiness on feet (R26.81);Other abnormalities of gait and mobility (R26.89);Muscle weakness (generalized) (M62.81)                Time: 8572-8541 OT Time Calculation (min): 31 min Charges:  OT General Charges $OT Visit: 1 Visit OT Evaluation $OT Eval Low Complexity: 1 Low OT Treatments $Self Care/Home Management : 8-22 mins  Edgar Garcia, OTD, OTR/L [employer] Preferred Acute Rehab [phone]   Edgar Garcia 05/10/2024, 3:28 PM

## 2024-05-10 NOTE — Evaluation (Signed)
 Physical Therapy Evaluation Patient Details Name: Edgar Garcia MRN: [id number] DOB: 1951-05-15 Today's Date: 05/10/2024  History of Present Illness  73 yo M presented 8/21 for off pump CABG x 2 .hx CAD + prior unsuccessful PCI c/b coronary dissection and brief VF arrest, HTN, HLD.  Clinical Impression  Patient is s/p above surgery presenting with functional limitations due to the deficits listed below (see PT Problem List). Previously independent and active but noticing more difficulty and fatigue completing physical activities. 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 Required min assist for transfer and bed mobility, CGA for safety with gait up to 470 feet today. Spo2 93% and greater on room air. HR peak at 104. Reviewed precautions. Good family support available at d/c. Anticipate excellent functional recovery. Patient will benefit from acute skilled PT to increase their independence and safety with mobility to facilitate discharge.         If plan is discharge home, recommend the following: A little help with walking and/or transfers;A little help with bathing/dressing/bathroom;Assistance with cooking/housework;Assist for transportation;Help with stairs or ramp for entrance   Can travel by private vehicle        Equipment Recommendations None recommended by PT (anticipated, vs rollator - will update.)  Recommendations for Other Services       Functional Status Assessment Patient has had a recent decline in their functional status and demonstrates the ability to make significant improvements in function in a reasonable and predictable amount of time.     Precautions / Restrictions Precautions Precautions: Sternal Precaution Booklet Issued:  (Reviewed) Recall of Precautions/Restrictions: Intact Precaution/Restrictions Comments: Sternal Restrictions Weight Bearing Restrictions Per Provider Order: Yes Other Position/Activity Restrictions: Sternal precautions       Mobility  Bed Mobility Overal bed mobility: Needs Assistance Bed Mobility: Rolling, Sit to Sidelying Rolling: Min assist       Sit to sidelying: Min assist, HOB elevated General bed mobility comments: Educated on transition back to bed, min assist for LE support and to roll with light assist for comfort.    Transfers Overall transfer level: Needs assistance Equipment used: Rolling walker (2 wheels) Transfers: Sit to/from Stand Sit to Stand: Min assist           General transfer comment: Light assist for boost to stand. Cues for set-up, able to scoot to edge of recliner without assist. Holding heart pillow for comfort and precaution recall. stabilized after standing with light hand support on RW. mild dizziness, BP stable    Ambulation/Gait Ambulation/Gait assistance: Contact guard assist Gait Distance (Feet): 470 Feet Assistive device: Rolling walker (2 wheels) Gait Pattern/deviations: Step-through pattern, Decreased stride length Gait velocity: dec Gait velocity interpretation: <1.8 ft/sec, indicate of risk for recurrent falls   General Gait Details: Grossly stable with light hand support on RW, no standing rest breaks required. Guarded, slower pace but VSS throughout on Room air with SpO2 93% at nadir.  HR 104. Educated on safety, symptom awareness, pacing, and precautions when using RW.  Stairs            Wheelchair Mobility     Tilt Bed    Modified Rankin (Stroke Patients Only)       Balance Overall balance assessment: Needs assistance Sitting-balance support: No upper extremity supported, Feet supported Sitting balance-Leahy Scale: Good     Standing balance support: No upper extremity supported Standing balance-Leahy Scale: Fair  Pertinent Vitals/Pain Pain Assessment Pain Assessment: Faces Faces Pain Scale: Hurts even more Pain Location: sternum Pain Descriptors / Indicators: Aching Pain  Intervention(s): Monitored during session, Repositioned, Limited activity within patient's tolerance, Utilized relaxation techniques    Home Living Family/patient expects to be discharged to:: Private residence Living Arrangements: Spouse/significant other Available Help at Discharge: Family;Available 24 hours/day Type of Home: House Home Access: Stairs to enter Entrance Stairs-Rails: Right;Left;None (none in back, 2 in front) Entrance Stairs-Number of Steps: 3 (back no rail increased depth, 4 front with bil rails.)   Home Layout: One level Home Equipment: None      Prior Function Prior Level of Function : Independent/Modified Independent;Driving;History of Falls (last six months) (fall from syncope prior to admission`)             Mobility Comments: ind ADLs Comments: 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     Extremity/Trunk Assessment   Upper Extremity Assessment Upper Extremity Assessment: Defer to OT evaluation    Lower Extremity Assessment Lower Extremity Assessment: Generalized weakness       Communication   Communication Communication: No apparent difficulties    Cognition Arousal: Alert Behavior During Therapy: WFL for tasks assessed/performed   PT - Cognitive impairments: No apparent impairments                         Following commands: Intact       Cueing Cueing Techniques: Verbal cues     General Comments General comments (skin integrity, edema, etc.): At rest, HR 91 Spo2 98% on 2L, BP 108/73 sitting. After standing BP 118/73, 3 min standing 132/70. Wife and sister present and supportive    Exercises     Assessment/Plan    PT Assessment Patient needs continued PT services  PT Problem List Decreased strength;Decreased range of motion;Decreased activity tolerance;Decreased balance;Decreased mobility;Decreased knowledge of use of DME;Decreased knowledge of precautions;Cardiopulmonary status limiting activity;Pain       PT  Treatment Interventions DME instruction;Gait training;Stair training;Functional mobility training;Therapeutic activities;Therapeutic exercise;Balance training;Neuromuscular re-education;Patient/family education;Modalities    PT Goals (Current goals can be found in the Care Plan section)  Acute Rehab PT Goals Patient Stated Goal: get well PT Goal Formulation: With patient Time For Goal Achievement: 05/24/24 Potential to Achieve Goals: Good    Frequency Min 2X/week     Co-evaluation               AM-PAC PT 6 Clicks Mobility  Outcome Measure Help needed turning from your back to your side while in a flat bed without using bedrails?: A Little Help needed moving from lying on your back to sitting on the side of a flat bed without using bedrails?: A Little Help needed moving to and from a bed to a chair (including a wheelchair)?: A Little Help needed standing up from a chair using your arms (e.g., wheelchair or bedside chair)?: A Little Help needed to walk in hospital room?: A Little Help needed climbing 3-5 steps with a railing? : A Lot 6 Click Score: 17    End of Session   Activity Tolerance: Patient tolerated treatment well Patient left: in bed;with call bell/phone within reach;with nursing/sitter in room;with family/visitor present Nurse Communication: Mobility status PT Visit Diagnosis: Unsteadiness on feet (R26.81);Other abnormalities of gait and mobility (R26.89);Muscle weakness (generalized) (M62.81);History of falling (Z91.81);Difficulty in walking, not elsewhere classified (R26.2);Pain Pain - part of body:  (sternum post-op)    Time: 8977-8944 PT Time Calculation (min) (ACUTE ONLY):  33 min   Charges:   PT Evaluation $PT Eval Moderate Complexity: 1 Mod PT Treatments $Gait Training: 8-22 mins PT General Charges $$ ACUTE PT VISIT: 1 Visit         Leontine Roads, PT, DPT [facility]  Rehabilitation Services Physical Therapist Office: [phone] Website:  [email or web address]   Leontine GORMAN Roads 05/10/2024, 12:41 PM

## 2024-05-11 LAB — GLUCOSE, CAPILLARY
Glucose-Capillary: 93 mg/dL (ref 70–99)
Glucose-Capillary: 97 mg/dL (ref 70–99)
Glucose-Capillary: 98 mg/dL (ref 70–99)
Glucose-Capillary: 91 mg/dL (ref 70–99)

## 2024-05-11 LAB — CBC
HCT: 24.7 % — ABNORMAL LOW (ref 39.0–52.0)
Hemoglobin: 8.6 g/dL — ABNORMAL LOW (ref 13.0–17.0)
MCH: 35 pg — ABNORMAL HIGH (ref 26.0–34.0)
MCHC: 34.8 g/dL (ref 30.0–36.0)
MCV: 100.4 fL — ABNORMAL HIGH (ref 80.0–100.0)
Platelets: 169 K/uL (ref 150–400)
RBC: 2.46 MIL/uL — ABNORMAL LOW (ref 4.22–5.81)
RDW: 11.8 % (ref 11.5–15.5)
WBC: 7.7 K/uL (ref 4.0–10.5)
nRBC: 0 % (ref 0.0–0.2)

## 2024-05-11 LAB — BASIC METABOLIC PANEL WITH GFR
Anion gap: 7 (ref 5–15)
BUN: 8 mg/dL (ref 8–23)
CO2: 24 mmol/L (ref 22–32)
Calcium: 7.6 mg/dL — ABNORMAL LOW (ref 8.9–10.3)
Chloride: 99 mmol/L (ref 98–111)
Creatinine, Ser: 0.53 mg/dL — ABNORMAL LOW (ref 0.61–1.24)
GFR, Estimated: >60 mL/min (ref >60)
Glucose, Bld: 90 mg/dL (ref 70–99)
Potassium: 4 mmol/L (ref 3.5–5.1)
Sodium: 130 mmol/L — ABNORMAL LOW (ref 135–145)

## 2024-05-11 MED ORDER — AMIODARONE HCL IN DEXTROSE 360-4.14 MG/200ML-% IV SOLN
INTRAVENOUS | Status: AC
  Administered 2024-05-11: 60 mg/h via INTRAVENOUS
  Filled 2024-05-11: qty 200

## 2024-05-11 MED ORDER — POTASSIUM CHLORIDE CRYS ER 20 MEQ PO TBCR
20.0000 meq | EXTENDED_RELEASE_TABLET | Freq: Every day | ORAL | Status: DC
  Administered 2024-05-11: 20 meq via ORAL
  Filled 2024-05-11: qty 1

## 2024-05-11 MED ORDER — AMIODARONE LOAD VIA INFUSION
150.0000 mg | Freq: Once | INTRAVENOUS | Status: AC
  Administered 2024-05-11: 150 mg via INTRAVENOUS
  Filled 2024-05-11: qty 83.34

## 2024-05-11 MED ORDER — AMIODARONE HCL IN DEXTROSE 360-4.14 MG/200ML-% IV SOLN
30.0000 mg/h | INTRAVENOUS | Status: DC
  Administered 2024-05-12 – 2024-05-13 (×3): 30 mg/h via INTRAVENOUS
  Filled 2024-05-11 (×3): qty 200

## 2024-05-11 MED ORDER — POLYETHYLENE GLYCOL 3350 17 G PO PACK
17.0000 g | PACK | Freq: Every day | ORAL | Status: DC
  Administered 2024-05-11 – 2024-05-13 (×3): 17 g via ORAL
  Filled 2024-05-11 (×3): qty 1

## 2024-05-11 MED ORDER — AMIODARONE HCL IN DEXTROSE 360-4.14 MG/200ML-% IV SOLN
60.0000 mg/h | INTRAVENOUS | Status: DC
  Filled 2024-05-11: qty 200

## 2024-05-11 MED ORDER — FUROSEMIDE 40 MG PO TABS
40.0000 mg | ORAL_TABLET | Freq: Every day | ORAL | Status: DC
  Administered 2024-05-11: 40 mg via ORAL
  Filled 2024-05-11: qty 1

## 2024-05-11 NOTE — Progress Notes (Signed)
 Patient converted to afib rvr, asymptomatic, VS stable.    05/11/24 2221  Assess: MEWS Score  BP 132/71  MAP (mmHg) 88  ECG Heart Rate (!) 127  Resp (!) 26  SpO2 97 %  O2 Device Room Air  Assess: MEWS Score  MEWS Temp 0  MEWS Systolic 0  MEWS Pulse 2  MEWS RR 2  MEWS LOC 0  MEWS Score 4  MEWS Score Color Red  Assess: if the MEWS score is Yellow or Red  Were vital signs accurate and taken at a resting state? Yes  Does the patient meet 2 or more of the SIRS criteria? No  MEWS guidelines implemented  Yes, red  Treat  MEWS Interventions Considered administering scheduled or prn medications/treatments as ordered  Take Vital Signs  Increase Vital Sign Frequency  Red: Q1hr x2, continue Q4hrs until patient remains green for 12hrs  Escalate  MEWS: Escalate Red: Discuss with charge nurse and notify provider. Consider notifying RRT. If remains red for 2 hours consider need for higher level of care  Notify: Charge Nurse/RN  Name of Charge Nurse/RN Notified Tim RN  Provider Notification  Provider Name/Title Laurel Berkshire  Date Provider Notified 05/11/24  Time Provider Notified 2210  Method of Notification Page  Notification Reason Change in status  Provider response See new orders  Date of Provider Response 05/11/24  Time of Provider Response 2215  Assess: SIRS CRITERIA  SIRS Temperature  0  SIRS Respirations  1  SIRS Pulse 1  SIRS WBC 0  SIRS Score Sum  2

## 2024-05-11 NOTE — Plan of Care (Signed)

## 2024-05-11 NOTE — Plan of Care (Signed)

## 2024-05-11 NOTE — Progress Notes (Signed)
 Patient placed on amiodorone drip for afib RVR.  Blood pressure has decreased to 89/66 MAP 75.  M. Rodenbarry made aware, verbal order received to continue drip at current rate and monitor MAP, notify if MAP less that 65.  Patient asymptomatic, resting in bed.

## 2024-05-11 NOTE — Progress Notes (Addendum)
         Edgar Garcia CHILD [id number]             [phone]      3 Days Post-Op Procedure(s) (LRB): OFF PUMP CORONARY ARTERY BYPASS GRAFTING (CABG) TIMES TWO, USING LEFT INTERNAL MAMMARY ARTERY AND RIGHT LEG GREATER SAPHENOUS VEIN HARVESTED ENDOSCOPICALLY (N/A) ECHOCARDIOGRAM, TRANSESOPHAGEAL (N/A) Subjective: Transferred from ICU yesterday.  Hasn't walked since arrival to 4E.  Had some nausea this morning without vomiting and denies abd pain. Passing gas but no BM since surgery.   Objective: Vital signs in last 24 hours: Temp:  [98.1 F (36.7 C)-98.7 F (37.1 C)] 98.3 F (36.8 C) (08/24 0757) Pulse Rate:  [76-97] 78 (08/24 0757) Cardiac Rhythm: Normal sinus rhythm (08/23 1900) Resp:  [11-21] 14 (08/24 0757) BP: (108-128)/(68-105) 119/72 (08/24 0757) SpO2:  [92 %-99 %] 93 % (08/24 0300) FiO2 (%):  [40 %] 40 % (08/23 0946) Weight:  [76.9 kg] 76.9 kg (08/24 0300)    Intake/Output from previous day: 08/23 0701 - 08/24 0700 In: 360 [P.O.:360] Out: 1150 [Urine:1150] Intake/Output this shift: No intake/output data recorded.  General appearance: alert, cooperative, and no distress Neurologic: intact Heart: RRR, monitir showing NSR with no significant arrhythmias Lungs: breath sounds clear, shallow Abdomen: soft, no tenderness, active bowel sounds Extremities: well perfused, trace LE edema. The right thigh EVH incision is dry and intact.  Wound: the sternotomy incision is covered with a dry Aquacel dressing.   Lab Results: Recent Labs    05/10/24 0510 05/11/24 0400  WBC 9.0 7.7  HGB 8.4* 8.6*  HCT 24.3* 24.7*  PLT 174 169   BMET:  Recent Labs    05/10/24 0510 05/11/24 0400  NA 127* 130*  K 3.6 4.0  CL 98 99  CO2 25 24  GLUCOSE 88 90  BUN 7* 8  CREATININE 0.66 0.53*  CALCIUM  7.6* 7.6*    PT/INR:  Recent Labs    05/08/24 1415  LABPROT 17.0*  INR 1.3*   ABG    Component Value Date/Time   PHART 7.356 05/08/2024 1843   HCO3 22.1 05/08/2024 1843   TCO2 23  05/08/2024 1843   ACIDBASEDEF 3.0 (H) 05/08/2024 1843   O2SAT 97 05/08/2024 1843   CBG (last 3)  Recent Labs    05/10/24 1621 05/10/24 2108 05/11/24 0646  GLUCAP 128* 110* 93    Assessment/Plan: S/P Procedure(s) (LRB): OFF PUMP CORONARY ARTERY BYPASS GRAFTING (CABG) TIMES TWO, USING LEFT INTERNAL MAMMARY ARTERY AND RIGHT LEG GREATER SAPHENOUS VEIN HARVESTED ENDOSCOPICALLY (N/A) ECHOCARDIOGRAM, TRANSESOPHAGEAL (N/A)  -POD3 Off-pump CABG x 2.  Had a syncopal episode pre-op and BP was marginal early post-op.  On  Midodrine  with MAP in mid 80's now. On ASA, low-dose metoprolol , and atorvastatin .   -Renal: normal function, Wt is ~4lbs+.  Will start once daily oral Lasix  since BP improved.   -PULM- On RA. CXR showing some basilar ATX, working on [employer].   -GI- nausea this morning, abd exam benign. No BM since surgery. Try Miralax  today.  -HEME- expected ABL anemia. Hct stable.   -ENDO: History of pre-diabetes, on no medications prior to admission. CBG's controlled.  Continue CBG's and transition to SSI.   -DVT PPX- on daily enoxaparin , ambulate.    LOS: 3 days    Edgar G. Roddenberry, PA-C 05/11/2024   Agree Doing well Diuresing today Dispo planning  Edgar Garcia

## 2024-05-12 LAB — BASIC METABOLIC PANEL WITH GFR
Anion gap: 8 (ref 5–15)
BUN: 6 mg/dL — ABNORMAL LOW (ref 8–23)
CO2: 25 mmol/L (ref 22–32)
Calcium: 7.9 mg/dL — ABNORMAL LOW (ref 8.9–10.3)
Chloride: 96 mmol/L — ABNORMAL LOW (ref 98–111)
Creatinine, Ser: 0.72 mg/dL (ref 0.61–1.24)
GFR, Estimated: >60 mL/min (ref >60)
Glucose, Bld: 96 mg/dL (ref 70–99)
Potassium: 3.6 mmol/L (ref 3.5–5.1)
Sodium: 129 mmol/L — ABNORMAL LOW (ref 135–145)

## 2024-05-12 LAB — GLUCOSE, CAPILLARY
Glucose-Capillary: 94 mg/dL (ref 70–99)
Glucose-Capillary: 106 mg/dL — ABNORMAL HIGH (ref 70–99)
Glucose-Capillary: 90 mg/dL (ref 70–99)
Glucose-Capillary: 91 mg/dL (ref 70–99)

## 2024-05-12 LAB — MAGNESIUM: Magnesium: 1.7 mg/dL (ref 1.7–2.4)

## 2024-05-12 MED ORDER — DM-GUAIFENESIN ER 30-600 MG PO TB12
1.0000 | ORAL_TABLET | Freq: Two times a day (BID) | ORAL | Status: DC | PRN
  Administered 2024-05-13 (×2): 1 via ORAL
  Filled 2024-05-12 (×2): qty 1

## 2024-05-12 MED ORDER — AMIODARONE IV BOLUS ONLY 150 MG/100ML
150.0000 mg | Freq: Once | INTRAVENOUS | Status: AC
  Administered 2024-05-12: 150 mg via INTRAVENOUS
  Filled 2024-05-12: qty 100

## 2024-05-12 MED ORDER — POTASSIUM CHLORIDE CRYS ER 20 MEQ PO TBCR
40.0000 meq | EXTENDED_RELEASE_TABLET | Freq: Two times a day (BID) | ORAL | Status: AC
  Administered 2024-05-12 (×2): 40 meq via ORAL
  Filled 2024-05-12 (×2): qty 2

## 2024-05-12 MED ORDER — LACTULOSE 10 GM/15ML PO SOLN
20.0000 g | Freq: Two times a day (BID) | ORAL | Status: DC | PRN
  Administered 2024-05-16: 20 g via ORAL
  Filled 2024-05-12: qty 30

## 2024-05-12 MED ORDER — MAGNESIUM OXIDE -MG SUPPLEMENT 400 (240 MG) MG PO TABS
400.0000 mg | ORAL_TABLET | Freq: Two times a day (BID) | ORAL | Status: AC
  Administered 2024-05-12 (×2): 400 mg via ORAL
  Filled 2024-05-12 (×2): qty 1

## 2024-05-12 NOTE — TOC Progression Note (Addendum)
 Transition of Care (TOC) - Progression Note  Rayfield Gobble RN, BSN Inpatient Care Management Unit 4E- RN Case Manager See Treatment Team for direct phone #   Patient Details  Name: Edgar Garcia MRN: [id number] Date of Birth: 01-May-1951  Transition of Care [facility]) CM/SW Contact  Gobble, Rayfield Hurst, RN Phone Number: 05/12/2024, 11:53 AM  Clinical Narrative:    Cm in to speak with patient and wife at bedside. PT also present at bedside getting ready to work with patient.   Discussed plans for discharge. Note recommendations for [facility] PT/OT.  Patient and wife confirm plan to return home with wife. Wife expressed patient's sister is here visiting from out of town and has some concerns with pt's return home-  however with patient's current level of function insurance would not approve going to [facility].  Discussed HH services- pt/wife agreeable to have HH- list provided for review and choice Per CMS guidelines from [email or web address] website with star ratings (copy placed in shadow chart)- CM will follow up on choice and referral needs- orders in for HHPT/OT  Also discussed possible DME needs including RW and shower seat- PT/OT to re-assess DME needs today. CM will follow up after therapies have seen pt.   Wife has questions for cardiac rehab- who will also see pt for education today.   IP CM to follow for HH/DME needs.    Expected Discharge Plan: Home/Self Care Barriers to Discharge: Continued Medical Work up               Expected Discharge Plan and Services   Discharge Planning Services: CM Consult Post Acute Care Choice: Home Health Living arrangements for the past 2 months: Single Family Home                           HH Arranged: PT, OT           Social Drivers of Health (SDOH) Interventions SDOH Screenings   Tobacco Use: Medium Risk (05/08/2024)    Readmission Risk Interventions     No data to display

## 2024-05-12 NOTE — Plan of Care (Signed)

## 2024-05-12 NOTE — Progress Notes (Signed)
 Occupational Therapy Treatment Patient Details Name: Edgar Garcia MRN: [id number] DOB: 1951-02-28 Today's Date: 05/12/2024   History of present illness 73 yo M presented 8/21 for off pump CABG x 2 .hx CAD + prior unsuccessful PCI c/b coronary dissection and brief VF arrest, HTN, HLD.   OT comments  Pt presented with Physical Therapy and pt agreeable with transition to Occupational Therapy session. Pt's IV was leaking and nursing came in session. Pt and wife was educated about how to complete ADLS with the return to home and reviewed precautions. Pt then agreed to complete lateral side steps at EOB to reposition with CGA. At this time recommendation for [facility] therapy.       If plan is discharge home, recommend the following:  A little help with walking and/or transfers;A little help with bathing/dressing/bathroom;Assistance with cooking/housework;Direct supervision/assist for medications management;Direct supervision/assist for financial management;Assist for transportation;Help with stairs or ramp for entrance   Equipment Recommendations  Tub/shower seat    Recommendations for Other Services      Precautions / Restrictions Precautions Precautions: Sternal Precaution Booklet Issued: Yes (comment) Recall of Precautions/Restrictions: Intact Precaution/Restrictions Comments: reviewed sternal prec with pt and wife in session Restrictions Weight Bearing Restrictions Per Provider Order: No RUE Weight Bearing Per Provider Order: Partial weight bearing LUE Weight Bearing Per Provider Order: Partial weight bearing Other Position/Activity Restrictions: Sternal precautions       Mobility Bed Mobility Overal bed mobility: Needs Assistance Bed Mobility: Supine to Sit, Sit to Supine Rolling: Contact guard assist Sidelying to sit: Contact guard assist Supine to sit: Min assist Sit to supine: Contact guard assist   General bed mobility comments: wife present to demonstrate with written  instructions to complete    Transfers Overall transfer level: Needs assistance Equipment used: Rolling walker (2 wheels) Transfers: Sit to/from Stand Sit to Stand: Contact guard assist                 Balance Overall balance assessment: Needs assistance Sitting-balance support: No upper extremity supported, Feet supported Sitting balance-Leahy Scale: Good     Standing balance support: No upper extremity supported, Bilateral upper extremity supported Standing balance-Leahy Scale: Fair Standing balance comment: pt with only side stepping                           ADL either performed or assessed with clinical judgement   ADL Overall ADL's : Needs assistance/impaired Eating/Feeding: Set up;Sitting   Grooming: Set up;Sitting   Upper Body Bathing: Minimal assistance;Sitting   Lower Body Bathing: Minimal assistance;Sitting/lateral leans   Upper Body Dressing : Minimal assistance;Sitting   Lower Body Dressing: Minimal assistance;Sitting/lateral leans   Toilet Transfer: Minimal assistance;Ambulation;Regular Toilet   Toileting- Clothing Manipulation and Hygiene: Minimal assistance       Functional mobility during ADLs: Contact guard assist;Rolling walker (2 wheels);Cueing for sequencing;Cueing for safety      Extremity/Trunk Assessment Upper Extremity Assessment Upper Extremity Assessment: Generalized weakness (sternal precautions)   Lower Extremity Assessment Lower Extremity Assessment: Defer to PT evaluation        Vision   Vision Assessment?: No apparent visual deficits   Perception     Praxis     Communication Communication Communication: No apparent difficulties   Cognition Arousal: Alert Behavior During Therapy: WFL for tasks assessed/performed Cognition: No apparent impairments  Following commands: Intact        Cueing   Cueing Techniques: Verbal cues  Exercises      Shoulder  Instructions       General Comments pt recieved from PT and max HR 140 but now in the 70s    Pertinent Vitals/ Pain       Pain Assessment Pain Assessment: Faces Faces Pain Scale: Hurts a little bit Facial Expression: Relaxed, neutral Body Movements: Absence of movements Muscle Tension: Relaxed Compliance with ventilator (intubated pts.): N/A Vocalization (extubated pts.): N/A CPOT Total: 0 Pain Location: sternum Pain Descriptors / Indicators: Discomfort Pain Intervention(s): Limited activity within patient's tolerance, Monitored during session  Home Living                                          Prior Functioning/Environment              Frequency  Min 2X/week        Progress Toward Goals  OT Goals(current goals can now be found in the care plan section)  Progress towards OT goals: Progressing toward goals  Acute Rehab OT Goals Patient Stated Goal: to get rest OT Goal Formulation: With patient Time For Goal Achievement: 05/24/24 Potential to Achieve Goals: Good ADL Goals Pt Will Perform Grooming: Independently;standing Pt Will Perform Upper Body Dressing: with supervision;sitting Pt Will Perform Lower Body Dressing: with supervision;sitting/lateral leans;sit to/from stand Pt Will Transfer to Toilet: Independently;ambulating;regular height toilet Pt Will Perform Tub/Shower Transfer: Shower transfer;Independently;ambulating;shower seat  Plan      Co-evaluation                 AM-PAC OT 6 Clicks Daily Activity     Outcome Measure   Help from another person eating meals?: A Little Help from another person taking care of personal grooming?: A Little Help from another person toileting, which includes using toliet, bedpan, or urinal?: A Little Help from another person bathing (including washing, rinsing, drying)?: A Little Help from another person to put on and taking off regular upper body clothing?: A Little Help from another  person to put on and taking off regular lower body clothing?: A Little 6 Click Score: 18    End of Session Equipment Utilized During Treatment: Gait belt;Rolling walker (2 wheels)  OT Visit Diagnosis: Unsteadiness on feet (R26.81);Other abnormalities of gait and mobility (R26.89);Muscle weakness (generalized) (M62.81)   Activity Tolerance Patient limited by fatigue   Patient Left in bed;with call bell/phone within reach;with bed alarm set   Nurse Communication Mobility status        Time: 8973-8896 OT Time Calculation (min): 37 min  Charges: OT General Charges $OT Visit: 1 Visit OT Treatments $Self Care/Home Management : 23-37 mins  Warrick POUR OTR/L  Acute Rehab Services  [phone] office number   Warrick Berber 05/12/2024, 11:10 AM

## 2024-05-12 NOTE — Progress Notes (Signed)
 Physical Therapy Treatment Patient Details Name: Edgar Garcia MRN: [id number] DOB: 1951/02/21 Today's Date: 05/12/2024   History of Present Illness 73 yo M presented 8/21 for off pump CABG x 2 .hx CAD + prior unsuccessful PCI c/b coronary dissection and brief VF arrest, HTN, HLD.    PT Comments  Tolerated well today but fatigues a little more quickly that last visit. Regardless, he was able to ambulate >100 feet at a supervision level with very light support from RW while maintaining sternal precautions. Resting heart rate prior to activity 100s-110s. Quickly accelerated with exertion to 130s. After walking a about 90 feet, HR spontaneously dropped to 70s-80s without any symptoms. Patient will continue to benefit from skilled physical therapy services to further improve independence with functional mobility.     If plan is discharge home, recommend the following: A little help with walking and/or transfers;A little help with bathing/dressing/bathroom;Assistance with cooking/housework;Assist for transportation;Help with stairs or ramp for entrance   Can travel by private vehicle        Equipment Recommendations  None recommended by PT    Recommendations for Other Services       Precautions / Restrictions Precautions Precautions: Sternal Precaution Booklet Issued: Yes (comment) Recall of Precautions/Restrictions: Intact Restrictions Other Position/Activity Restrictions: Sternal precautions     Mobility  Bed Mobility Overal bed mobility: Needs Assistance Bed Mobility: Supine to Sit     Supine to sit: Contact guard     General bed mobility comments: CGA to rise to EOB. Cues for precautions, holding heart pillow.    Transfers Overall transfer level: Needs assistance Equipment used: Rolling walker (2 wheels) Transfers: Sit to/from Stand Sit to Stand: Contact guard assist           General transfer comment: CGA for safety to rise, hands placed lightly in lap to rise and  sit. No physical assist required. Light UE placement on RW upon standing for support.    Ambulation/Gait Ambulation/Gait assistance: Supervision Gait Distance (Feet): 140 Feet Assistive device: Rolling walker (2 wheels) Gait Pattern/deviations: Step-through pattern, Decreased stride length Gait velocity: dec Gait velocity interpretation: <1.8 ft/sec, indicate of risk for recurrent falls   General Gait Details: Grossly stable with very light UE support on RW, good posture today, safely maintains precautions. HR peak at 140, avg in 120s-130s througout majority of distance but dropped to 70s-80s towards end of distance. Denies any lightheadedness.   Stairs             Wheelchair Mobility     Tilt Bed    Modified Rankin (Stroke Patients Only)       Balance Overall balance assessment: Needs assistance Sitting-balance support: No upper extremity supported, Feet supported Sitting balance-Leahy Scale: Good     Standing balance support: No upper extremity supported, Bilateral upper extremity supported Standing balance-Leahy Scale: Fair                              Hotel manager: No apparent difficulties  Cognition Arousal: Alert Behavior During Therapy: WFL for tasks assessed/performed   PT - Cognitive impairments: No apparent impairments                         Following commands: Intact      Cueing Cueing Techniques: Verbal cues  Exercises General Exercises - Lower Extremity Ankle Circles/Pumps: AROM, Both, 10 reps, Seated Gluteal Sets: Strengthening, Both, Seated, 5 reps  Long Arc Quad: Strengthening, Both, 5 reps, Seated Hip ABduction/ADduction: Strengthening, Both, 5 reps, Seated    General Comments General comments (skin integrity, edema, etc.): Resting heart rate prior to activity 100s-110s. Quickly accelerated with exertion to 130s. After walking a about 90 feet, HR spontaneously dropped to 70s-80s without  any symptoms.      Pertinent Vitals/Pain Pain Assessment Pain Assessment: Faces Faces Pain Scale: Hurts a little bit Pain Location: sternum Pain Descriptors / Indicators: Discomfort    Home Living                          Prior Function            PT Goals (current goals can now be found in the care plan section) Acute Rehab PT Goals Patient Stated Goal: get well PT Goal Formulation: With patient Time For Goal Achievement: 05/24/24 Potential to Achieve Goals: Good Progress towards PT goals: Progressing toward goals    Frequency    Min 2X/week      PT Plan      Co-evaluation              AM-PAC PT 6 Clicks Mobility   Outcome Measure  Help needed turning from your back to your side while in a flat bed without using bedrails?: A Little Help needed moving from lying on your back to sitting on the side of a flat bed without using bedrails?: A Little Help needed moving to and from a bed to a chair (including a wheelchair)?: A Little Help needed standing up from a chair using your arms (e.g., wheelchair or bedside chair)?: A Little Help needed to walk in hospital room?: A Little Help needed climbing 3-5 steps with a railing? : A Little 6 Click Score: 18    End of Session   Activity Tolerance: Patient tolerated treatment well Patient left: in bed;with call bell/phone within reach;with family/visitor present (OT entering room) Nurse Communication: Mobility status PT Visit Diagnosis: Unsteadiness on feet (R26.81);Other abnormalities of gait and mobility (R26.89);Muscle weakness (generalized) (M62.81);History of falling (Z91.81);Difficulty in walking, not elsewhere classified (R26.2);Pain Pain - part of body:  (sternum post-op)     Time: 8993-8974 PT Time Calculation (min) (ACUTE ONLY): 19 min  Charges:                            Edgar Garcia, PT, DPT [facility]  Rehabilitation Services Physical Therapist Office: [phone] Website:  [email or web address]    Edgar Garcia Garcia 05/12/2024, 11:29 AM

## 2024-05-12 NOTE — Plan of Care (Signed)

## 2024-05-12 NOTE — Progress Notes (Addendum)
 [address] [employer] [address]             [phone]      4 Days Post-Op Procedure(s) (LRB): OFF PUMP CORONARY ARTERY BYPASS GRAFTING (CABG) TIMES TWO, USING LEFT INTERNAL MAMMARY ARTERY AND RIGHT LEG GREATER SAPHENOUS VEIN HARVESTED ENDOSCOPICALLY (N/A) ECHOCARDIOGRAM, TRANSESOPHAGEAL (N/A) Subjective: Patient reports he had a rough night, he reports a coughing fit overnight without sputum production.   Objective: Vital signs in last 24 hours: Temp:  [98.1 F (36.7 C)-98.9 F (37.2 C)] 98.9 F (37.2 C) (08/25 0220) Pulse Rate:  [76-109] 109 (08/25 0220) Cardiac Rhythm: Atrial fibrillation (08/24 2218) Resp:  [11-20] 18 (08/25 0624) BP: (89-132)/(65-72) 106/65 (08/25 0220) SpO2:  [97 %] 97 % (08/24 2221) Weight:  [75.2 kg] 75.2 kg (08/25 0624)  Hemodynamic parameters for last 24 hours:    Intake/Output from previous day: 08/24 0701 - 08/25 0700 In: 480 [P.O.:480] Out: 475 [Urine:475] Intake/Output this shift: No intake/output data recorded.  General appearance: alert, cooperative, and no distress Neurologic: intact Heart: irregularly irregular rhythm Lungs: clear to auscultation bilaterally Abdomen: soft, non-tender; bowel sounds normal; no masses,  no organomegaly Extremities: extremities normal, atraumatic, no cyanosis or edema Wound: Clean and dry without sign of infection  Lab Results: Recent Labs    05/10/24 0510 05/11/24 0400  WBC 9.0 7.7  HGB 8.4* 8.6*  HCT 24.3* 24.7*  PLT 174 169   BMET:  Recent Labs    05/11/24 0400 05/12/24 0342  NA 130* 129*  K 4.0 3.6  CL 99 96*  CO2 24 25  GLUCOSE 90 96  BUN 8 6*  CREATININE 0.53* 0.72  CALCIUM  7.6* 7.9*    PT/INR: No results for input(s): LABPROT, INR in the last 72 hours. ABG    Component Value Date/Time   PHART 7.356 05/08/2024 1843   HCO3 22.1 05/08/2024 1843   TCO2 23 05/08/2024 1843   ACIDBASEDEF 3.0 (H) 05/08/2024 1843   O2SAT 97 05/08/2024 1843    CBG (last 3)  Recent Labs    05/11/24 1619 05/11/24 2110 05/12/24 0619  GLUCAP 98 91 94    Assessment/Plan: S/P Procedure(s) (LRB): OFF PUMP CORONARY ARTERY BYPASS GRAFTING (CABG) TIMES TWO, USING LEFT INTERNAL MAMMARY ARTERY AND RIGHT LEG GREATER SAPHENOUS VEIN HARVESTED ENDOSCOPICALLY (N/A) ECHOCARDIOGRAM, TRANSESOPHAGEAL (N/A)  CV: Postop afib with RVR, on Amiodarone  gtt. Afib with RVR, HR 100-130s this AM. Will give another bolus of Amiodarone  this AM. Patient with syncopal episode preop. Marginal BP, on Midodrine  5mg  TID and Lopressor  12.5mg  BID. SBP 89-112, looks improved this AM. If does not convert to NSR today will need to consider Eliquis .   Pulm: Saturating well on RA. Last CXR with left basilar atelectasis and small right apical pneumothorax. Coughing fit overnight, will add Mucinex  DM PRN. Will get follow up PA/LAT CXR tomorrow. Encourage IS and ambulation.   GI: Patient does not like the food here, overall eats healthy food at home. Recommended family bring healthy foods that he will eat. -BM, passing gas. Suspect he will have a BM when eating more, will add Lactulose  PRN.   Endo: Prediabetes, preop A1C 5.3. No home meds. CBGs controlled on SSI. D/C SSI and CBGs.  Renal: Cr 0.72, stable. UO 475cc/24hrs recorded. On Lasix  40mg  daily, will d/c with marginal BP and at preop weight. K 3.6, supplement. Last Mg 2.2, will check and supplement.   DVT Prophylaxis: Lovenox   Deconditioning: Ambulated more  yesterday. PT/OT recommending HH PT/OT. Continue work with PT/OT  Dispo: Work on heart rate and rhythm control. Dispo planning   LOS: 4 days    Con GORMAN [place], [place] 05/12/2024  Agree Dispo planning  Linnie MALVA Rayas

## 2024-05-12 NOTE — Progress Notes (Signed)
 Came to ambulate earlier however RN was starting amio bolus. He later walked with PT and OT. Resting in bed now. Encouraged pt to walk x2 more today.  Discussed with pt, wife, and sister IS, sternal precautions, diet, exercise, and CRPII. Pt receptive, many questions answered. Will refer to [facility] CRPII.  [id number] Aliene Aris BS, ACSM-CEP 05/12/2024 11:56 AM

## 2024-05-13 ENCOUNTER — Inpatient Hospital Stay (HOSPITAL_COMMUNITY)

## 2024-05-13 LAB — ECHO INTRAOPERATIVE TEE
Height: 68.5 in
Weight: 2648 [oz_av]

## 2024-05-13 LAB — BASIC METABOLIC PANEL WITH GFR
Anion gap: 11 (ref 5–15)
BUN: 8 mg/dL (ref 8–23)
CO2: 23 mmol/L (ref 22–32)
Calcium: 7.9 mg/dL — ABNORMAL LOW (ref 8.9–10.3)
Chloride: 95 mmol/L — ABNORMAL LOW (ref 98–111)
Creatinine, Ser: 0.68 mg/dL (ref 0.61–1.24)
GFR, Estimated: >60 mL/min (ref >60)
Glucose, Bld: 84 mg/dL (ref 70–99)
Potassium: 3.8 mmol/L (ref 3.5–5.1)
Sodium: 129 mmol/L — ABNORMAL LOW (ref 135–145)

## 2024-05-13 MED ORDER — MIDODRINE HCL 5 MG PO TABS: 2.5000 mg | ORAL_TABLET | Freq: Three times a day (TID) | ORAL | Status: DC

## 2024-05-13 MED ORDER — ENSURE PO LIQD: 237.0000 mL | Freq: Three times a day (TID) | ORAL | Status: DC

## 2024-05-13 MED ORDER — BOOST PLUS PO LIQD
237.0000 mL | Freq: Three times a day (TID) | ORAL | Status: DC
  Administered 2024-05-14 – 2024-05-17 (×5): 237 mL via ORAL
  Filled 2024-05-13 (×20): qty 237

## 2024-05-13 MED ORDER — LACTULOSE 10 GM/15ML PO SOLN
20.0000 g | Freq: Every day | ORAL | Status: DC
  Administered 2024-05-13: 20 g via ORAL
  Filled 2024-05-13: qty 30

## 2024-05-13 MED ORDER — POTASSIUM CHLORIDE CRYS ER 20 MEQ PO TBCR
40.0000 meq | EXTENDED_RELEASE_TABLET | Freq: Two times a day (BID) | ORAL | Status: AC
Start: 2024-05-13 — End: 2024-05-13
  Administered 2024-05-13 (×2): 40 meq via ORAL
  Filled 2024-05-13 (×2): qty 2

## 2024-05-13 MED ORDER — AMIODARONE HCL 200 MG PO TABS
400.0000 mg | ORAL_TABLET | Freq: Two times a day (BID) | ORAL | Status: DC
  Administered 2024-05-13 – 2024-05-16 (×7): 400 mg via ORAL
  Filled 2024-05-13 (×7): qty 2

## 2024-05-13 MED ORDER — MAGNESIUM OXIDE -MG SUPPLEMENT 400 (240 MG) MG PO TABS
400.0000 mg | ORAL_TABLET | Freq: Two times a day (BID) | ORAL | Status: AC
  Administered 2024-05-13 (×2): 400 mg via ORAL
  Filled 2024-05-13 (×2): qty 1

## 2024-05-13 MED ORDER — FUROSEMIDE 40 MG PO TABS
40.0000 mg | ORAL_TABLET | Freq: Every day | ORAL | Status: DC
  Administered 2024-05-13: 40 mg via ORAL
  Filled 2024-05-13: qty 1

## 2024-05-13 NOTE — Plan of Care (Signed)

## 2024-05-13 NOTE — Progress Notes (Signed)
 Mobility Specialist Progress Note:   05/13/24 0925  Mobility  Activity Ambulated with assistance  Level of Assistance Standby assist, set-up cues, supervision of patient - no hands on  Assistive Device Front wheel walker  Distance Ambulated (ft) 350 ft  Activity Response Tolerated well  Mobility Referral Yes  Mobility visit 1 Mobility  Mobility Specialist Start Time (ACUTE ONLY) [id number]  Mobility Specialist Stop Time (ACUTE ONLY) 0940  Mobility Specialist Time Calculation (min) (ACUTE ONLY) 15 min   Pt agreeable to mobility session. Required no physical assistance throughout ambulation. Followed sternal precautions well. HR 70-80s SR throughout. Back in bed with all needs met.  Therisa Rana Mobility Specialist Please contact via SecureChat or  Rehab office at [phone]

## 2024-05-13 NOTE — Anesthesia Postprocedure Evaluation (Signed)
 Anesthesia Post Note  Patient: CATON POPOWSKI  Procedure(s) Performed: OFF PUMP CORONARY ARTERY BYPASS GRAFTING (CABG) TIMES TWO, USING LEFT INTERNAL MAMMARY ARTERY AND RIGHT LEG GREATER SAPHENOUS VEIN HARVESTED ENDOSCOPICALLY (Chest) ECHOCARDIOGRAM, TRANSESOPHAGEAL     Patient location during evaluation: SICU Anesthesia Type: General Level of consciousness: sedated Pain management: pain level controlled Vital Signs Assessment: post-procedure vital signs reviewed and stable Respiratory status: patient remains intubated per anesthesia plan Cardiovascular status: stable Postop Assessment: no apparent nausea or vomiting Anesthetic complications: no   No notable events documented.  Last Vitals:  Vitals:   05/13/24 1736 05/13/24 1900  BP: 125/71 137/77  Pulse: 79 (!) 106  Resp: 16 18  Temp: 36.7 C 36.9 C  SpO2: 97% 96%    Last Pain:  Vitals:   05/13/24 1900  TempSrc: Oral  PainSc:                  Kmari Brian S

## 2024-05-13 NOTE — Progress Notes (Addendum)
 [address] [employer] [address]             [phone]      5 Days Post-Op Procedure(s) (LRB): OFF PUMP CORONARY ARTERY BYPASS GRAFTING (CABG) TIMES TWO, USING LEFT INTERNAL MAMMARY ARTERY AND RIGHT LEG GREATER SAPHENOUS VEIN HARVESTED ENDOSCOPICALLY (N/A) ECHOCARDIOGRAM, TRANSESOPHAGEAL (N/A) Subjective: Patient is frustrated that he is not going home today  Objective: Vital signs in last 24 hours: Temp:  [98.3 F (36.8 C)-99.3 F (37.4 C)] 98.8 F (37.1 C) (08/26 0256) Pulse Rate:  [65-101] 65 (08/26 0256) Cardiac Rhythm: Atrial fibrillation (08/25 2059) Resp:  [14-19] 17 (08/26 0256) BP: (112-139)/(62-95) 112/62 (08/26 0256) SpO2:  [94 %-100 %] 94 % (08/26 0256) Weight:  [74.6 kg] 74.6 kg (08/26 0602)  Hemodynamic parameters for last 24 hours:    Intake/Output from previous day: 08/25 0701 - 08/26 0700 In: 240 [P.O.:240] Out: 940 [Urine:940] Intake/Output this shift: No intake/output data recorded.  General appearance: alert, cooperative, and no distress Neurologic: intact Heart: regular rate and rhythm, S1, S2 normal, no murmur, click, rub or gallop Lungs: clear to auscultation bilaterally Abdomen: soft, non-tender; bowel sounds normal; no masses,  no organomegaly Extremities: extremities normal, atraumatic, no cyanosis or edema Wound: Clean and dry without sign of infection  Lab Results: Recent Labs    05/11/24 0400  WBC 7.7  HGB 8.6*  HCT 24.7*  PLT 169   BMET:  Recent Labs    05/12/24 0342 05/13/24 0452  NA 129* 129*  K 3.6 3.8  CL 96* 95*  CO2 25 23  GLUCOSE 96 84  BUN 6* 8  CREATININE 0.72 0.68  CALCIUM  7.9* 7.9*    PT/INR: No results for input(s): LABPROT, INR in the last 72 hours. ABG    Component Value Date/Time   PHART 7.356 05/08/2024 1843   HCO3 22.1 05/08/2024 1843   TCO2 23 05/08/2024 1843   ACIDBASEDEF 3.0 (H) 05/08/2024 1843   O2SAT 97 05/08/2024 1843   CBG (last 3)  Recent Labs     05/12/24 1223 05/12/24 1703 05/12/24 2111  GLUCAP 106* 90 91    Assessment/Plan: S/P Procedure(s) (LRB): OFF PUMP CORONARY ARTERY BYPASS GRAFTING (CABG) TIMES TWO, USING LEFT INTERNAL MAMMARY ARTERY AND RIGHT LEG GREATER SAPHENOUS VEIN HARVESTED ENDOSCOPICALLY (N/A) ECHOCARDIOGRAM, TRANSESOPHAGEAL (N/A)  CV: Postop afib with RVR, on Amiodarone  gtt. NSR, HR 70s this AM. Will transition to PO Amiodarone . Patient with syncopal episode preop. Marginal BP improving, on Midodrine  5mg  TID and Lopressor  12.5mg  BID. SBP 110s-130s. Was in NSR most of the day yesterday and then went back into rate controlled afib for about an hour. If goes back into afib will likely need Eliquis .    Pulm: Saturating well on RA. CXR with left basilar atelectasis and small right apical pneumothorax improved. Encourage IS and ambulation.    GI: Patient does not like the food here, overall eats healthy food at home. Recommended family bring healthy foods that he will eat. Reports 1 episode of vomiting yesterday. -BM, passing gas. Will give lactulose , if cannot tolerate then will give suppository.    Endo: Prediabetes, preop A1C 5.3. No home meds. CBGs controlled on SSI. SSI and CBGs have been d/c'd   Renal: Cr 0.68, stable. UO 940cc/24hrs recorded. Under preop weight, Lasix  has been d/c'd. K 3.8, supplement. Last Mg 1.7, supplement.    DVT Prophylaxis: Lovenox    Deconditioning: Ambulated more yesterday. PT/OT recommending HH PT/OT. Continue  work with PT/OT   Dispo: If remains in NSR hopefully can d/c home tomorrow.    LOS: 5 days    Con GORMAN [place], PA-C 05/13/2024   Agree Back in sinus Pressures improved.  D/c midodrine  Dispo planning  Luara Faye O Lakya Schrupp

## 2024-05-13 NOTE — Progress Notes (Signed)
 CARDIAC REHAB PHASE I   PRE:  Rate/Rhythm: 70 NSR  BP:  Sitting: 137/87      SpO2: 100 RA  MODE:  Ambulation: 240 ft    POST:  Rate/Rhythm: 90  NSR  BP:  Sitting: 150/79      SpO2: 100 RA  Pt agreeable to ambulation. Pt voiced that he wanted to try ambulating in the hallway w/o AD. Pt stood independently and I applied gait belt for safety. Pt walked very well in the hallway w/o AD. Pt was helped back into bed with all needs met.   Edgar STANCO  MS, ACSM-CEP 2:29 PM 05/13/2024

## 2024-05-13 NOTE — TOC Progression Note (Signed)
 Transition of Care (TOC) - Progression Note  Rayfield Gobble RN, BSN Inpatient Care Management Unit 4E- RN Case Manager See Treatment Team for direct phone #   Patient Details  Name: Edgar Garcia MRN: [id number] Date of Birth: [birth year]  Transition of Care [facility]) CM/SW Contact  Gobble Rayfield Hurst, RN Phone Number: 05/13/2024, 2:54 PM  Clinical Narrative:    Follow up done with pt for [facility] choice (wife and sister also present at bedside) Per pt he is agreeable to a few HH visits to follow up in the home post discharge. He voiced that his sister and wife have looked over list and thought [facility] might be a good fit- pt voiced he would like to try [facility] for [facility] needs.  Pt declines any DME needs  Call made to [facility] liaison for [facility] needs- PTOT, referral has been accepted- liaison request RN for post op CABG care. [facility] will follow up for scheduling start of care.   No further IP CM needs at this time.    Expected Discharge Plan: Home/Self Care Barriers to Discharge: Continued Medical Work up               Expected Discharge Plan and Services   Discharge Planning Services: CM Consult Post Acute Care Choice: Home Health Living arrangements for the past 2 months: Single Family Home                 DME Arranged: N/A DME Agency: NA       HH Arranged: RN, PT, OT HH Agency: [facility] Date HH Agency Contacted: 05/13/24 Time HH Agency Contacted: 1454 Representative spoke with at [facility]: Arna   Social Drivers of Health (SDOH) Interventions SDOH Screenings   Tobacco Use: Medium Risk (05/08/2024)    Readmission Risk Interventions    05/13/2024    2:54 PM  Readmission Risk Prevention Plan  Post Dischage Appt Complete  Medication Screening Complete  Transportation Screening Complete

## 2024-05-13 NOTE — Progress Notes (Signed)
      [address] [place] [address]             [phone]        Contacted via nursing around 2137 last evening.  She stated the patient was currently on Amiodarone  drip.  She stated he had maintained NSR most of the day yesterday, however he converted back into A. Fib around 2000 pm last evening.  She administered IV Lopressor  per prn.. HR improved to in the 90s.  She was instructed to continue Amiodarone  drip.  Give evening dose of Lopressor  early.  She was instructed should his HR be 110 or less he would be considered controlled.  However should he develop RVR she would need to call me so we could repeat Amiodarone  bolus.  Rocky Shad, PA-C 7:13 AM 05/13/24

## 2024-05-13 NOTE — Progress Notes (Signed)
 Physical Therapy Treatment Patient Details Name: Edgar Garcia MRN: [id number] DOB: 07-02-1951 Today's Date: 05/13/2024   History of Present Illness 73 yo M presented 8/21 for off pump CABG x 2 .hx CAD + prior unsuccessful PCI c/b coronary dissection and brief VF arrest, HTN, HLD.    PT Comments  Pt received in supine, agreeable to therapy session with good participation, sister present and receptive also to discussion. Pt needing up to modA to perform step up/down 7 platform step in room without UE support and CGA to minA when performing step up/down with single UE support (cues for Move in the Tube precs). Pt performed gait without AD and CGA for safety, no LOB or buckling. Pt agreeable to sit up in chair post-exertion, requesting to keep feet on floor, sister still present in room. Pt continues to benefit from PT services to progress toward functional mobility goals, continue to recommend HHPT.     If plan is discharge home, recommend the following: A little help with walking and/or transfers;A little help with bathing/dressing/bathroom;Assistance with cooking/housework;Assist for transportation;Help with stairs or ramp for entrance   Can travel by private vehicle        Equipment Recommendations  None recommended by PT (could consider RW vs rollator if pt agreeable; pt currently defers)    Recommendations for Other Services       Precautions / Restrictions Precautions Precautions: Sternal Precaution Booklet Issued: Yes (comment) Recall of Precautions/Restrictions: Impaired Precaution/Restrictions Comments: sister present and receptive also to instruction on sternal precs; pt extending bil shoulders to bridge hips in supine prior to sitting up; vcs to stop as this is moving outside the tube. Restrictions Weight Bearing Restrictions Per Provider Order: No Other Position/Activity Restrictions: Sternal precautions     Mobility  Bed Mobility Overal bed mobility: Needs Assistance Bed  Mobility: Rolling, Sidelying to Sit Rolling: Modified independent (Device/Increase time) Sidelying to sit: Supervision       General bed mobility comments: cues for precs; from flat bed wtihout rail; x1 reminder not to extend at bil shoulders to deweight hips    Transfers Overall transfer level: Needs assistance Equipment used: Rolling walker (2 wheels) Transfers: Sit to/from Stand Sit to Stand: Contact guard assist           General transfer comment: Pt appears to use BLE braced against bed frame for improved stability upon standing; fair eccentric control wtih stand>sit without using arms    Ambulation/Gait Ambulation/Gait assistance: Contact guard assist Gait Distance (Feet): 150 Feet Assistive device: None Gait Pattern/deviations: Step-through pattern, Decreased stride length, Narrow base of support Gait velocity: dec     General Gait Details: min cues for wider BOS for improved stability, pt tends to bil hip ER at times. No overt LOB but occasional inconsistent foot placement, but able to maintain stability without physical assist.   Stairs Stairs: Yes Stairs assistance: Min assist, Mod assist Stair Management: No rails, Forwards, Backwards Number of Stairs: 5 General stair comments: single 7.5 step x5 reps without rails per home set-up, pt needing up to minA for stability with stepping up, and up to modA for stepping backward down via gait belt support due to posterior LOB stepping down. Pt leading wtih good leg (non-surgical leg) and stepping down with bad leg. Pt aware of imbalance and given CGA for following step-ups via HHA with improved stability to CGA/minA final 2 steps. Pt has 2 ways to get in the house, PTA emphasized he should use entrance that has rail for  his safety initially upon DC if able.   Wheelchair Mobility     Tilt Bed    Modified Rankin (Stroke Patients Only)       Balance Overall balance assessment: Needs assistance Sitting-balance  support: No upper extremity supported, Feet supported Sitting balance-Leahy Scale: Good     Standing balance support: No upper extremity supported, Bilateral upper extremity supported Standing balance-Leahy Scale: Fair Standing balance comment: Fair for gait; poor with stairs without rails                            Communication Communication Communication: No apparent difficulties  Cognition Arousal: Alert Behavior During Therapy: WFL for tasks assessed/performed   PT - Cognitive impairments: No apparent impairments                       PT - Cognition Comments: x1 cues needed due to supine non-compliance with Move in the Tube precs, otherwise WFL. Decreased insight into imbalance with stairs, but improving awareness after this. Sister present and discussing with him safety plan for stairs when he DC home. Following commands: Intact      Cueing Cueing Techniques: Verbal cues, Visual cues  Exercises      General Comments General comments (skin integrity, edema, etc.): SpO2/HR WFL on RA.      Pertinent Vitals/Pain Pain Assessment Pain Assessment: Faces Faces Pain Scale: Hurts a little bit Pain Location: sternum Pain Descriptors / Indicators: Discomfort Pain Intervention(s): Monitored during session, Repositioned    Home Living                          Prior Function            PT Goals (current goals can now be found in the care plan section) Acute Rehab PT Goals Patient Stated Goal: get well PT Goal Formulation: With patient Time For Goal Achievement: 05/24/24 Progress towards PT goals: Progressing toward goals    Frequency    Min 2X/week      PT Plan      Co-evaluation              AM-PAC PT 6 Clicks Mobility   Outcome Measure  Help needed turning from your back to your side while in a flat bed without using bedrails?: None Help needed moving from lying on your back to sitting on the side of a flat bed without  using bedrails?: A Little Help needed moving to and from a bed to a chair (including a wheelchair)?: A Little Help needed standing up from a chair using your arms (e.g., wheelchair or bedside chair)?: A Little Help needed to walk in hospital room?: A Little Help needed climbing 3-5 steps with a railing? : A Lot 6 Click Score: 18    End of Session Equipment Utilized During Treatment: Gait belt Activity Tolerance: Patient tolerated treatment well Patient left: with call bell/phone within reach;with family/visitor present;in chair (sister in room visiting from MD) Nurse Communication: Mobility status PT Visit Diagnosis: Unsteadiness on feet (R26.81);Other abnormalities of gait and mobility (R26.89);Muscle weakness (generalized) (M62.81);History of falling (Z91.81);Difficulty in walking, not elsewhere classified (R26.2);Pain Pain - part of body:  (sternum post-op)     Time: 8263-8245 PT Time Calculation (min) (ACUTE ONLY): 18 min  Charges:    $Gait Training: 8-22 mins PT General Charges $$ ACUTE PT VISIT: 1 Visit  Connell SQUIBB., PTA Acute Rehabilitation Services Secure Chat Preferred 9a-5:30pm Office: [phone]    Connell HERO [facility] 05/13/2024, 6:30 PM

## 2024-05-14 ENCOUNTER — Telehealth (HOSPITAL_COMMUNITY): Payer: Self-pay | Admitting: Pharmacy Technician

## 2024-05-14 ENCOUNTER — Other Ambulatory Visit (HOSPITAL_COMMUNITY): Payer: Self-pay

## 2024-05-14 LAB — BASIC METABOLIC PANEL WITH GFR
Anion gap: 12 (ref 5–15)
BUN: 6 mg/dL — ABNORMAL LOW (ref 8–23)
CO2: 23 mmol/L (ref 22–32)
Calcium: 7.9 mg/dL — ABNORMAL LOW (ref 8.9–10.3)
Chloride: 93 mmol/L — ABNORMAL LOW (ref 98–111)
Creatinine, Ser: 0.64 mg/dL (ref 0.61–1.24)
GFR, Estimated: >60 mL/min (ref >60)
Glucose, Bld: 99 mg/dL (ref 70–99)
Potassium: 3.8 mmol/L (ref 3.5–5.1)
Sodium: 128 mmol/L — ABNORMAL LOW (ref 135–145)

## 2024-05-14 LAB — TSH: TSH: 2.418 u[IU]/mL (ref 0.350–4.500)

## 2024-05-14 MED ORDER — APIXABAN 5 MG PO TABS
5.0000 mg | ORAL_TABLET | Freq: Two times a day (BID) | ORAL | Status: DC
  Administered 2024-05-14 – 2024-05-19 (×11): 5 mg via ORAL
  Filled 2024-05-14 (×11): qty 1

## 2024-05-14 MED ORDER — POTASSIUM CHLORIDE CRYS ER 20 MEQ PO TBCR
40.0000 meq | EXTENDED_RELEASE_TABLET | Freq: Three times a day (TID) | ORAL | Status: AC
  Administered 2024-05-14 (×3): 40 meq via ORAL
  Filled 2024-05-14 (×3): qty 2

## 2024-05-14 MED ORDER — MIDODRINE HCL 5 MG PO TABS
5.0000 mg | ORAL_TABLET | Freq: Three times a day (TID) | ORAL | Status: DC
  Administered 2024-05-14 – 2024-05-16 (×7): 5 mg via ORAL
  Filled 2024-05-14 (×7): qty 1

## 2024-05-14 MED ORDER — AMIODARONE LOAD VIA INFUSION
150.0000 mg | Freq: Once | INTRAVENOUS | Status: AC
  Administered 2024-05-14: 150 mg via INTRAVENOUS
  Filled 2024-05-14: qty 83.34

## 2024-05-14 MED ORDER — AMIODARONE HCL IN DEXTROSE 360-4.14 MG/200ML-% IV SOLN
60.0000 mg/h | INTRAVENOUS | Status: AC
  Administered 2024-05-14: 60 mg/h via INTRAVENOUS
  Filled 2024-05-14 (×2): qty 200

## 2024-05-14 MED ORDER — ASPIRIN 81 MG PO TBEC
81.0000 mg | DELAYED_RELEASE_TABLET | Freq: Every day | ORAL | Status: DC
  Administered 2024-05-14 – 2024-05-19 (×6): 81 mg via ORAL
  Filled 2024-05-14 (×6): qty 1

## 2024-05-14 MED ORDER — AMIODARONE HCL IN DEXTROSE 360-4.14 MG/200ML-% IV SOLN
30.0000 mg/h | INTRAVENOUS | Status: DC
  Administered 2024-05-14 – 2024-05-15 (×2): 30 mg/h via INTRAVENOUS
  Filled 2024-05-14 (×2): qty 200

## 2024-05-14 MED FILL — Heparin Sodium (Porcine) Inj 1000 Unit/ML: Qty: 1000 | Status: AC

## 2024-05-14 MED FILL — Lidocaine HCl Local Preservative Free (PF) Inj 2%: INTRAMUSCULAR | Qty: 14 | Status: AC

## 2024-05-14 MED FILL — Potassium Chloride Inj 2 mEq/ML: INTRAVENOUS | Qty: 40 | Status: AC

## 2024-05-14 NOTE — Progress Notes (Signed)
 Patient education documents, given to patient on amiodarone  and atrial fibrillation. [place] Virrueta Jessup RN

## 2024-05-14 NOTE — Progress Notes (Signed)
 Physical Therapy Treatment Patient Details Name: Edgar Garcia MRN: [id number] DOB: 23-Jun-1951 Today's Date: 05/14/2024   History of Present Illness 73 yo M presented 8/21 for off pump CABG x 2 . PMH: CAD + prior unsuccessful PCI c/b coronary dissection and brief VF arrest, HTN, HLD.    PT Comments  Pt received in supine, agreeable to therapy session and with good initiation and participation in transfer and gait training with RW. Pt not needing verbal cues for sternal precs this date and good awareness of activity pacing/energy conservation with pt requesting to use RW for safety, pt would continue to benefit from RW upon DC. Pt needing Supervision at most for gait trial with RW support, SpO2/HR [facility] on RA throughout. Pt continues to benefit from PT services to progress toward functional mobility goals, continue to recommend HHPT upon DC.    If plan is discharge home, recommend the following: A little help with walking and/or transfers;A little help with bathing/dressing/bathroom;Assistance with cooking/housework;Assist for transportation;Help with stairs or ramp for entrance   Can travel by private vehicle        Equipment Recommendations  None recommended by PT (RW vs rollator if pt agrees; pt not yet decided)    Recommendations for Other Services       Precautions / Restrictions Precautions Precautions: Sternal Precaution Booklet Issued: Yes (comment) Recall of Precautions/Restrictions: Intact Precaution/Restrictions Comments: compliant this date with precautions Restrictions Weight Bearing Restrictions Per Provider Order: No Other Position/Activity Restrictions: Sternal precautions     Mobility  Bed Mobility Overal bed mobility: Needs Assistance Bed Mobility: Rolling, Sidelying to Sit, Sit to Sidelying Rolling: Modified independent (Device/Increase time) Sidelying to sit: Modified independent (Device/Increase time)     Sit to sidelying: Modified independent (Device/Increase  time) General bed mobility comments: increased effort to perform but no physical assist needed, compliant with move in the tube precs today.    Transfers Overall transfer level: Needs assistance Equipment used: Rolling walker (2 wheels), None Transfers: Sit to/from Stand Sit to Stand: Supervision           General transfer comment: to/from EOB, no UE support until standing, no lift asisst needed today    Ambulation/Gait Ambulation/Gait assistance: Supervision Gait Distance (Feet): 200 Feet Assistive device: Rolling walker (2 wheels) Gait Pattern/deviations: Step-through pattern, WFL(Within Functional Limits)   Gait velocity interpretation: 1.31 - 2.62 ft/sec, indicative of limited community ambulator   General Gait Details: WFL using AD this date; SpO2/HR WFL on RA   Stairs             Wheelchair Mobility     Tilt Bed    Modified Rankin (Stroke Patients Only)       Balance Overall balance assessment: Needs assistance Sitting-balance support: No upper extremity supported, Feet supported Sitting balance-Leahy Scale: Good     Standing balance support: No upper extremity supported, Bilateral upper extremity supported Standing balance-Leahy Scale: Fair Standing balance comment: pt used RW in hallway in this session but would like to progress without, no LOB for short distance at bedside without RW                            Communication Communication Communication: No apparent difficulties  Cognition Arousal: Alert Behavior During Therapy: WFL for tasks assessed/performed   PT - Cognitive impairments: No apparent impairments  PT - Cognition Comments: Spouse present during session. Following commands: Intact      Cueing Cueing Techniques: Verbal cues  Exercises      General Comments General comments (skin integrity, edema, etc.): HR 70's bpm with gait trial SpO2 WFL on RA throughout      Pertinent  Vitals/Pain Pain Assessment Pain Assessment: 0-10 Faces Pain Scale: Hurts little more Pain Location: bil hips during ambulation and posterior neck pain which pt reports is not new Pain Descriptors / Indicators: Discomfort Pain Intervention(s): Limited activity within patient's tolerance, Monitored during session, Repositioned    Home Living                          Prior Function            PT Goals (current goals can now be found in the care plan section) Acute Rehab PT Goals Patient Stated Goal: get well PT Goal Formulation: With patient Time For Goal Achievement: 05/24/24 Progress towards PT goals: Progressing toward goals    Frequency    Min 2X/week      PT Plan      Co-evaluation              AM-PAC PT 6 Clicks Mobility   Outcome Measure  Help needed turning from your back to your side while in a flat bed without using bedrails?: None Help needed moving from lying on your back to sitting on the side of a flat bed without using bedrails?: None Help needed moving to and from a bed to a chair (including a wheelchair)?: A Little Help needed standing up from a chair using your arms (e.g., wheelchair or bedside chair)?: A Little Help needed to walk in hospital room?: A Little Help needed climbing 3-5 steps with a railing? : A Little 6 Click Score: 20    End of Session Equipment Utilized During Treatment: Gait belt Activity Tolerance: Patient tolerated treatment well Patient left: in bed;with call bell/phone within reach;with bed alarm set;Other (comment);with family/visitor present (pt requesting HOB >40 deg; spouse in room) Nurse Communication: Mobility status PT Visit Diagnosis: Unsteadiness on feet (R26.81);Other abnormalities of gait and mobility (R26.89);Muscle weakness (generalized) (M62.81);History of falling (Z91.81);Difficulty in walking, not elsewhere classified (R26.2);Pain Pain - part of body:  (bil hips)     Time: 8340-8288 PT Time  Calculation (min) (ACUTE ONLY): 12 min  Charges:    $Gait Training: 8-22 mins PT General Charges $$ ACUTE PT VISIT: 1 Visit                     Arlayne Liggins P., PTA Acute Rehabilitation Services Secure Chat Preferred 9a-5:30pm Office: [phone]    Connell CHRISTELLA Blue 05/14/2024, 5:36 PM

## 2024-05-14 NOTE — Progress Notes (Signed)
 PT Cancellation Note  Patient Details Name: Edgar Garcia MRN: [id number] DOB: 1950/09/29   Cancelled Treatment:    Reason Eval/Treat Not Completed: (P) Patient declined, no reason specified (pt reports mild lightheadedness recently (toward end of session with OT) and fatigue after recent ambulation in hallway.) Will continue efforts later in day after lunch per PT plan of care as schedule permits.   Connell HERO Andree Golphin 05/14/2024, 10:52 AM

## 2024-05-14 NOTE — Telephone Encounter (Signed)
 Pharmacy Patient Advocate Encounter  Insurance verification completed.    The patient is insured through [employer]. Patient has Medicare and is not eligible for a copay card, but may be able to apply for patient assistance or Medicare RX Payment Plan (Patient Must reach out to their plan, if eligible for payment plan), if available.    Ran test claim for Eliquis  5mg  tablets and the current 30 day co-pay is $288.31.  Copay applied to and meets deductible. $47.00 after the deductible.   This test claim was processed through [facility]- copay amounts may vary at other pharmacies due to pharmacy/plan contracts, or as the patient moves through the different stages of their insurance plan.

## 2024-05-14 NOTE — Progress Notes (Addendum)
 [address] [place] [address]             [phone]      6 Days Post-Op Procedure(s) (LRB): OFF PUMP CORONARY ARTERY BYPASS GRAFTING (CABG) TIMES TWO, USING LEFT INTERNAL MAMMARY ARTERY AND RIGHT LEG GREATER SAPHENOUS VEIN HARVESTED ENDOSCOPICALLY (N/A) ECHOCARDIOGRAM, TRANSESOPHAGEAL (N/A) Subjective: Patient reports he did not get much sleep last night  Objective: Vital signs in last 24 hours: Temp:  [97.9 F (36.6 C)-98.7 F (37.1 C)] 98.4 F (36.9 C) (08/27 0300) Pulse Rate:  [72-125] 118 (08/26 2321) Cardiac Rhythm: Atrial fibrillation (08/27 0645) Resp:  [13-18] 17 (08/26 2321) BP: (97-152)/(70-85) 97/70 (08/27 0300) SpO2:  [93 %-100 %] 93 % (08/26 2321) Weight:  [73.6 kg] 73.6 kg (08/27 0641)  Hemodynamic parameters for last 24 hours:    Intake/Output from previous day: 08/26 0701 - 08/27 0700 In: 230 [P.O.:220; I.V.:10] Out: 1680 [Urine:1680] Intake/Output this shift: No intake/output data recorded.  General appearance: alert, cooperative, and no distress Neurologic: intact Heart: irregularly irregular rhythm Lungs: clear to auscultation bilaterally Abdomen: soft, non-tender; bowel sounds normal; no masses,  no organomegaly Extremities: extremities normal, atraumatic, no cyanosis or edema Wound: Clean and dry without sign of infection  Lab Results: No results for input(s): WBC, HGB, HCT, PLT in the last 72 hours. BMET:  Recent Labs    05/12/24 0342 05/13/24 0452  NA 129* 129*  K 3.6 3.8  CL 96* 95*  CO2 25 23  GLUCOSE 96 84  BUN 6* 8  CREATININE 0.72 0.68  CALCIUM  7.9* 7.9*    PT/INR: No results for input(s): LABPROT, INR in the last 72 hours. ABG    Component Value Date/Time   PHART 7.356 05/08/2024 1843   HCO3 22.1 05/08/2024 1843   TCO2 23 05/08/2024 1843   ACIDBASEDEF 3.0 (H) 05/08/2024 1843   O2SAT 97 05/08/2024 1843   CBG (last 3)  Recent Labs    05/12/24 1223 05/12/24 1703  05/12/24 2111  GLUCAP 106* 90 91    Assessment/Plan: S/P Procedure(s) (LRB): OFF PUMP CORONARY ARTERY BYPASS GRAFTING (CABG) TIMES TWO, USING LEFT INTERNAL MAMMARY ARTERY AND RIGHT LEG GREATER SAPHENOUS VEIN HARVESTED ENDOSCOPICALLY (N/A) ECHOCARDIOGRAM, TRANSESOPHAGEAL (N/A)  CV: Postop afib with RVR, on PO Amiodarone . Went into Afib with RVR around 2300 yesterday and RN paged PA to the wrong number. No PA or surgeon was ever reached and no orders were placed. Will restart Amiodarone  protocol this AM. Will start Eliquis  for stroke prophylaxis. Patient with syncopal episode preop. Marginal BP improved, SBP up to 140s-150s yesterday Midodrine  was discontinued as discussed with Dr. Shyrl, on Lopressor  12.5mg  BID. Patient would benefit from Lopressor  titration but BP likely would not tolerate now that he is off Midodrine . SBP this AM 97-110. As discussed with Dr. Shyrl will restart Midodrine  5mg  TID since he is back in afib and we are restarting IV Amiodarone .    Pulm: Saturating well on RA. CXR with left basilar atelectasis, small bilateral pleural effusions and small right apical pneumothorax improved. Encourage IS and ambulation. Gave 1 dose of Lasix  yesterday for pleural effusions.   GI: +BM. Patient does not like the food here but is overall tolerating a diet. No further nausea/vomiting.    Endo: Prediabetes, preop A1C 5.3. No home meds. CBGs controlled on SSI. SSI and CBGs have been d/c'd   Renal: Yesterday's Cr 0.68, stable. UO 1680cc/24hrs recorded. Under preop weight, on Lasix  for pleural  effusions will hold due to soft BP this AM. Yesterday's K 3.8, supplement. BMET this AM pending   DVT Prophylaxis: Lovenox    Deconditioning: Ambulated well. PT/OT recommending HH PT/OT. Continue work with PT/OT   Dispo: Back in afib with RVR will restart Amio gtt and Eliquis . Work on heart rate and rhythm control prior to discharge     LOS: 6 days    Con GORMAN [place],  PA-C 05/14/2024

## 2024-05-14 NOTE — Progress Notes (Signed)
   05/14/24 1419  Vitals  BP 117/85  MAP (mmHg) 91  BP Location Right Arm  BP Method Automatic  Patient Position (if appropriate) Sitting  ECG Heart Rate (!) 122  Resp 18  MEWS COLOR  MEWS Score Color Yellow  Oxygen Therapy  SpO2 98 %  O2 Device Room Air  MEWS Score  MEWS Temp 0  MEWS Systolic 0  MEWS Pulse 2  MEWS RR 0  MEWS LOC 0  MEWS Score 2   Patient ambulating in hallway, approximatley 320 feet with nursing staff patient started walk with NSR rate 70 and as we were entering room back , patient HR returned to atrial fib rate 140s, now sitting back in chair HR ranging from 118-121, patient stated he was not short of breath but did feel off when HR went back into irregular rhythm.  Con [facility] made aware and orders received. Bed side RN and charge RN aware. Call bell within reach. Family at bedside. Reyaansh Merlo Jessup RN

## 2024-05-14 NOTE — Plan of Care (Signed)
  Problem: Education: Goal: Knowledge of General Education information will improve Description: Including pain rating scale, medication(s)/side effects and non-pharmacologic comfort measures Outcome: Progressing   Problem: Clinical Measurements: Goal: Ability to maintain clinical measurements within normal limits will improve Outcome: Progressing Goal: Will remain free from infection Outcome: Progressing Goal: Diagnostic test results will improve Outcome: Progressing Goal: Respiratory complications will improve Outcome: Progressing Goal: Cardiovascular complication will be avoided Outcome: Progressing   Problem: Activity: Goal: Risk for activity intolerance will decrease Outcome: Progressing   Problem: Nutrition: Goal: Adequate nutrition will be maintained Outcome: Progressing   Problem: Coping: Goal: Level of anxiety will decrease Outcome: Progressing   Problem: Elimination: Goal: Will not experience complications related to bowel motility Outcome: Progressing Goal: Will not experience complications related to urinary retention Outcome: Progressing   Problem: Pain Managment: Goal: General experience of comfort will improve and/or be controlled Outcome: Progressing   Problem: Safety: Goal: Ability to remain free from injury will improve Outcome: Progressing   Problem: Skin Integrity: Goal: Risk for impaired skin integrity will decrease Outcome: Progressing   Problem: Education: Goal: Will demonstrate proper wound care and an understanding of methods to prevent future damage Outcome: Progressing Goal: Knowledge of disease or condition will improve Outcome: Progressing Goal: Knowledge of the prescribed therapeutic regimen will improve Outcome: Progressing   Problem: Activity: Goal: Risk for activity intolerance will decrease Outcome: Progressing   Problem: Cardiac: Goal: Will achieve and/or maintain hemodynamic stability Outcome: Progressing   Problem:  Clinical Measurements: Goal: Postoperative complications will be avoided or minimized Outcome: Progressing   Problem: Respiratory: Goal: Respiratory status will improve Outcome: Progressing   Problem: Skin Integrity: Goal: Wound healing without signs and symptoms of infection Outcome: Progressing Goal: Risk for impaired skin integrity will decrease Outcome: Progressing   Problem: Urinary Elimination: Goal: Ability to achieve and maintain adequate renal perfusion and functioning will improve Outcome: Progressing

## 2024-05-14 NOTE — Progress Notes (Signed)
 Occupational Therapy Treatment Patient Details Name: Edgar Garcia MRN: [id number] DOB: Dec 03, 1950 Today's Date: 05/14/2024   History of present illness 73 yo M presented 8/21 for off pump CABG x 2 .hx CAD + prior unsuccessful PCI c/b coronary dissection and brief VF arrest, HTN, HLD.   OT comments  Pt presented with wife in the room and wanted to work on ambulation at this time. Pt was able to ambulate with CGA and RW but wants to progress to no devices. Pt mostly was able to remain in the 80s but after a coughing HR went up to 157 and then went back down to the 80s. Then returning to bed pt started to feel dizzy with BP 113/67 (81) but once placed into supine reported the dizziness stopped. Pt decline any ADL care at this time. At this time recommendation for [facility].       If plan is discharge home, recommend the following:  A little help with walking and/or transfers;A little help with bathing/dressing/bathroom;Assistance with cooking/housework;Direct supervision/assist for medications management;Direct supervision/assist for financial management;Assist for transportation;Help with stairs or ramp for entrance   Equipment Recommendations  Tub/shower seat    Recommendations for Other Services      Precautions / Restrictions Precautions Precautions: Sternal Precaution Booklet Issued: Yes (comment) Recall of Precautions/Restrictions: Impaired Precaution/Restrictions Comments: pt able to demonstrate in session sternal precautions Restrictions Weight Bearing Restrictions Per Provider Order: No RUE Weight Bearing Per Provider Order: Weight bearing as tolerated LUE Weight Bearing Per Provider Order: Weight bearing as tolerated Other Position/Activity Restrictions: Sternal precautions       Mobility Bed Mobility Overal bed mobility: Needs Assistance Bed Mobility: Supine to Sit, Sit to Supine     Supine to sit: Min assist Sit to supine: Contact guard assist        Transfers Overall  transfer level: Needs assistance Equipment used: Rolling walker (2 wheels) Transfers: Sit to/from Stand Sit to Stand: Contact guard assist                 Balance Overall balance assessment: Needs assistance Sitting-balance support: No upper extremity supported, Feet supported Sitting balance-Leahy Scale: Good     Standing balance support: No upper extremity supported, Bilateral upper extremity supported Standing balance-Leahy Scale: Fair Standing balance comment: pt used RW in this session but would like to progress without                           ADL either performed or assessed with clinical judgement   ADL Overall ADL's : Needs assistance/impaired Eating/Feeding: Sitting;Independent   Grooming: Set up;Sitting       Lower Body Bathing: Contact guard assist;Sit to/from stand       Lower Body Dressing: Contact guard assist;Sit to/from stand               Functional mobility during ADLs: Contact guard assist;Rolling walker (2 wheels)      Extremity/Trunk Assessment Upper Extremity Assessment Upper Extremity Assessment: Generalized weakness   Lower Extremity Assessment Lower Extremity Assessment: Defer to PT evaluation        Vision   Vision Assessment?: No apparent visual deficits   Perception Perception Perception: Not tested   Praxis Praxis Praxis: Not tested   Communication Communication Communication: No apparent difficulties   Cognition Arousal: Alert Behavior During Therapy: WFL for tasks assessed/performed Cognition: No apparent impairments  Following commands: Intact        Cueing   Cueing Techniques: Verbal cues, Visual cues  Exercises      Shoulder Instructions       General Comments      Pertinent Vitals/ Pain       Pain Assessment Pain Assessment: Faces Faces Pain Scale: Hurts a little bit Facial Expression: Relaxed, neutral Body Movements: Absence of  movements Muscle Tension: Relaxed Compliance with ventilator (intubated pts.): N/A Vocalization (extubated pts.): N/A CPOT Total: 0 Pain Location: sternum Pain Descriptors / Indicators: Discomfort Pain Intervention(s): Monitored during session  Home Living                                          Prior Functioning/Environment              Frequency  Min 2X/week        Progress Toward Goals  OT Goals(current goals can now be found in the care plan section)  Progress towards OT goals: Progressing toward goals  Acute Rehab OT Goals Patient Stated Goal: to go home OT Goal Formulation: With patient Time For Goal Achievement: 05/24/24 Potential to Achieve Goals: Good ADL Goals Pt Will Perform Grooming: Independently;standing Pt Will Perform Upper Body Dressing: with supervision;sitting Pt Will Perform Lower Body Dressing: with supervision;sitting/lateral leans;sit to/from stand Pt Will Transfer to Toilet: Independently;ambulating;regular height toilet Pt Will Perform Tub/Shower Transfer: Shower transfer;Independently;ambulating;shower seat  Plan      Co-evaluation                 AM-PAC OT 6 Clicks Daily Activity     Outcome Measure   Help from another person eating meals?: A Little Help from another person taking care of personal grooming?: A Little Help from another person toileting, which includes using toliet, bedpan, or urinal?: A Little Help from another person bathing (including washing, rinsing, drying)?: A Little Help from another person to put on and taking off regular upper body clothing?: A Little Help from another person to put on and taking off regular lower body clothing?: A Little 6 Click Score: 18    End of Session Equipment Utilized During Treatment: Gait belt;Rolling walker (2 wheels)  OT Visit Diagnosis: Unsteadiness on feet (R26.81);Other abnormalities of gait and mobility (R26.89);Muscle weakness (generalized)  (M62.81)   Activity Tolerance Patient tolerated treatment well   Patient Left in bed;with call bell/phone within reach   Nurse Communication Mobility status        Time: 9065-8998 OT Time Calculation (min): 27 min  Charges: OT General Charges $OT Visit: 1 Visit OT Treatments $Self Care/Home Management : 23-37 mins  Edgar Garcia  Acute Rehab Services  [phone] office number   Edgar Berber 05/14/2024, 10:18 AM

## 2024-05-14 NOTE — Progress Notes (Signed)
 Pt's IV on left forearm was infiltrated. Amiodarone  was stopped. There was non-pitting edema around IV site with no redness or warm to touch. Pt denied pain to IV site. Pharmacist notified and recommended warm compress and elevated left arm. Will continue to monitor.

## 2024-05-15 LAB — BASIC METABOLIC PANEL WITH GFR
Anion gap: 12 (ref 5–15)
BUN: 6 mg/dL — ABNORMAL LOW (ref 8–23)
CO2: 22 mmol/L (ref 22–32)
Calcium: 8.7 mg/dL — ABNORMAL LOW (ref 8.9–10.3)
Chloride: 93 mmol/L — ABNORMAL LOW (ref 98–111)
Creatinine, Ser: 0.71 mg/dL (ref 0.61–1.24)
GFR, Estimated: >60 mL/min (ref >60)
Glucose, Bld: 152 mg/dL — ABNORMAL HIGH (ref 70–99)
Potassium: 4.1 mmol/L (ref 3.5–5.1)
Sodium: 127 mmol/L — ABNORMAL LOW (ref 135–145)

## 2024-05-15 MED ORDER — POTASSIUM CHLORIDE CRYS ER 20 MEQ PO TBCR
40.0000 meq | EXTENDED_RELEASE_TABLET | Freq: Two times a day (BID) | ORAL | Status: AC
  Administered 2024-05-15: 40 meq via ORAL
  Filled 2024-05-15: qty 2

## 2024-05-15 MED ORDER — AMIODARONE IV BOLUS ONLY 150 MG/100ML
150.0000 mg | Freq: Once | INTRAVENOUS | Status: AC
  Administered 2024-05-15: 150 mg via INTRAVENOUS
  Filled 2024-05-15: qty 100

## 2024-05-15 MED ORDER — POTASSIUM CHLORIDE CRYS ER 20 MEQ PO TBCR: 40.0000 meq | EXTENDED_RELEASE_TABLET | Freq: Once | ORAL | Status: DC

## 2024-05-15 MED FILL — Sodium Chloride IV Soln 0.9%: INTRAVENOUS | Qty: 3000 | Status: AC

## 2024-05-15 MED FILL — Heparin Sodium (Porcine) Inj 1000 Unit/ML: INTRAMUSCULAR | Qty: 30 | Status: AC

## 2024-05-15 NOTE — Plan of Care (Signed)
  Problem: Education: Goal: Knowledge of General Education information will improve Description: Including pain rating scale, medication(s)/side effects and non-pharmacologic comfort measures Outcome: Progressing   Problem: Clinical Measurements: Goal: Ability to maintain clinical measurements within normal limits will improve Outcome: Progressing Goal: Will remain free from infection Outcome: Progressing Goal: Diagnostic test results will improve Outcome: Progressing Goal: Respiratory complications will improve Outcome: Progressing Goal: Cardiovascular complication will be avoided Outcome: Progressing   Problem: Activity: Goal: Risk for activity intolerance will decrease Outcome: Progressing   Problem: Nutrition: Goal: Adequate nutrition will be maintained Outcome: Progressing   Problem: Coping: Goal: Level of anxiety will decrease Outcome: Progressing   Problem: Elimination: Goal: Will not experience complications related to bowel motility Outcome: Progressing Goal: Will not experience complications related to urinary retention Outcome: Progressing   Problem: Pain Managment: Goal: General experience of comfort will improve and/or be controlled Outcome: Progressing   Problem: Safety: Goal: Ability to remain free from injury will improve Outcome: Progressing   Problem: Skin Integrity: Goal: Risk for impaired skin integrity will decrease Outcome: Progressing   Problem: Education: Goal: Will demonstrate proper wound care and an understanding of methods to prevent future damage Outcome: Progressing Goal: Knowledge of disease or condition will improve Outcome: Progressing Goal: Knowledge of the prescribed therapeutic regimen will improve Outcome: Progressing   Problem: Activity: Goal: Risk for activity intolerance will decrease Outcome: Progressing   Problem: Cardiac: Goal: Will achieve and/or maintain hemodynamic stability Outcome: Progressing   Problem:  Clinical Measurements: Goal: Postoperative complications will be avoided or minimized Outcome: Progressing   Problem: Respiratory: Goal: Respiratory status will improve Outcome: Progressing   Problem: Skin Integrity: Goal: Wound healing without signs and symptoms of infection Outcome: Progressing Goal: Risk for impaired skin integrity will decrease Outcome: Progressing   Problem: Urinary Elimination: Goal: Ability to achieve and maintain adequate renal perfusion and functioning will improve Outcome: Progressing

## 2024-05-15 NOTE — Progress Notes (Addendum)
 [address] [place] [place] [address]             [phone]      7 Days Post-Op Procedure(s) (LRB): OFF PUMP CORONARY ARTERY BYPASS GRAFTING (CABG) TIMES TWO, USING LEFT INTERNAL MAMMARY ARTERY AND RIGHT LEG GREATER SAPHENOUS VEIN HARVESTED ENDOSCOPICALLY (N/A) ECHOCARDIOGRAM, TRANSESOPHAGEAL (N/A) Subjective: Patient reports he is feeling the best he has.  Objective: Vital signs in last 24 hours: Temp:  [97.7 F (36.5 C)-98.7 F (37.1 C)] 98.7 F (37.1 C) (08/28 0307) Pulse Rate:  [65-119] 66 (08/28 0307) Cardiac Rhythm: Normal sinus rhythm (08/27 1957) Resp:  [15-20] 20 (08/28 0307) BP: (101-140)/(68-99) 114/99 (08/28 0307) SpO2:  [95 %-100 %] 98 % (08/28 0307) Weight:  [74.9 kg] 74.9 kg (08/28 0307)  Hemodynamic parameters for last 24 hours:    Intake/Output from previous day: 08/27 0701 - 08/28 0700 In: 360 [P.O.:360] Out: 1250 [Urine:1250] Intake/Output this shift: No intake/output data recorded.  General appearance: alert, cooperative, and no distress Neurologic: intact Heart: regular rate and rhythm, S1, S2 normal, no murmur, click, rub or gallop Lungs: clear to auscultation bilaterally Abdomen: soft, non-tender; bowel sounds normal; no masses,  no organomegaly Extremities: extremities normal, atraumatic, no cyanosis or edema Wound: Clean and dry without sign of infection  Lab Results: No results for input(s): WBC, HGB, HCT, PLT in the last 72 hours. BMET:  Recent Labs    05/13/24 0452 05/14/24 0637  NA 129* 128*  K 3.8 3.8  CL 95* 93*  CO2 23 23  GLUCOSE 84 99  BUN 8 6*  CREATININE 0.68 0.64  CALCIUM  7.9* 7.9*    PT/INR: No results for input(s): LABPROT, INR in the last 72 hours. ABG    Component Value Date/Time   PHART 7.356 05/08/2024 1843   HCO3 22.1 05/08/2024 1843   TCO2 23 05/08/2024 1843   ACIDBASEDEF 3.0 (H) 05/08/2024 1843   O2SAT 97 05/08/2024 1843   CBG (last 3)  Recent Labs     05/12/24 1223 05/12/24 1703 05/12/24 2111  GLUCAP 106* 90 91    Assessment/Plan: S/P Procedure(s) (LRB): OFF PUMP CORONARY ARTERY BYPASS GRAFTING (CABG) TIMES TWO, USING LEFT INTERNAL MAMMARY ARTERY AND RIGHT LEG GREATER SAPHENOUS VEIN HARVESTED ENDOSCOPICALLY (N/A) ECHOCARDIOGRAM, TRANSESOPHAGEAL (N/A)  CV: Postop afib with RVR, on Amiodarone  gtt. In NSR, HR 60s-70s, plan to transition to PO Amiodarone . On Eliquis  for stroke prophylaxis. Marginal BP improved, SBP mostly 100-120s continue Midodrine  5mg  TID and Lopressor  12.5mg  BID. Patient would benefit from Lopressor  titration but BP is restrictive.    Pulm: Saturating well on RA. CXR with left basilar atelectasis, small bilateral pleural effusions improved and tiny right apical pneumothorax improved. Encourage IS and ambulation.    GI: +BM. Patient does not like the food here but is overall tolerating a diet. No further nausea/vomiting.    Endo: Prediabetes, preop A1C 5.3. No home meds. CBGs controlled on SSI. SSI and CBGs have been d/c'd   Renal: Yesterday's Cr 0.68, stable. UO 1680cc/24hrs recorded. At preop weight, Lasix  for pleural effusions d/c'd due to soft BP. Yesterday's K 3.8, supplement. BMET this AM pending   DVT Prophylaxis: Lovenox   IV Infiltrated: IV site on left forearm, warm compress was placed and arm was elevated. No erythema, some edema improved. Continue warm compresses and elevation.    Deconditioning: Ambulated well. PT/OT recommending HH PT/OT. Continue work with PT/OT   Dispo: Hopefully if remains in NSR on PO  Amiodarone  can d/c home tomorrow AM.    LOS: 7 days    Con GORMAN Bend, PA-C 05/15/2024  Agree Home tomorrow if remains in sinus  Quincey Nored O Rayshaun Needle

## 2024-05-15 NOTE — Progress Notes (Signed)
 Mobility Specialist Progress Note:   05/15/24 1100  Mobility  Activity Ambulated with assistance  Level of Assistance Contact guard assist, steadying assist  Assistive Device None  Distance Ambulated (ft) 200 ft  Activity Response Tolerated well  Mobility Referral Yes  Mobility visit 1 Mobility  Mobility Specialist Start Time (ACUTE ONLY) 1100  Mobility Specialist Stop Time (ACUTE ONLY) 1120  Mobility Specialist Time Calculation (min) (ACUTE ONLY) 20 min   Pt agreeable to mobility session. Required CGA to ambulate without AD use. Followed sternal precautions well. 70-80s NSR throughout. Left sitting in chair with all needs met.  Therisa Rana Mobility Specialist Please contact via SecureChat or  Rehab office at [phone]

## 2024-05-15 NOTE — Progress Notes (Signed)
 Mr. Belisle converted back to atrial fibrillation this afternoon with VR 100-120's.  His BP is stable and he is tolerating it OK.  He is anticoagulated with Eliquis . The amiodarone  infusion has been off since this morning and he has had 1 dose of oral amiodarone  today.  Will give a bolus of amiodarone  IV 150mg .   M. Lindia, PA-C

## 2024-05-16 ENCOUNTER — Encounter (HOSPITAL_COMMUNITY): Payer: Self-pay

## 2024-05-16 ENCOUNTER — Other Ambulatory Visit: Payer: Self-pay

## 2024-05-16 ENCOUNTER — Ambulatory Visit: Payer: Self-pay | Admitting: Thoracic Surgery (Cardiothoracic Vascular Surgery)

## 2024-05-16 DIAGNOSIS — I4891 Unspecified atrial fibrillation: Secondary | ICD-10-CM | POA: Diagnosis not present

## 2024-05-16 DIAGNOSIS — I959 Hypotension, unspecified: Secondary | ICD-10-CM | POA: Diagnosis not present

## 2024-05-16 DIAGNOSIS — I2510 Atherosclerotic heart disease of native coronary artery without angina pectoris: Secondary | ICD-10-CM | POA: Diagnosis not present

## 2024-05-16 DIAGNOSIS — I10 Essential (primary) hypertension: Secondary | ICD-10-CM

## 2024-05-16 DIAGNOSIS — Z951 Presence of aortocoronary bypass graft: Secondary | ICD-10-CM | POA: Diagnosis not present

## 2024-05-16 MED ORDER — AMIODARONE HCL IN DEXTROSE 360-4.14 MG/200ML-% IV SOLN
30.0000 mg/h | INTRAVENOUS | Status: DC
  Administered 2024-05-16 – 2024-05-17 (×2): 30 mg/h via INTRAVENOUS
  Filled 2024-05-16: qty 200

## 2024-05-16 MED ORDER — MIDODRINE HCL 5 MG PO TABS: 5.0000 mg | ORAL_TABLET | Freq: Three times a day (TID) | ORAL | Status: DC

## 2024-05-16 MED ORDER — AMIODARONE HCL IN DEXTROSE 360-4.14 MG/200ML-% IV SOLN
60.0000 mg/h | INTRAVENOUS | Status: DC
  Administered 2024-05-16: 60 mg/h via INTRAVENOUS
  Filled 2024-05-16: qty 200

## 2024-05-16 MED ORDER — METOPROLOL TARTRATE 12.5 MG HALF TABLET: 12.5000 mg | ORAL_TABLET | Freq: Two times a day (BID) | ORAL | Status: DC

## 2024-05-16 MED ORDER — METOPROLOL TARTRATE 25 MG PO TABS
25.0000 mg | ORAL_TABLET | Freq: Two times a day (BID) | ORAL | Status: DC
  Administered 2024-05-16: 25 mg via ORAL
  Filled 2024-05-16: qty 1

## 2024-05-16 MED ORDER — AMIODARONE LOAD VIA INFUSION
150.0000 mg | Freq: Once | INTRAVENOUS | Status: AC
  Administered 2024-05-16: 150 mg via INTRAVENOUS
  Filled 2024-05-16: qty 83.34

## 2024-05-16 MED ORDER — MIDODRINE HCL 5 MG PO TABS
5.0000 mg | ORAL_TABLET | Freq: Three times a day (TID) | ORAL | Status: DC
  Administered 2024-05-16 (×2): 5 mg via ORAL
  Filled 2024-05-16 (×2): qty 1

## 2024-05-16 NOTE — Progress Notes (Signed)
      [address] [place] [address]             [phone]    After patient's presyncopal episode I reached out to cardiology to alert them of the incident and I told them we had restarted Midodrine  5mg  TID and decreased Lopressor  to 12.5mg  BID. I also let Dr. Wendel know that the patient's latest BP was 103/69 and he was in rate controlled afib with rates in the 80s. The RN and charge RN let me know that Amiodarone  had been held due to the IV leaking and requiring a new IV. The patient received most of his bolus prior to this being held. Dr. Wendel was made aware of this and recommended we hold Lopressor  altogether and continue Amiodarone  as he felt this is more important. Lopressor  was discontinue and RN was made aware to restart Amiodarone  infusion. I also instructed the RN to give him his afternoon dose of Midodrine .   Today's Vitals   05/16/24 1218 05/16/24 1235 05/16/24 1240 05/16/24 1246  BP: 113/82 (!) 63/48 (!) 87/66 122/78  Pulse: 98   84  Resp: 19 13 17 14   Temp: 98 F (36.7 C)     TempSrc: Oral     SpO2: 100% 95% 95% 96%  Weight:      Height:      PainSc:       Body mass index is 24.68 kg/m.  Con GORMAN Bend, PA-C 05/16/24

## 2024-05-16 NOTE — Consult Note (Addendum)
 Cardiology Consultation   Patient ID: Edgar Garcia MRN: [id number]; DOB: 1951/03/11  Admit date: 05/08/2024 Date of Consult: 05/16/2024  PCP:  Leila Lucie LABOR, MD   [facility] Providers Cardiologist:  [name], MD        Patient Profile: Edgar Garcia is a 73 y.o. male with a hx of CAD with recent CABG x 2 off-pump complicated by postop A-fib, hypertension, hyperlipidemia, prediabetes, prior history of heavy alcohol use who is being seen 05/16/2024 for the evaluation of Afib RVR at the request of Dr. Shyrl.  History of Present Illness:  Edgar Garcia has a history of CAD s/p unsuccessful PCI attempt complicated by coronary dissection and periprocedural MI 07/26/2021.  LHC 02/28/2024 revealed severely calcified and tortuous LAD with 70% proximal disease, 99% mid vessel disease felt likely healed dissection from previously unsuccessful PCI attempt, and mildly elevated LVEDP.  RCA also had 80% proximal stenosis.  He was referred to CT surgery for consideration for CABG.  Echocardiogram showed preserved LVEF 55-60%, normal RV size and function, mild MR, and no aortic stenosis.  Aortic dilatation noted at 38 mm.  He was seen by cardiology 05/06/2024 as an urgent add-on following 2 syncopal events the day prior.  He was scheduled to have CABG 05/08/2024.  He underwent CABG x 2 with LIMA-LAD, SVG-D1 and off-pump fashion.  Postop course complicated by A-fib with RVR initially treated with IV amiodarone  transition to p.o. amiodarone .  He is continue to have bouts of A-fib with RVR in the setting of hypotension/borderline blood pressure.  He is currently on midodrine  5 mg 3 times daily.  Primary team attempted to titrate beta-blocker.  Given paroxysmal episodes of RVR, cardiology consult was requested.  He is frustrated at his post-op course. He was initially on amiodarone  gtt on 05/11/24.  PO amiodarone  was started, but he reverted back to Afib RVR and IV amiodarone  was re-instated.  He has  now been off of IV amiodarone  overnight, but received IV bolus along with lopressor  25 mg.   05/10/24: Lopressor  12.5 mg BID, IV amiodarone  infusion, D/C'ed 8/26 05/13/24: 400 mg amiodarone  BID 05/14/24: IV amiodarone  bolus + infusion - D/C'ed 8/28 at 1635 8/28 at 1648: 150 mg IV amiodarone  bolus Per pharmD, load of about 5.2 g so far  He is aware of his arrhythmia and RVR is limiting his activity, has not been able to ambulate in the last couple of days. No chest pain.    Past Medical History:  Diagnosis Date   Anginal pain (HCC)    Anxiety    Arthritis    CAD (coronary artery disease)    07/26/2021 unsuccessful PCI mLAD - went into Vfib on the table   GERD (gastroesophageal reflux disease)    Hyperlipidemia    Hypertension    Myocardial infarction (HCC)    Stroke (HCC)    TIA 2010 - No deficits   Substance abuse (HCC)    Hx of ETOH abuse. No alcohol in a few months as of 2025   TIA (transient ischemic attack)     Past Surgical History:  Procedure Laterality Date   COLONOSCOPY     CORONARY ARTERY BYPASS GRAFT N/A 05/08/2024   Procedure: OFF PUMP CORONARY ARTERY BYPASS GRAFTING (CABG) TIMES TWO, USING LEFT INTERNAL MAMMARY ARTERY AND RIGHT LEG GREATER SAPHENOUS VEIN HARVESTED ENDOSCOPICALLY;  Surgeon: Shyrl Linnie KIDD, MD;  Location: [facility] OR;  Service: Open Heart Surgery;  Laterality: N/A;   CORONARY BALLOON ANGIOPLASTY N/A 07/26/2021  Procedure: CORONARY BALLOON ANGIOPLASTY;  Surgeon: Elmira Newman PARAS, MD;  Location: [facility] INVASIVE CV LAB;  Service: Cardiovascular;  Laterality: N/A;   LEFT HEART CATH AND CORONARY ANGIOGRAPHY N/A 07/26/2021   Procedure: LEFT HEART CATH AND CORONARY ANGIOGRAPHY;  Surgeon: Elmira Newman PARAS, MD;  Location: [facility] INVASIVE CV LAB;  Service: Cardiovascular;  Laterality: N/A;   LEFT HEART CATH AND CORONARY ANGIOGRAPHY N/A 02/28/2024   Procedure: LEFT HEART CATH AND CORONARY ANGIOGRAPHY;  Surgeon: Elmira Newman PARAS, MD;  Location: [facility]  INVASIVE CV LAB;  Service: Cardiovascular;  Laterality: N/A;   TEE WITHOUT CARDIOVERSION N/A 05/08/2024   Procedure: ECHOCARDIOGRAM, TRANSESOPHAGEAL;  Surgeon: Shyrl Linnie KIDD, MD;  Location: [facility] OR;  Service: Open Heart Surgery;  Laterality: N/A;     Home Medications:  Prior to Admission medications   Medication Sig Start Date End Date Taking? Authorizing Provider  amLODipine  (NORVASC ) 5 MG tablet Take 5 mg by mouth in the morning. 06/14/21  Yes [provider]  aspirin  81 MG EC tablet Take 81 mg by mouth in the morning. 10/19/15  Yes [provider]  atenolol  (TENORMIN ) 25 MG tablet Take 1 tablet (25 mg total) by mouth daily. 01/17/24  Yes Patwardhan, Manish J, MD  atorvastatin  (LIPITOR ) 40 MG tablet TAKE 1 TABLET(40 MG) BY MOUTH EVERY EVENING 03/05/24  Yes Patwardhan, Manish J, MD  lisinopril  (ZESTRIL ) 20 MG tablet TAKE 1 TABLET BY MOUTH ONCE DAILY 01/30/24  Yes Patwardhan, Manish J, MD  pantoprazole  (PROTONIX ) 40 MG tablet Take 1 tablet (40 mg total) by mouth 2 (two) times daily. Please schedule a yearly follow up for further refills. Thank you 04/01/24  Yes Stacia Glendia BRAVO, MD  ranolazine  (RANEXA ) 1000 MG SR tablet Take 1 tablet (1,000 mg total) by mouth 2 (two) times daily. 01/17/24  Yes Patwardhan, Manish J, MD  vitamin B-12 (CYANOCOBALAMIN) 1000 MCG tablet Take 1,000 mcg by mouth in the morning.   Yes [provider]  hydrALAZINE  (APRESOLINE ) 50 MG tablet Take 1 tablet (50 mg total) by mouth 3 (three) times daily as needed (As needed for blood pressure spikes). 04/25/24   Patwardhan, Newman PARAS, MD  nitroGLYCERIN  (NITROSTAT ) 0.4 MG SL tablet Place 1 tablet (0.4 mg total) under the tongue every 5 (five) minutes as needed for chest pain. 06/29/21 05/06/24  Patwardhan, Newman PARAS, MD    Scheduled Meds:  amiodarone   150 mg Intravenous Once   apixaban   5 mg Oral BID   aspirin  EC  81 mg Oral Daily   atorvastatin   80 mg Oral Daily   bisacodyl   10 mg Oral Daily   Or    bisacodyl   10 mg Rectal Daily   Chlorhexidine  Gluconate Cloth  6 each Topical Daily   docusate sodium   200 mg Oral Daily   lactose free nutrition  237 mL Oral TID WC   metoprolol  tartrate  25 mg Oral BID   pantoprazole   40 mg Oral Daily   Continuous Infusions:  amiodarone      Followed by   amiodarone      PRN Meds: dextromethorphan -guaiFENesin , lactulose , metoprolol  tartrate, ondansetron  (ZOFRAN ) IV, mouth rinse, oxyCODONE , traMADol   Allergies:    Allergies  Allergen Reactions   Brilinta  [Ticagrelor ] Other (See Comments)    Headaches    Isordil  [Isosorbide  Nitrate] Other (See Comments)    Headaches    Social History:   Social History   Socioeconomic History   Marital status: Married    Spouse name: Not on file   Number of children:  3   Years of education: Not on file   Highest education level: Not on file  Occupational History   Not on file  Tobacco Use   Smoking status: Former    Current packs/day: 0.00    Average packs/day: 0.5 packs/day for 25.0 years (12.5 ttl pk-yrs)    Types: Cigarettes    Start date: 38    Quit date: 2013    Years since quitting: 12.6   Smokeless tobacco: Never  Vaping Use   Vaping status: Never Used  Substance and Sexual Activity   Alcohol use: Not Currently    Comment: Stopped drinking 2025 (at most a 6 pack per day)   Drug use: No   Sexual activity: Yes  Other Topics Concern   Not on file  Social History Narrative   Not on file   Social Drivers of Health   Financial Resource Strain: Not on file  Food Insecurity: Not on file  Transportation Needs: Not on file  Physical Activity: Not on file  Stress: Not on file  Social Connections: Not on file  Intimate Partner Violence: Not on file    Family History:    Family History  Problem Relation Age of Onset   Cancer Mother    Hypertension Sister    Hypertension Sister    Hypertension Sister    Colon polyps Brother    Hypertension Brother    Colon cancer Neg Hx     Esophageal cancer Neg Hx    Rectal cancer Neg Hx    Stomach cancer Neg Hx      ROS:  Please see the history of present illness.   All other ROS reviewed and negative.     Physical Exam/Data: Vitals:   05/15/24 2004 05/15/24 2017 05/15/24 2336 05/16/24 0336  BP: (!) 157/87 (!) 157/87 (!) 145/95 127/77  Pulse: (!) 110 (!) 110 (!) 104 79  Resp: 20  20 20   Temp: 98.5 F (36.9 C)  98.4 F (36.9 C) 98.4 F (36.9 C)  TempSrc: Oral  Oral Oral  SpO2: 94%  96% 96%  Weight:    74.7 kg  Height:        Intake/Output Summary (Last 24 hours) at 05/16/2024 1125 Last data filed at 05/15/2024 2227 Gross per 24 hour  Intake 240 ml  Output 850 ml  Net -610 ml      05/16/2024    3:36 AM 05/15/2024    3:07 AM 05/14/2024    6:41 AM  Last 3 Weights  Weight (lbs) 164 lb 11.2 oz 165 lb 1.6 oz 162 lb 3.2 oz  Weight (kg) 74.707 kg 74.889 kg 73.573 kg     Body mass index is 24.68 kg/m.  General:  Well nourished, well developed, in no acute distress HEENT: normal Neck: no JVD Vascular: No carotid bruits; Distal pulses 2+ bilaterally Cardiac:  irregular rhythm, tachycardic rate  Lungs:  clear to auscultation bilaterally, no wheezing, rhonchi or rales  Abd: soft, nontender, no hepatomegaly  Ext: no edema Musculoskeletal:  No deformities, BUE and BLE strength normal and equal Skin: warm and dry  Neuro:  CNs 2-12 intact, no focal abnormalities noted Psych:  Normal affect   EKG:  The EKG was personally reviewed and demonstrates:  Afib with VR 129, PVC Telemetry:  Telemetry was personally reviewed and demonstrates:  Afib with RVR primarily in the 120-130s, HR in the 60s when in SR  Relevant CV Studies:  Echo 04/15/24:  1. Left ventricular ejection fraction, by  estimation, is 55 to 60%. The  left ventricle has normal function. The left ventricle has no regional  wall motion abnormalities. Left ventricular diastolic parameters are  indeterminate.   2. Right ventricular systolic function is  normal. The right ventricular  size is normal. There is mildly elevated pulmonary artery systolic  pressure.   3. The mitral valve is degenerative. Mild mitral valve regurgitation. No  evidence of mitral stenosis.   4. The aortic valve is calcified. Aortic valve regurgitation is not  visualized. Aortic valve sclerosis/calcification is present, without any  evidence of aortic stenosis.   5. Aortic dilatation noted. There is mild dilatation of the ascending  aorta, measuring 38 mm.   6. The inferior vena cava is normal in size with greater than 50%  respiratory variability, suggesting right atrial pressure of 3 mmHg.    LHC 02/28/24:   Prox LAD lesion is 70% stenosed.   Prox RCA lesion is 90% stenosed.   Mid LAD lesion is 99% stenosed.   LV end diastolic pressure is mildly elevated.   Recommend Aspirin  81mg  daily for moderate CAD.   Coronary angiography 02/28/2024: LM: Normal LAD: Severely calcified and tortuous vessel          Proximal diffuse 70% disease          Mid 99% stenosis (likely healed dissection from previously unsuccessful PCI attempt)          Distal vessel gets antegrade flow through a small channel Lcx: Large dominant vessel, no significant disease RCA: Small nondominant vessel, proximal 80% stenosis   LVEDP 26 mmHg      Functionally, single-vessel obstructive disease of LAD (severe stenosis in proximal RCA, but it is a small nondominant vessel) Anterolateral infarct, but moderate peri-infarct ischemia Patient continues to have angina symptoms on optimal medical therapy I do think he would benefit from revascularization to LAD.  Best case scenario would be a robotic assisted LIMA-LAD graft.  The target for LIMA-LAD graft may well be quite distal.  I would refer him to Dr. Shyrl for consideration for this.  If he is not deemed to be candidate for robotic assisted LIMA-LAD graft, then revascularization option would include sternotomy LIMA-LAD graft versus repeat  attempt of a very complex intervention.   Laboratory Data: High Sensitivity Troponin:  No results for input(s): TROPONINIHS in the last 720 hours.   Chemistry Recent Labs  Lab 05/09/24 1647 05/10/24 0510 05/12/24 0342 05/13/24 0452 05/14/24 0637 05/15/24 1113  NA 127*   < > 129* 129* 128* 127*  K 3.9   < > 3.6 3.8 3.8 4.1  CL 95*   < > 96* 95* 93* 93*  CO2 25   < > 25 23 23 22   GLUCOSE 128*   < > 96 84 99 152*  BUN 8   < > 6* 8 6* 6*  CREATININE 0.68   < > 0.72 0.68 0.64 0.71  CALCIUM  8.0*   < > 7.9* 7.9* 7.9* 8.7*  MG 2.2  --  1.7  --   --   --   GFRNONAA >60   < > >60 >60 >60 >60  ANIONGAP 7   < > 8 11 12 12    < > = values in this interval not displayed.    No results for input(s): PROT, ALBUMIN , AST, ALT, ALKPHOS, BILITOT in the last 168 hours. Lipids  Recent Labs  Lab 05/10/24 0510  CHOL 66  TRIG 42  HDL 35*  LDLCALC 23  CHOLHDL 1.9    Hematology Recent Labs  Lab 05/09/24 1647 05/10/24 0510 05/11/24 0400  WBC 12.4* 9.0 7.7  RBC 2.71* 2.38* 2.46*  HGB 9.5* 8.4* 8.6*  HCT 27.6* 24.3* 24.7*  MCV 101.8* 102.1* 100.4*  MCH 35.1* 35.3* 35.0*  MCHC 34.4 34.6 34.8  RDW 11.9 11.9 11.8  PLT 216 174 169   Thyroid  Recent Labs  Lab 05/14/24 0838  TSH 2.418    BNPNo results for input(s): BNP, PROBNP in the last 168 hours.  DDimer No results for input(s): DDIMER in the last 168 hours.  Radiology/Studies:  DG Chest 2 View Result Date: 05/13/2024 EXAM: 2 VIEW(S) XRAY OF THE CHEST 05/13/2024 05:08:51 AM COMPARISON: 05/10/2024 CLINICAL HISTORY: [id number] Pneumothorax, right [id number]. S/P cabg 8/21; Evaluate; Pneumothorax; left base atelectasis or infiltrate ; Lines/ caths FINDINGS: LUNGS AND PLEURA: Small bilateral pleural effusions persist. No pneumothorax. HEART AND MEDIASTINUM: No acute abnormality of the cardiac and mediastinal silhouettes. Sternotomy wires and CABG marker. BONES AND SOFT TISSUES: No acute osseous abnormality. Aortic  atherosclerosis (ICD10-I70.0). Right IJ central venous catheter and mediastinal drain have been removed. IMPRESSION: 1. No acute findings. 2. Small bilateral pleural effusions persist. Electronically signed by: Katheleen Faes MD 05/13/2024 09:20 AM EDT RP Workstation: [id number]     Assessment and Plan:  Afib with RVR Hypotension - initially treated with IV amiodarone  with conversion to SR, but reverted back to Afib with RVR and placed back on IV amiodarone , continued on 400 mg amiodarone  BID - received 150 mg bolus yesterday afternoon, remains in Afib with VR 120-130s - rate control has been limited by BP - was on midodrine  5 mg TID --> BP has been improved, was hypertensive last evening - lopressor  25 mg BID - I have D/C'ed midodrine  and will monitor BP - will restart IV amiodarone , hold PO dosing for now - if BP tolerates, will increase lopressor  to TID - will hold off on digoxin for now - if he remains in Afib RVR over the weekend, not paroxysmal, then would plan for TEE-DCCV Monday - I will hold a slot for him, but he is not yet consented   Chronic anticoagulation - continue eliquis  5 mg BID - given syncope prior to surgery, can't rule out arrhythmia prior to admission - would need TEE   CAD s/p CABG x 2 - on ASA, BB - per CT surgery - defer diuretic management to them   Hypertension - BP has been labile - will trial off midodrine  as above   Hyperlipidemia with LDL goal < 55 05/10/2024: Cholesterol 66; HDL 35; LDL Cholesterol 23; Triglycerides 42; VLDL 8    Risk Assessment/Risk Scores:         CHA2DS2-VASc Score = 5   This indicates a 7.2% annual risk of stroke. The patient's score is based upon: CHF History: 0 HTN History: 1 Diabetes History: 1 Stroke History: 0 Vascular Disease History: 1 Age Score: 2 Gender Score: 0        For questions or updates, please contact [facility] Please consult [email or web address] for contact info under     Signed, Jon Nat Hails, PA  05/16/2024 11:25 AM  ATTENDING ATTESTATION:  After conducting a review of all available clinical information with the care team, interviewing the patient, and performing a physical exam, I agree with the findings and plan described in this note.   GEN: No acute distress, AO x 3 HEENT:  MMM, no JVD, no scleral icterus Cardiac: Irregular rate and rhythm,  no murmurs, rubs, or gallops.  Respiratory: Decreased breath sounds at both bases GI: Soft, nontender, non-distended  MS: No edema; No deformity. Neuro:  Nonfocal  Vasc:  +2 radial pulses  The patient is a 73 year old male with a history of coronary artery disease status post two-vessel off-pump CABG consisting of a LIMA to LAD and vein graft to diagonal, hypertension, hyperlipidemia, and prediabetes who is developed postoperative atrial fibrillation.  The patient was started on IV amiodarone  infusion on August 23 which was discontinued on the 26.  He was then started on 400 mg of amiodarone  twice daily.  Unfortunately his atrial fibrillation recurred and was restarted on a bolus and infusion on the 27th which was discontinued on the 28th.  Unfortunately his atrial fibrillation recurred.  Will resume IV amiodarone .  I am hopeful that the patient will convert over the weekend.  He should be watched at least 1 day after conversion to ensure this remains durable.  In addition to IV amiodarone  will increase metoprolol  to 25 mg twice daily.  Consider digoxin over the weekend if he does not convert.  If all else fails we will pursue TEE cardioversion after weekend amiodarone  fusion on Monday.  Reviewed plan with patient and wife in detail.  Lurena Red, MD Pager [phone]

## 2024-05-16 NOTE — Progress Notes (Signed)
 CARDIAC REHAB PHASE I   PRE:  Rate/Rhythm: 136 afib    BP: sitting 121/83    SpO2: 98 RA  MODE:  Ambulation: 230 ft   POST:  Rate/Rhythm: 165 afib    BP: sitting 112/88     SpO2: 99 RA   Pt tolerated fairly well, standby assist with RW. HR up to 165 max, mostly 150s walking. He did c/o weakness toward end. BP slightly lower after walk in recliner. He can walk with his wife or own his own if his RN feels comfortable given his afib. Eager to ambulate more. Reviewed ed with pt and wife.  [id number]  Aliene Aris BS, ACSM-CEP 05/16/2024 11:41 AM

## 2024-05-16 NOTE — Progress Notes (Signed)
 I was notified by nurse patient had brief syncopal episode after walking. Staff was in the room;he did not fall or hit his head and he was diaphoretic. SBP initially taken was in the 60's. He was put in Trendelenburg and BP retaken with SBP in the high 80's. Of note, Lopressor  was increased this am to 25 mg bid and Midodrine  5 mg tid was stopped (he did receive last dose after 8 am). Patient's SBP now in the 120's. I decreased scheduled Lopressor  back to 12.5 mg bid and restarted Midodrine  5 mg tid. Cardiology notified. Monitor.

## 2024-05-16 NOTE — Progress Notes (Signed)
 PT Cancellation Note  Patient Details Name: Edgar Garcia MRN: [id number] DOB: 1951-03-15   Cancelled Treatment:    Reason Eval/Treat Not Completed: (P) Medical issues which prohibited therapy (RN defer due to pt recent pre-syncopal episode and multiple recent medication changes.) Will continue efforts per PT plan of care next date as schedule permits.  Connell HERO [name] 05/16/2024, 2:55 PM

## 2024-05-16 NOTE — Progress Notes (Addendum)
   05/16/24 1235  Vitals  BP (!) 63/48  MAP (mmHg) (!) 54  BP Location Right Arm  BP Method Automatic  Patient Position (if appropriate) Sitting  ECG Heart Rate 66  Resp 13  MEWS COLOR  MEWS Score Color Red  Oxygen Therapy  SpO2 95 %  O2 Device Room Air  MEWS Score  MEWS Temp 0  MEWS Systolic 3  MEWS Pulse 0  MEWS RR 1  MEWS LOC 0  MEWS Score 4   Patient with a near syncopal episode after ambulation while getting washed up. Staff in room, above vitals were taken, unable to get a blood pressure reading the above was the second reading, patient diaphoretic and dizzy,bp once lying down 87/66. Iv team in room to start new IV, Donielle Zimmerman [facility] made aware orders received.. Bedside RN aware and in room. Dastan Krider, Allean Palin rN RN

## 2024-05-16 NOTE — Progress Notes (Signed)
 Endoscopy closed on Monday for the holiday. Pt is scheduled for Tues 05/20/24.

## 2024-05-16 NOTE — Progress Notes (Addendum)
 [address] [place] [employer] [id number]             [phone]      8 Days Post-Op Procedure(s) (LRB): OFF PUMP CORONARY ARTERY BYPASS GRAFTING (CABG) TIMES TWO, USING LEFT INTERNAL MAMMARY ARTERY AND RIGHT LEG GREATER SAPHENOUS VEIN HARVESTED ENDOSCOPICALLY (N/A) ECHOCARDIOGRAM, TRANSESOPHAGEAL (N/A) Subjective: Patient is sitting up in bed reports he ate a good lunch yesterday. He does feel like his mouth is dry from the pain medication he received last night.   Objective: Vital signs in last 24 hours: Temp:  [98.2 F (36.8 C)-98.5 F (36.9 C)] 98.4 F (36.9 C) (08/29 0336) Pulse Rate:  [65-110] 79 (08/29 0336) Cardiac Rhythm: Atrial fibrillation (08/28 1900) Resp:  [16-20] 20 (08/29 0336) BP: (119-157)/(68-95) 127/77 (08/29 0336) SpO2:  [94 %-97 %] 96 % (08/29 0336) Weight:  [74.7 kg] 74.7 kg (08/29 0336)  Hemodynamic parameters for last 24 hours:    Intake/Output from previous day: 08/28 0701 - 08/29 0700 In: 240 [P.O.:240] Out: 1050 [Urine:1050] Intake/Output this shift: No intake/output data recorded.  General appearance: alert, cooperative, and no distress Neurologic: intact Heart: irregularly irregular rhythm Lungs: clear to auscultation bilaterally Abdomen: soft, non-tender; bowel sounds normal; no masses,  no organomegaly Extremities: extremities normal, atraumatic, no cyanosis or edema Wound: Clean and dry without sign of infection, slight edema of the left forearm improved, no erythema or sign of infection  Lab Results: No results for input(s): WBC, HGB, HCT, PLT in the last 72 hours. BMET:  Recent Labs    05/14/24 0637 05/15/24 1113  NA 128* 127*  K 3.8 4.1  CL 93* 93*  CO2 23 22  GLUCOSE 99 152*  BUN 6* 6*  CREATININE 0.64 0.71  CALCIUM  7.9* 8.7*    PT/INR: No results for input(s): LABPROT, INR in the last 72 hours. ABG    Component Value Date/Time   PHART 7.356 05/08/2024 1843   HCO3 22.1 05/08/2024  1843   TCO2 23 05/08/2024 1843   ACIDBASEDEF 3.0 (H) 05/08/2024 1843   O2SAT 97 05/08/2024 1843   CBG (last 3)  No results for input(s): GLUCAP in the last 72 hours.  Assessment/Plan: S/P Procedure(s) (LRB): OFF PUMP CORONARY ARTERY BYPASS GRAFTING (CABG) TIMES TWO, USING LEFT INTERNAL MAMMARY ARTERY AND RIGHT LEG GREATER SAPHENOUS VEIN HARVESTED ENDOSCOPICALLY (N/A) ECHOCARDIOGRAM, TRANSESOPHAGEAL (N/A)  CV: Postop afib with RVR transitioned to PO Amiodarone  yesterday, now back in afib, HR 110s this AM but does get up into the 140s. Will titrate Lopressor  and likely consult cardiology. On Eliquis  for stroke prophylaxis. Marginal BP improved, SBP into the 140s, continue Midodrine  5mg  TID.    Pulm: Saturating well on RA. CXR with left basilar atelectasis, small bilateral pleural effusions improved and tiny right apical pneumothorax improved. Encourage IS and ambulation.    GI: No BM since Tuesday, continue lactulose  PRN. Patient does not like the food here but is overall tolerating a diet, needs to improve diet and in length discussion has been had regarding importance of nutrition. No further nausea/vomiting.    Endo: Prediabetes, preop A1C 5.3. No home meds. CBGs controlled on SSI. SSI and CBGs have been d/c'd   Renal: Yesterday's Cr 0.71, stable. UO 1050cc/24hrs recorded. At preop weight, Lasix  has been d/c'd. Yesterday's K 4.1, at goal.    DVT Prophylaxis: Lovenox    IV Infiltrated: IV site on left forearm, warm compress was placed and arm was elevated. No erythema, some  edema improved. Continue warm compresses and elevation.    Deconditioning: Ambulated well. PT/OT recommending HH PT/OT. Continue work with PT/OT   Dispo: Continues to go in and out of afib with RVR, will consult cardiology.    LOS: 8 days    Con GORMAN Bend 05/16/2024  Continues to have atrial fibrillation with RVR.  Cardiology has been consulted. Currently on Eliquis . Home once rate controlled.  Sarea Fyfe  MALVA Rayas

## 2024-05-16 NOTE — Plan of Care (Signed)
  Problem: Education: Goal: Knowledge of General Education information will improve Description: Including pain rating scale, medication(s)/side effects and non-pharmacologic comfort measures Outcome: Progressing   Problem: Clinical Measurements: Goal: Ability to maintain clinical measurements within normal limits will improve Outcome: Progressing Goal: Will remain free from infection Outcome: Progressing Goal: Diagnostic test results will improve Outcome: Progressing Goal: Respiratory complications will improve Outcome: Progressing Goal: Cardiovascular complication will be avoided Outcome: Progressing   Problem: Activity: Goal: Risk for activity intolerance will decrease Outcome: Progressing   Problem: Nutrition: Goal: Adequate nutrition will be maintained Outcome: Progressing   Problem: Coping: Goal: Level of anxiety will decrease Outcome: Progressing   Problem: Elimination: Goal: Will not experience complications related to bowel motility Outcome: Progressing Goal: Will not experience complications related to urinary retention Outcome: Progressing   Problem: Pain Managment: Goal: General experience of comfort will improve and/or be controlled Outcome: Progressing   Problem: Safety: Goal: Ability to remain free from injury will improve Outcome: Progressing   Problem: Skin Integrity: Goal: Risk for impaired skin integrity will decrease Outcome: Progressing   Problem: Education: Goal: Will demonstrate proper wound care and an understanding of methods to prevent future damage Outcome: Progressing Goal: Knowledge of disease or condition will improve Outcome: Progressing Goal: Knowledge of the prescribed therapeutic regimen will improve Outcome: Progressing   Problem: Activity: Goal: Risk for activity intolerance will decrease Outcome: Progressing   Problem: Cardiac: Goal: Will achieve and/or maintain hemodynamic stability Outcome: Progressing   Problem:  Clinical Measurements: Goal: Postoperative complications will be avoided or minimized Outcome: Progressing   Problem: Respiratory: Goal: Respiratory status will improve Outcome: Progressing   Problem: Skin Integrity: Goal: Wound healing without signs and symptoms of infection Outcome: Progressing Goal: Risk for impaired skin integrity will decrease Outcome: Progressing   Problem: Urinary Elimination: Goal: Ability to achieve and maintain adequate renal perfusion and functioning will improve Outcome: Progressing

## 2024-05-17 DIAGNOSIS — Z951 Presence of aortocoronary bypass graft: Secondary | ICD-10-CM | POA: Diagnosis not present

## 2024-05-17 DIAGNOSIS — I4891 Unspecified atrial fibrillation: Secondary | ICD-10-CM | POA: Diagnosis not present

## 2024-05-17 LAB — CBC
HCT: 35.5 % — ABNORMAL LOW (ref 39.0–52.0)
Hemoglobin: 11.7 g/dL — ABNORMAL LOW (ref 13.0–17.0)
MCH: 34.6 pg — ABNORMAL HIGH (ref 26.0–34.0)
MCHC: 33 g/dL (ref 30.0–36.0)
MCV: 105 fL — ABNORMAL HIGH (ref 80.0–100.0)
Platelets: 447 K/uL — ABNORMAL HIGH (ref 150–400)
RBC: 3.38 MIL/uL — ABNORMAL LOW (ref 4.22–5.81)
RDW: 12.3 % (ref 11.5–15.5)
WBC: 12.1 K/uL — ABNORMAL HIGH (ref 4.0–10.5)
nRBC: 0 % (ref 0.0–0.2)

## 2024-05-17 LAB — GLUCOSE, CAPILLARY: Glucose-Capillary: 96 mg/dL (ref 70–99)

## 2024-05-17 MED ORDER — MIDODRINE HCL 5 MG PO TABS
5.0000 mg | ORAL_TABLET | Freq: Two times a day (BID) | ORAL | Status: DC
  Administered 2024-05-18 – 2024-05-19 (×3): 5 mg via ORAL
  Filled 2024-05-17 (×3): qty 1

## 2024-05-17 MED ORDER — AMIODARONE HCL 200 MG PO TABS
400.0000 mg | ORAL_TABLET | Freq: Two times a day (BID) | ORAL | Status: DC
  Administered 2024-05-17: 400 mg via ORAL
  Filled 2024-05-17: qty 2

## 2024-05-17 MED ORDER — AMIODARONE HCL IN DEXTROSE 360-4.14 MG/200ML-% IV SOLN
30.0000 mg/h | INTRAVENOUS | Status: AC
  Administered 2024-05-17: 30 mg/h via INTRAVENOUS
  Filled 2024-05-17: qty 200

## 2024-05-17 NOTE — Progress Notes (Addendum)
 Pt HR 105 -130s. Afib. Current order for amio gtt to be discontinued at 1900 replacing with amio PO. Paged PA, awaiting return call.  1849 -Pt HR 103-118. Afib. Paged oncall cardiology [phone].   Markeise Mathews L Cleone Hulick, RN

## 2024-05-17 NOTE — Progress Notes (Signed)
 CARDIAC REHAB PHASE I   PRE:  Rate/Rhythm: 75 NSR  BP:  Sitting: 159/75   Standing: 123/80      SpO2: 97 RA  MODE:  Ambulation: 240 ft    POST:  Rate/Rhythm: 86 NSR  BP:  Sitting: 118/89      SpO2: 97 RA  Pt ambulated with CGA using gait belt and RW in the hallway. Pt describes feeling weak and muscle fatigue but denies feeling lightheaded or SOB while walking. Pt was returned to room and helped to chair.   GARRELL FLAGG  MS, ACSM-CEP 9:55 AM 05/17/2024    Service time is from 0930 to 0955.

## 2024-05-17 NOTE — Progress Notes (Signed)
   Rounding Note    Patient Name: Edgar Garcia Date of Encounter: 05/17/2024  [facility] Cardiologist: [name], MD   Subjective   No acute events overnight.  Sinus rhythm in the 60s.  He is very frustrated at having to remain in the hospital for his ongoing atrial arrhythmias.  Family at the bedside.  The patient continued to ask how long he would need to be in the hospital and how long he would require IV amiodarone .  Vital Signs    Vitals:   05/16/24 2308 05/17/24 0331 05/17/24 0637 05/17/24 0749  BP: 121/69 128/87  (!) 157/84  Pulse: 62 94 60 71  Resp: 19 17 19 11   Temp: 98.2 F (36.8 C) 97.7 F (36.5 C)  98.3 F (36.8 C)  TempSrc: Oral Oral  Oral  SpO2: 97% 97% 95% 100%  Weight:   75.3 kg   Height:        Intake/Output Summary (Last 24 hours) at 05/17/2024 1000 Last data filed at 05/17/2024 0854 Gross per 24 hour  Intake 720 ml  Output 980 ml  Net -260 ml      05/17/2024    6:37 AM 05/16/2024    3:36 AM 05/15/2024    3:07 AM  Last 3 Weights  Weight (lbs) 166 lb 0.1 oz 164 lb 11.2 oz 165 lb 1.6 oz  Weight (kg) 75.3 kg 74.707 kg 74.889 kg      Telemetry    Sinus rhythm- Personally Reviewed  ECG    Personally Reviewed  Physical Exam    GEN: No acute distress.  Elderly Cardiac: RRR, no murmurs, rubs, or gallops.  Respiratory: Clear to auscultation bilaterally. Psych: Normal affect   Assessment & Plan    #Postoperative atrial fibrillation Improving on IV amiodarone  Recommend continuing IV amiodarone  until this afternoon.  Then transition to 400 mg by mouth twice daily. Continue Lopressor  Continue Eliquis   #Coronary artery disease No ischemic symptoms today.  Doing well after his bypass  Plan discussed with CT surgeon on-call.    Ole T. Cindie, MD, [facility], [facility] Cardiac Electrophysiology

## 2024-05-17 NOTE — Progress Notes (Addendum)
                  [address]           Th[place], [place] [address]                     [phone]        9 Days Post-Op Procedure(s) (LRB): OFF PUMP CORONARY ARTERY BYPASS GRAFTING (CABG) TIMES TWO, USING LEFT INTERNAL MAMMARY ARTERY AND RIGHT LEG GREATER SAPHENOUS VEIN HARVESTED ENDOSCOPICALLY (N/A) ECHOCARDIOGRAM, TRANSESOPHAGEAL (N/A)  Subjective: Patient sitting in chair this am. He had more coughing last night (able to expectorate on his own, and is clear).   Objective: Vital signs in last 24 hours: Temp:  [97.7 F (36.5 C)-98.2 F (36.8 C)] 97.7 F (36.5 C) (08/30 0331) Pulse Rate:  [60-103] 60 (08/30 0637) Cardiac Rhythm: Atrial fibrillation (08/29 1900) Resp:  [13-19] 19 (08/30 0637) BP: (63-128)/(48-87) 128/87 (08/30 0331) SpO2:  [88 %-100 %] 95 % (08/30 0637) Weight:  [75.3 kg] 75.3 kg (08/30 0637)  Pre op weight 75.1 kg Current Weight  05/17/24 75.3 kg      Intake/Output from previous day: 08/29 0701 - 08/30 0700 In: 480 [P.O.:480] Out: 980 [Urine:980]   Physical Exam:  Cardiovascular: RRR Pulmonary: Clear to auscultation bilaterally Abdomen: Soft, non tender, bowel sounds present. Extremities: No lower extremity edema. Wounds: Clean and dry.  No erythema or signs of infection.  Lab Results: CBC:No results for input(s): WBC, HGB, HCT, PLT in the last 72 hours. BMET:  Recent Labs    05/15/24 1113  NA 127*  K 4.1  CL 93*  CO2 22  GLUCOSE 152*  BUN 6*  CREATININE 0.71  CALCIUM  8.7*    PT/INR:  Lab Results  Component Value Date   INR 1.3 (H) 05/08/2024   INR 1.0 05/06/2024   ABG:  INR: Will add last result for INR, ABG once components are confirmed Will add last 4 CBG results once components are confirmed  Assessment/Plan:  1. CV - Previous a fib with RVR. SR this am (appears to have converted after 11 pm).  On Amiodarone  drip, Midodrine  5 mg tid, and Apixaban  5 mg bid. More hypertensive this am. Of note,  Lopressor  stopped yesterday (given one dose;low BP). Will decrease Midodrine , defer to cardiology if/when to consider BB, and could consider restarting low dose Lisinopril  if BP remains elevated.  DCCV tentatively scheduled for Tuesday 09/02. 2.  Pulmonary - On room air. Encourage incentive spirometer 3.  Expected post op acute blood loss anemia - H and H yesterday stable at 8.6 and 24.7. Await this am's result. 4. IV infiltration right forearm-dressing clean, dry. Warm compresses and elevation 5. Deconditioned-home PT/OT arranged  Kyla HERO ZimmermanPA-C 7:37 AM Patient seen and examined, d/w Ms. Dwan and Dr. Cindie Plan to convert to PO amiodarone  later today  Elspeth C. Kerrin, MD [facility] [phone]

## 2024-05-17 NOTE — Progress Notes (Signed)
 Mr. Hollern went back into atrial fibrillation at rates ~120 bpm. He was given the oral amiodarone  this afternoon, and the infusion was about to be discontinued. We will continue the infusion overnight. Oral amiodarone  was discontinued.   Merlene Blood, MD MS  Cardiology [facility]

## 2024-05-18 DIAGNOSIS — I4891 Unspecified atrial fibrillation: Secondary | ICD-10-CM | POA: Diagnosis not present

## 2024-05-18 DIAGNOSIS — Z951 Presence of aortocoronary bypass graft: Secondary | ICD-10-CM | POA: Diagnosis not present

## 2024-05-18 LAB — BASIC METABOLIC PANEL WITH GFR
Anion gap: 12 (ref 5–15)
BUN: 5 mg/dL — ABNORMAL LOW (ref 8–23)
CO2: 24 mmol/L (ref 22–32)
Calcium: 8 mg/dL — ABNORMAL LOW (ref 8.9–10.3)
Chloride: 91 mmol/L — ABNORMAL LOW (ref 98–111)
Creatinine, Ser: 0.7 mg/dL (ref 0.61–1.24)
GFR, Estimated: >60 mL/min (ref >60)
Glucose, Bld: 105 mg/dL — ABNORMAL HIGH (ref 70–99)
Potassium: 3.7 mmol/L (ref 3.5–5.1)
Sodium: 127 mmol/L — ABNORMAL LOW (ref 135–145)

## 2024-05-18 LAB — CBC
HCT: 29.8 % — ABNORMAL LOW (ref 39.0–52.0)
Hemoglobin: 10.3 g/dL — ABNORMAL LOW (ref 13.0–17.0)
MCH: 34.2 pg — ABNORMAL HIGH (ref 26.0–34.0)
MCHC: 34.6 g/dL (ref 30.0–36.0)
MCV: 99 fL (ref 80.0–100.0)
Platelets: 535 K/uL — ABNORMAL HIGH (ref 150–400)
RBC: 3.01 MIL/uL — ABNORMAL LOW (ref 4.22–5.81)
RDW: 12.1 % (ref 11.5–15.5)
WBC: 11.1 K/uL — ABNORMAL HIGH (ref 4.0–10.5)
nRBC: 0 % (ref 0.0–0.2)

## 2024-05-18 LAB — MAGNESIUM: Magnesium: 1.8 mg/dL (ref 1.7–2.4)

## 2024-05-18 MED ORDER — AMIODARONE HCL 200 MG PO TABS
400.0000 mg | ORAL_TABLET | Freq: Two times a day (BID) | ORAL | Status: DC
  Administered 2024-05-18 – 2024-05-19 (×3): 400 mg via ORAL
  Filled 2024-05-18 (×3): qty 2

## 2024-05-18 MED ORDER — POTASSIUM CHLORIDE CRYS ER 20 MEQ PO TBCR
40.0000 meq | EXTENDED_RELEASE_TABLET | Freq: Once | ORAL | Status: AC
  Administered 2024-05-18: 40 meq via ORAL
  Filled 2024-05-18: qty 2

## 2024-05-18 NOTE — Plan of Care (Signed)

## 2024-05-18 NOTE — Progress Notes (Signed)
   Rounding Note    Patient Name: Edgar Garcia Date of Encounter: 05/18/2024  [facility] Cardiologist: [name], MD   Subjective   No acute events overnight.  Return to atrial fibrillation yesterday with rates improved from prior days.  Was restarted on IV drip and now is back in sinus rhythm.  Another very long conversation with the patient and his wife this morning about his atrial fibrillation and treatment.  Vital Signs    Vitals:   05/17/24 2359 05/18/24 0300 05/18/24 0500 05/18/24 0901  BP: (!) 136/92 105/74  135/77  Pulse: (!) 107 (!) 101    Resp: 18 16  16   Temp: 98.6 F (37 C) 98.2 F (36.8 C)  98.6 F (37 C)  TempSrc: Oral Oral  Oral  SpO2: 99% 93%    Weight:   75.3 kg   Height:        Intake/Output Summary (Last 24 hours) at 05/18/2024 0910 Last data filed at 05/18/2024 0900 Gross per 24 hour  Intake 641.45 ml  Output 1200 ml  Net -558.55 ml      05/18/2024    5:00 AM 05/17/2024    6:37 AM 05/16/2024    3:36 AM  Last 3 Weights  Weight (lbs) 166 lb 0.1 oz 166 lb 0.1 oz 164 lb 11.2 oz  Weight (kg) 75.3 kg 75.3 kg 74.707 kg      Telemetry    Sinus rhythm- Personally Reviewed  ECG    Personally Reviewed  Physical Exam    GEN: No acute distress.  Elderly Cardiac: RRR, no murmurs, rubs, or gallops.  Respiratory: Clear to auscultation bilaterally. Psych: Normal affect   Assessment & Plan    #Postoperative atrial fibrillation Stop IV amiodarone  Start oral amiodarone  400 mg by mouth twice daily.  He should stay on twice daily dosing for the next 7 days then decrease to 400 mg by mouth once daily for 7 days then decrease to 200 mg by mouth once daily until seen in the cardiology clinic.    I would not recommend restarting IV amiodarone  if he returns to atrial fibrillation.  I did tell him he will likely go in and out of atrial fibrillation as his chest heals and inflammation resolves. Continue Lopressor  Continue  Eliquis   #Coronary artery disease No ischemic symptoms today.  Doing well after his bypass      Ole T. Cindie, MD, [facility], [facility] Cardiac Electrophysiology

## 2024-05-18 NOTE — Progress Notes (Addendum)
                  [address]           Edgar Garcia [place], [place] [address]                     [phone]        10 Days Post-Op Procedure(s) (LRB): OFF PUMP CORONARY ARTERY BYPASS GRAFTING (CABG) TIMES TWO, USING LEFT INTERNAL MAMMARY ARTERY AND RIGHT LEG GREATER SAPHENOUS VEIN HARVESTED ENDOSCOPICALLY (N/A) ECHOCARDIOGRAM, TRANSESOPHAGEAL (N/A)  Subjective: Patient states a new IV was put in near previous infiltrated IV. New IV does not bother him. He has no other complaint.  Objective: Vital signs in last 24 hours: Temp:  [97.6 F (36.4 C)-98.6 F (37 C)] 98.2 F (36.8 C) (08/31 0300) Pulse Rate:  [71-109] 101 (08/31 0300) Cardiac Rhythm: Normal sinus rhythm (08/31 0625) Resp:  [11-20] 16 (08/31 0300) BP: (105-157)/(74-97) 105/74 (08/31 0300) SpO2:  [93 %-100 %] 93 % (08/31 0300) Weight:  [75.3 kg] 75.3 kg (08/31 0500)  Pre op weight 75.1 kg Current Weight  05/18/24 75.3 kg      Intake/Output from previous day: 08/30 0701 - 08/31 0700 In: 771.5 [P.O.:360; I.V.:411.5] Out: 1200 [Urine:1200]   Physical Exam:  Cardiovascular: RRR Pulmonary: Clear to auscultation bilaterally Abdomen: Soft, non tender, bowel sounds present. Extremities: No lower extremity edema. Wounds: Sternal and RLE wounds are clean and dry.  No erythema or signs of infection.  Lab Results: CBC: Recent Labs    05/17/24 0908 05/18/24 0357  WBC 12.1* 11.1*  HGB 11.7* 10.3*  HCT 35.5* 29.8*  PLT 447* 535*   BMET:  Recent Labs    05/15/24 1113 05/18/24 0357  NA 127* 127*  K 4.1 3.7  CL 93* 91*  CO2 22 24  GLUCOSE 152* 105*  BUN 6* 5*  CREATININE 0.71 0.70  CALCIUM  8.7* 8.0*    PT/INR:  Lab Results  Component Value Date   INR 1.3 (H) 05/08/2024   INR 1.0 05/06/2024   ABG:  INR: Will add last result for INR, ABG once components are confirmed Will add last 4 CBG results once components are confirmed  Assessment/Plan:  1. CV - Previous a fib with RVR. Per  monitor, a fib with RVR last evening. SR this am.  He was given oral Amiodarone  once yesterday then put back on Amiodarone  drip because of going back into a fib with RVR. He is also on Midodrine  5 mg bid and Apixaban  5 mg bid. Will continue Amiodarone  drip for now. DCCV tentatively scheduled for Tuesday 09/02. 2.  Pulmonary - On room air. Encourage incentive spirometer 3.  Expected post op acute blood loss anemia - H and H this am 10.3 and 29.8 4. IV infiltration left forearm-some swelling (tender) but no erythema. Warm compresses and elevation to continue 5. Thrombocytosis-platelets this am up to 535,000 6. Persistent hyponatremia-has had post op. Sodium this am remains 127 7. Supplement potassium 8. Deconditioned-home PT/OT arranged  Kyla HERO ZimmermanPA-C 7:44 AM Patient seen and examined, agree with above Put back on IV amiodarone  last night for recurrent A fib Will go on Po amiodarone  again today- dc drip and do not restart Can use IV metoprolol  for rate control if needed On Eliquis   Alphonse Asbridge C. Kerrin, MD [facility] [phone]

## 2024-05-18 NOTE — Progress Notes (Signed)
 Pt went back into NSR at approximately 0600

## 2024-05-18 NOTE — Progress Notes (Addendum)
 Pt ambulated x 350 feet around the unit with front wheel walker, Hr  before ambulation 90's-110's, HR during 120's-130's and HR at cessation of activity 90's-110's

## 2024-05-18 NOTE — Progress Notes (Signed)
 Mobility Specialist Progress Note;   05/18/24 1032  Mobility  Activity Ambulated independently  Level of Assistance Contact guard assist, steadying assist  Assistive Device None  Distance Ambulated (ft) 470 ft  RUE Weight Bearing Per Provider Order WBAT  LUE Weight Bearing Per Provider Order WBAT  Activity Response Tolerated well  Mobility Referral Yes  Mobility visit 1 Mobility  Mobility Specialist Start Time (ACUTE ONLY) 1032  Mobility Specialist Stop Time (ACUTE ONLY) 1045  Mobility Specialist Time Calculation (min) (ACUTE ONLY) 13 min   Pt in chair upon arrival and eager for mobility. Required light MinG assistance during ambulation for safety. No c/o during session when asked. BP stable throughout. Pt returned to chair and left with all needs met, call bell in reach. RN and wife present.   Lauraine Erm Mobility Specialist Please contact via SecureChat or [employer] [phone]

## 2024-05-19 DIAGNOSIS — Z951 Presence of aortocoronary bypass graft: Secondary | ICD-10-CM | POA: Diagnosis not present

## 2024-05-19 DIAGNOSIS — I9789 Other postprocedural complications and disorders of the circulatory system, not elsewhere classified: Secondary | ICD-10-CM | POA: Diagnosis not present

## 2024-05-19 DIAGNOSIS — I4891 Unspecified atrial fibrillation: Secondary | ICD-10-CM | POA: Diagnosis not present

## 2024-05-19 LAB — BASIC METABOLIC PANEL WITH GFR
Anion gap: 9 (ref 5–15)
BUN: 6 mg/dL — ABNORMAL LOW (ref 8–23)
CO2: 29 mmol/L (ref 22–32)
Calcium: 8.4 mg/dL — ABNORMAL LOW (ref 8.9–10.3)
Chloride: 91 mmol/L — ABNORMAL LOW (ref 98–111)
Creatinine, Ser: 0.72 mg/dL (ref 0.61–1.24)
GFR, Estimated: >60 mL/min (ref >60)
Glucose, Bld: 96 mg/dL (ref 70–99)
Potassium: 3.7 mmol/L (ref 3.5–5.1)
Sodium: 129 mmol/L — ABNORMAL LOW (ref 135–145)

## 2024-05-19 LAB — CBC
HCT: 27.6 % — ABNORMAL LOW (ref 39.0–52.0)
Hemoglobin: 9.6 g/dL — ABNORMAL LOW (ref 13.0–17.0)
MCH: 34.3 pg — ABNORMAL HIGH (ref 26.0–34.0)
MCHC: 34.8 g/dL (ref 30.0–36.0)
MCV: 98.6 fL (ref 80.0–100.0)
Platelets: 575 K/uL — ABNORMAL HIGH (ref 150–400)
RBC: 2.8 MIL/uL — ABNORMAL LOW (ref 4.22–5.81)
RDW: 12.1 % (ref 11.5–15.5)
WBC: 10.7 K/uL — ABNORMAL HIGH (ref 4.0–10.5)
nRBC: 0 % (ref 0.0–0.2)

## 2024-05-19 LAB — MAGNESIUM: Magnesium: 1.9 mg/dL (ref 1.7–2.4)

## 2024-05-19 MED ORDER — CEPHALEXIN 500 MG PO CAPS
500.0000 mg | ORAL_CAPSULE | Freq: Three times a day (TID) | ORAL | Status: DC
  Administered 2024-05-19: 500 mg via ORAL
  Filled 2024-05-19: qty 1

## 2024-05-19 MED ORDER — MIDODRINE HCL 5 MG PO TABS: 5.0000 mg | ORAL_TABLET | Freq: Two times a day (BID) | ORAL | 0 refills | Status: DC

## 2024-05-19 MED ORDER — CEPHALEXIN 500 MG PO CAPS: 500.0000 mg | ORAL_CAPSULE | Freq: Three times a day (TID) | ORAL | 0 refills | Status: DC

## 2024-05-19 MED ORDER — APIXABAN 5 MG PO TABS: 5.0000 mg | ORAL_TABLET | Freq: Two times a day (BID) | ORAL | 1 refills | Status: DC

## 2024-05-19 MED ORDER — AMIODARONE HCL 400 MG PO TABS: ORAL_TABLET | ORAL | 1 refills | Status: DC

## 2024-05-19 MED ORDER — OXYCODONE HCL 5 MG PO TABS: 5.0000 mg | ORAL_TABLET | Freq: Four times a day (QID) | ORAL | 0 refills | Status: DC | PRN

## 2024-05-19 MED ORDER — ASPIRIN 81 MG PO TBEC: 81.0000 mg | DELAYED_RELEASE_TABLET | Freq: Every day | ORAL | Status: DC

## 2024-05-19 MED ORDER — METOPROLOL SUCCINATE ER 25 MG PO TB24: 12.5000 mg | ORAL_TABLET | Freq: Every day | ORAL | 1 refills | Status: DC

## 2024-05-19 MED ORDER — POTASSIUM CHLORIDE CRYS ER 20 MEQ PO TBCR
40.0000 meq | EXTENDED_RELEASE_TABLET | Freq: Once | ORAL | Status: AC
  Administered 2024-05-19: 40 meq via ORAL
  Filled 2024-05-19: qty 2

## 2024-05-19 MED ORDER — METOPROLOL SUCCINATE ER 25 MG PO TB24
12.5000 mg | ORAL_TABLET | Freq: Every day | ORAL | Status: DC
  Administered 2024-05-19: 12.5 mg via ORAL
  Filled 2024-05-19: qty 1

## 2024-05-19 MED ORDER — ACETAMINOPHEN 325 MG PO TABS: 650.0000 mg | ORAL_TABLET | Freq: Four times a day (QID) | ORAL | Status: AC | PRN

## 2024-05-19 MED ORDER — ATORVASTATIN CALCIUM 80 MG PO TABS: 80.0000 mg | ORAL_TABLET | Freq: Every day | ORAL | 1 refills | Status: DC

## 2024-05-19 NOTE — Progress Notes (Addendum)
 Cardiology Progress Note  Patient ID: Edgar Garcia MRN: [id number] DOB: 09-Jun-1951 Date of Encounter: 05/19/2024 Primary Cardiologist: Edgar JINNY Lawrence, MD  Subjective   Chief Complaint: None.   HPI: Remaining in sinus rhythm.  Reports she is weak in the hospital.  Ready to go home.  ROS:  All other ROS reviewed and negative. Pertinent positives noted in the HPI.     Telemetry  Overnight telemetry shows sinus rhythm 60 to 70 bpm, which I personally reviewed.    Physical Exam   Vitals:   05/18/24 2017 05/18/24 2252 05/19/24 0305 05/19/24 0551  BP: (!) 143/74 124/64 136/75   Pulse: 67 67 76   Resp: 19 14 19    Temp: 98.7 F (37.1 C) 98.5 F (36.9 C) 98.6 F (37 C)   TempSrc: Oral Oral Oral   SpO2: 98% 96% 95%   Weight:    75.5 kg  Height:        Intake/Output Summary (Last 24 hours) at 05/19/2024 0729 Last data filed at 05/19/2024 0305 Gross per 24 hour  Intake 350 ml  Output 1200 ml  Net -850 ml       05/19/2024    5:51 AM 05/18/2024    5:00 AM 05/17/2024    6:37 AM  Last 3 Weights  Weight (lbs) 166 lb 8 oz 166 lb 0.1 oz 166 lb 0.1 oz  Weight (kg) 75.524 kg 75.3 kg 75.3 kg    Body mass index is 24.95 kg/m.  General: Well nourished, well developed, in no acute distress Head: Atraumatic, normal size  Eyes: PEERLA, EOMI  Neck: Supple, no JVD Endocrine: No thryomegaly Cardiac: Normal S1, S2; RRR; no murmurs, rubs, or gallops Lungs: Clear to auscultation bilaterally, no wheezing, rhonchi or rales  Abd: Soft, nontender, no hepatomegaly  Ext: No edema, pulses 2+ Musculoskeletal: No deformities, BUE and BLE strength normal and equal Skin: Midline sternotomy scar, clean and dry Neuro: Alert and oriented to person, place, time, and situation, CNII-XII grossly intact, no focal deficits  Psych: Normal mood and affect   Cardiac Studies  TTE 04/15/2024  1. Left ventricular ejection fraction, by estimation, is 55 to 60%. The  left ventricle has normal function. The left  ventricle has no regional  wall motion abnormalities. Left ventricular diastolic parameters are  indeterminate.   2. Right ventricular systolic function is normal. The right ventricular  size is normal. There is mildly elevated pulmonary artery systolic  pressure.   3. The mitral valve is degenerative. Mild mitral valve regurgitation. No  evidence of mitral stenosis.   4. The aortic valve is calcified. Aortic valve regurgitation is not  visualized. Aortic valve sclerosis/calcification is present, without any  evidence of aortic stenosis.   5. Aortic dilatation noted. There is mild dilatation of the ascending  aorta, measuring 38 mm.   6. The inferior vena cava is normal in size with greater than 50%  respiratory variability, suggesting right atrial pressure of 3 mmHg.   Patient Profile/  Edgar Garcia is a 73 y.o. male with CAD, HLD, HTN, etoh abuse admitted 05/08/2024 for CABG. Course complicated by Postop Afib.   Assessment & Plan   # Postoperative A-fib - Suspect this is related to CABG surgery.  Plan is to continue 400 mg twice daily for 7 days followed by 400 mg daily for 7 days followed by 200 mg daily until seen in the cardiology office.  Will arrange follow-up with Dr. Patwarden.  - Continue Eliquis  5 mg twice  daily. - Would recommend metoprolol  to tartrate 25 mg as needed if he returns to A-fib for heart rates above 110 bpm.  # CAD status post CABG - Currently on aspirin .  Likely can drop aspirin  in the next few weeks after bypass surgery.  I will defer this to cardiac surgery.  For now remain on aspirin  and Eliquis . - Continue Lipitor  80 mg daily.  # Postoperative hypotension - CT surgery is weaning his midodrine .  Exam is normal.  # Hyponatremia Suspect this could be related to surgery.  Does not appear volume overloaded.  Seems to be improving.  [facility] will sign off.   The patient is ready for discharge today from a cardiac standpoint. Medication  Recommendations: As above Other recommendations (labs, testing, etc): He will need follow-up labs and to assess his need for aspirin  moving forward at follow-up visit Follow up as an outpatient: He has follow-up in office 06/02/2024.   For questions or updates, please contact [facility] Please consult [email or web address] for contact info under      Signed, Edgar T. Barbaraann, MD, [facility]  [facility]  05/19/2024 7:29 AM

## 2024-05-19 NOTE — Progress Notes (Signed)
 Mobility Specialist Progress Note:   05/19/24 0920  Mobility  Activity Ambulated with assistance  Level of Assistance Standby assist, set-up cues, supervision of patient - no hands on  Assistive Device None  Distance Ambulated (ft) 470 ft  RUE Weight Bearing Per Provider Order WBAT  LUE Weight Bearing Per Provider Order WBAT  Activity Response Tolerated well  Mobility Referral Yes  Mobility visit 1 Mobility  Mobility Specialist Start Time (ACUTE ONLY) 0940  Mobility Specialist Stop Time (ACUTE ONLY) 0951  Mobility Specialist Time Calculation (min) (ACUTE ONLY) 11 min   Pt agreeable to mobility session. Required no physical assistance throughout ambulation with no AD use. No unsteadiness noted. VSS throughout, HR NSR. Pt back in chair with all needs met.   Therisa Rana Mobility Specialist Please contact via SecureChat or  Rehab office at [phone]

## 2024-05-19 NOTE — Plan of Care (Signed)
  Problem: Health Behavior/Discharge Planning: Goal: Ability to manage health-related needs will improve Outcome: Adequate for Discharge   Problem: Education: Goal: Knowledge of General Education information will improve Description: Including pain rating scale, medication(s)/side effects and non-pharmacologic comfort measures Outcome: Adequate for Discharge   Problem: Clinical Measurements: Goal: Ability to maintain clinical measurements within normal limits will improve Outcome: Adequate for Discharge Goal: Will remain free from infection Outcome: Adequate for Discharge Goal: Diagnostic test results will improve Outcome: Adequate for Discharge Goal: Respiratory complications will improve Outcome: Adequate for Discharge Goal: Cardiovascular complication will be avoided Outcome: Adequate for Discharge   Problem: Safety: Goal: Ability to remain free from injury will improve Outcome: Adequate for Discharge   Problem: Cardiac: Goal: Will achieve and/or maintain hemodynamic stability Outcome: Adequate for Discharge   Problem: Activity: Goal: Risk for activity intolerance will decrease Outcome: Adequate for Discharge   Problem: Urinary Elimination: Goal: Ability to achieve and maintain adequate renal perfusion and functioning will improve Outcome: Adequate for Discharge   Problem: Skin Integrity: Goal: Wound healing without signs and symptoms of infection Outcome: Adequate for Discharge Goal: Risk for impaired skin integrity will decrease Outcome: Adequate for Discharge

## 2024-05-19 NOTE — Progress Notes (Signed)
 Physical Therapy Treatment Patient Details Name: Edgar Garcia MRN: [id number] DOB: 01-03-1951 Today's Date: 05/19/2024   History of Present Illness 73 yo M presented 8/21 for off pump CABG x 2 . PMH: CAD + prior unsuccessful PCI c/b coronary dissection and brief VF arrest, HTN, HLD.    PT Comments  Pt received in chair, spouse present at beginning of session, discussion on plan for DC home and pt pleasantly agreeable to final PT session to review standing HEP and stair negotiation for spouse guarding instruction and strengthening. Pt performs step up/down x5 reps on 4 platform in room with only light CGA via HHA for safety but no imbalance and short gait trial without AD for BLE strengthening as well as reciprocal STS without using arms from chair for strengthening. Also verbal/visual demo for standing HEP and safety instruction. Pt continues to benefit from PT services to progress toward functional mobility goals, VSS this date prior to and during activity.    If plan is discharge home, recommend the following: A little help with walking and/or transfers;A little help with bathing/dressing/bathroom;Assistance with cooking/housework;Assist for transportation;Help with stairs or ramp for entrance   Can travel by private vehicle        Equipment Recommendations  None recommended by PT (RW vs rollator if pt agrees; pt not yet decided)    Recommendations for Other Services       Precautions / Restrictions Precautions Precautions: Sternal Precaution Booklet Issued: Yes (comment) Recall of Precautions/Restrictions: Intact Precaution/Restrictions Comments: compliant this date with precautions Restrictions Weight Bearing Restrictions Per Provider Order: No Other Position/Activity Restrictions: Sternal precautions     Mobility  Bed Mobility Overal bed mobility: Needs Assistance             General bed mobility comments: pt received/remains in chair at end of session     Transfers Overall transfer level: Needs assistance Equipment used: None Transfers: Sit to/from Stand Sit to Stand: Supervision           General transfer comment: initial cues for advancing hips before standing for ease of transfer; pt also performs STS x 5 from cushioned chair height (pillow) with arms crossed at chest for HEP instruction without physical assist/Supervision for safety only.    Ambulation/Gait Ambulation/Gait assistance: Supervision Gait Distance (Feet): 100 Feet Assistive device: None Gait Pattern/deviations: Step-through pattern, WFL(Within Functional Limits) Gait velocity: dec     General Gait Details: HR 80's bpm, SpO2 92-93% on RA with exertion; min cues for wider stance for improved stability but no drift or imbalance this date, slower pace as pt being cautious. Discussed him speaking with HHPT about initiating a walking program to slowly progress strength/endurance as able. Pt defers RW but PTA discussed with him using it when by himself at home due to intermittent lightheadedness/pre-syncopal episode as recently as last Fri and chronic low sodium which can cause increased risk of orthostatic hypotension.   Stairs Stairs: Yes Stairs assistance: Contact guard assist Stair Management: No rails, Forwards, Backwards, Step to pattern Number of Stairs: 5 General stair comments: lower 4 step x5 reps in room, x3 forward/backward stepping then final 2 reps forward up/down pattern, CGA via HHA for safety and spouse present for teachback on guarding position for stairs. No buckling or LOB via forward step up/down and step-to pattern   Wheelchair Mobility     Tilt Bed    Modified Rankin (Stroke Patients Only)       Balance Overall balance assessment: Needs assistance Sitting-balance support: No  upper extremity supported, Feet supported Sitting balance-Leahy Scale: Good     Standing balance support: No upper extremity supported, Bilateral upper  extremity supported Standing balance-Leahy Scale: Fair Standing balance comment: no overt LOB                            Communication Communication Communication: No apparent difficulties  Cognition Arousal: Alert Behavior During Therapy: WFL for tasks assessed/performed   PT - Cognitive impairments: No apparent impairments                       PT - Cognition Comments: Spouse present at beginning of session for instruction on guarding techniques with stairs; she left to pick up his clothing from car for him to get dressed for DC today. Following commands: Intact      Cueing Cueing Techniques: Verbal cues, Visual cues  Exercises Other Exercises Other Exercises: STS x 5 reps reciprocal with arms crossed at chest, discussed 5-10 reps a few times daily for strengthening Other Exercises: visual/verbal demo for standing HEP including mini squats, hip flexion with high knees and heel raises x10 reps TID as tolerated with fingertips on countertop for safety/stability and chair behind him in case he needs to sit and rest. Pt defers to demo back standing HEP to conserve energy for DC home soon.    General Comments General comments (skin integrity, edema, etc.): see gait comments; VSS on RA, SpO2 92% and above on RA.      Pertinent Vitals/Pain Pain Assessment Pain Assessment: No/denies pain Pain Intervention(s): Monitored during session, Repositioned    Home Living                          Prior Function            PT Goals (current goals can now be found in the care plan section) Acute Rehab PT Goals Patient Stated Goal: get well PT Goal Formulation: With patient Time For Goal Achievement: 05/24/24 Progress towards PT goals: Progressing toward goals    Frequency    Min 2X/week      PT Plan      Co-evaluation              AM-PAC PT 6 Clicks Mobility   Outcome Measure  Help needed turning from your back to your side while in a  flat bed without using bedrails?: None Help needed moving from lying on your back to sitting on the side of a flat bed without using bedrails?: None Help needed moving to and from a bed to a chair (including a wheelchair)?: A Little Help needed standing up from a chair using your arms (e.g., wheelchair or bedside chair)?: A Little Help needed to walk in hospital room?: A Little Help needed climbing 3-5 steps with a railing? : A Little 6 Click Score: 20    End of Session Equipment Utilized During Treatment: Gait belt Activity Tolerance: Patient tolerated treatment well Patient left: with call bell/phone within reach;Other (comment);with family/visitor present;in chair (spouse returning to his room with his clothing just after session ended, RN aware.) Nurse Communication: Mobility status;Other (comment) (spouse returning to his room with his clothing so he can DC; primary RN notified) PT Visit Diagnosis: Unsteadiness on feet (R26.81);Other abnormalities of gait and mobility (R26.89);Muscle weakness (generalized) (M62.81);History of falling (Z91.81);Difficulty in walking, not elsewhere classified (R26.2);Pain     Time: 1049 IN (  PTA left room [phone] to obtain equipment and while nursing staff reviewing DC paperwork and came back to finish instruction)-1123 OUT PT Time Calculation (min) (ACUTE ONLY): 34 min (18 mins total in room education)  Charges:    $Therapeutic Exercise: 8-22 mins PT General Charges $$ ACUTE PT VISIT: 1 Visit                     Sofiah Lyne P., PTA Acute Rehabilitation Services Secure Chat Preferred 9a-5:30pm Office: [phone]    Connell HERO [facility] 05/19/2024, 11:45 AM

## 2024-05-19 NOTE — Progress Notes (Addendum)
                  [address]           [place], [place] [address]                     [phone]        11 Days Post-Op Procedure(s) (LRB): OFF PUMP CORONARY ARTERY BYPASS GRAFTING (CABG) TIMES TWO, USING LEFT INTERNAL MAMMARY ARTERY AND RIGHT LEG GREATER SAPHENOUS VEIN HARVESTED ENDOSCOPICALLY (N/A) ECHOCARDIOGRAM, TRANSESOPHAGEAL (N/A)  Subjective: Feels as well as I have since I have been here  Objective: Vital signs in last 24 hours: Temp:  [98.2 F (36.8 C)-98.7 F (37.1 C)] 98.6 F (37 C) (09/01 0305) Pulse Rate:  [67-77] 76 (09/01 0305) Cardiac Rhythm: Normal sinus rhythm (08/31 1900) Resp:  [12-20] 19 (09/01 0305) BP: (120-155)/(64-78) 136/75 (09/01 0305) SpO2:  [95 %-100 %] 95 % (09/01 0305) Weight:  [75.5 kg] 75.5 kg (09/01 0551)  Pre op weight 75.1 kg Current Weight  05/19/24 75.5 kg      Intake/Output from previous day: 08/31 0701 - 09/01 0700 In: 350 [P.O.:350] Out: 1200 [Urine:1200]   Physical Exam:  Cardiovascular: RRR Pulmonary: Clear to auscultation bilaterally Abdomen: Soft, non tender, bowel sounds present. Extremities: No lower extremity edema. Bilateral forearm swelling, erythema and some tenderness R>L (IV amiodarone , infiltrated IV) Wounds: Sternal and RLE wounds are clean and dry.  No erythema or signs of infection.  Lab Results: CBC: Recent Labs    05/18/24 0357 05/19/24 0409  WBC 11.1* 10.7*  HGB 10.3* 9.6*  HCT 29.8* 27.6*  PLT 535* 575*   BMET:  Recent Labs    05/18/24 0357 05/19/24 0409  NA 127* 129*  K 3.7 3.7  CL 91* 91*  CO2 24 29  GLUCOSE 105* 96  BUN 5* 6*  CREATININE 0.70 0.72  CALCIUM  8.0* 8.4*    PT/INR:  Lab Results  Component Value Date   INR 1.3 (H) 05/08/2024   INR 1.0 05/06/2024   ABG:  INR: Will add last result for INR, ABG once components are confirmed Will add last 4 CBG results once components are confirmed  Assessment/Plan:  1. CV - Previous a fib with RVR. SR  this am. He is on Amiodarone  400 mg bid, Midodrine  5 mg bid and Apixaban  5 mg bid. SBP more above 130's + so as discussed with Dr. Kerrin, will stop Midodrine  and start low dose Toprol  XL 12.5 mg daily. 2.  Pulmonary - On room air. Encourage incentive spirometer 3.  Expected post op acute blood loss anemia - H and H this am slightly decreased to 9.6 and 27.6 4. Phlebitis right and left forearm- swelling (tender) and minor erythema on left and worse on the right. Will start Kelfex. Warm compresses and elevation to continue 5. Thrombocytosis-platelets this am up to 575,000 6. Persistent hyponatremia-has had post op. Sodium this am slightly increased to 120 7. Supplement potassium 8. Deconditioned-home PT/OT arranged.Discharge later today  Kyla HERO ZimmermanPA-C 7:35 AM  Patient seen and examined, agree with plan as outlined above  Elspeth C. Kerrin, MD [facility] [phone]

## 2024-05-20 ENCOUNTER — Telehealth: Payer: Self-pay | Admitting: Cardiology

## 2024-05-20 SURGERY — TRANSESOPHAGEAL ECHOCARDIOGRAM (TEE) (CATHLAB)
Anesthesia: Monitor Anesthesia Care

## 2024-05-20 NOTE — Telephone Encounter (Signed)
 Pt has questions about two of his medications. Was unable to get out of chair to tell me which ones they were. Please advise.

## 2024-05-20 NOTE — Telephone Encounter (Signed)
 Please review and advise upon review of hospital records.

## 2024-05-20 NOTE — Telephone Encounter (Signed)
 Patient states he had some concerns about the medications prescribed to him following his most recent hospitalization.  He states at one point, in the past, he was switched from metoprolol  to atenolol  due to dizziness. Patient states he will take metoprolol  as prescribed and see how he does on it.  Patient requested for Dr. Elmira to review his recent hospitalization and medication list and let him know if there is anything he doesn't agree with or recommends he do differently.  Patient has F/U appt with APP on 9/15.

## 2024-05-21 ENCOUNTER — Telehealth (HOSPITAL_COMMUNITY): Payer: Self-pay | Admitting: *Deleted

## 2024-05-21 NOTE — Telephone Encounter (Signed)
 Received referral from Dr. Elmira for this pt to participate in Outpatient Cardiac Rehab phase II s/p 8/21 CABG x 2.  Left message requesting call back.  Contact information provided. Saturnino Bernett PEAK, BSN Cardiac and Emergency planning/management officer

## 2024-05-21 NOTE — Addendum Note (Signed)
 Addendum  created 05/21/24 1521 by Maryclare Cornet, MD   Attestation recorded in Intraprocedure, Intraprocedure Attestations filed

## 2024-05-21 NOTE — Telephone Encounter (Signed)
 That is correct. We can change back to atenolol  if patient has side effect of dizziness on metoprolol .  Thanks MJP

## 2024-05-23 ENCOUNTER — Ambulatory Visit
Attending: Thoracic Surgery (Cardiothoracic Vascular Surgery) | Admitting: Thoracic Surgery (Cardiothoracic Vascular Surgery)

## 2024-05-23 ENCOUNTER — Ambulatory Visit: Admitting: Cardiology

## 2024-05-23 ENCOUNTER — Ambulatory Visit: Payer: Self-pay | Admitting: Thoracic Surgery (Cardiothoracic Vascular Surgery)

## 2024-05-23 ENCOUNTER — Telehealth: Payer: Self-pay | Admitting: *Deleted

## 2024-05-23 DIAGNOSIS — Z951 Presence of aortocoronary bypass graft: Secondary | ICD-10-CM

## 2024-05-23 MED ORDER — FUROSEMIDE 40 MG PO TABS: 40.0000 mg | ORAL_TABLET | Freq: Every day | ORAL | 0 refills | Status: DC

## 2024-05-23 NOTE — Telephone Encounter (Signed)
 Attempted to reach pt back in regards to his constipation, no answer, lvm  Live Answer: When patient returns call, please communicate the follow information:  Live Answer can communication the following information: I recommend using 1 capful of MiraLAX  or Dulcolax daily.  He can redose Dulcolax today.  If he is passing gas, this is not an emergency, but if he is not able to pass gas since his surgery, he does need to go to the ER.  If constipation is not relieved by MiraLAX /Dulcolax, milk of magnesia/prune juice can be used, but these are much more likely to cause diarrhea that can lead to dehydration, so use with caution.

## 2024-05-23 NOTE — Progress Notes (Signed)
     [address]       [place] [address]             [phone]       Patient: Home Provider: Office Consent for Telemedicine visit obtained.  Today's visit was completed via a real-time telehealth (see specific modality noted below). The patient/authorized person provided oral consent at the time of the visit to engage in a telemedicine encounter with the present provider at [facility]. The patient/authorized person was informed of the potential benefits, limitations, and risks of telemedicine. The patient/authorized person expressed understanding that the laws that protect confidentiality also apply to telemedicine. The patient/authorized person acknowledged understanding that telemedicine does not provide emergency services and that he or she would need to call 911 or proceed to the nearest hospital for help if such a need arose.   Total time spent in the clinical discussion 10 minutes.  Telehealth Modality: Phone visit (audio only)  I had a telephone visit with  Edgar Garcia who is s/p CABG.  Overall doing well.  Pain is minimal.  Ambulating well. Vitals have been stable.  Edgar Garcia will see us  back in 1 month with a chest x-ray for cardiac rehab clearance.  Pelagia Iacobucci MALVA Rayas

## 2024-05-23 NOTE — Telephone Encounter (Signed)
 Patient contacted the office regarding constipation. States he has not been able to go since hospital discharge on Monday. In the interim of calling the patient back, he had a BM. States he is feeling better. States he was worried about his morning BP of 195/101. States this was taken during moments of abdominal cramping. Since having a BM and relaxing, his current BP is 149/82. Patient advised of a bowel regimen. He has a telephone encounter with Dr. Shyrl this afternoon.

## 2024-05-23 NOTE — Telephone Encounter (Signed)
 I recommend using 1 capful of MiraLAX  or Dulcolax daily.  He can redose Dulcolax today.  If he is passing gas, this is not an emergency, but if he is not able to pass gas since his surgery, he does need to go to the ER.  If constipation is not relieved by MiraLAX /Dulcolax, milk of magnesia/prune juice can be used, but these are much more likely to cause diarrhea that can lead to dehydration, so use with caution.

## 2024-05-23 NOTE — Progress Notes (Signed)
     [address]       [place] [address]             [phone]       Patient: Home Provider: Office Consent for Telemedicine visit obtained.  Today's visit was completed via a real-time telehealth (see specific modality noted below). The patient/authorized person provided oral consent at the time of the visit to engage in a telemedicine encounter with the present provider at [facility]. The patient/authorized person was informed of the potential benefits, limitations, and risks of telemedicine. The patient/authorized person expressed understanding that the laws that protect confidentiality also apply to telemedicine. The patient/authorized person acknowledged understanding that telemedicine does not provide emergency services and that he or she would need to call 911 or proceed to the nearest hospital for help if such a need arose.   Total time spent in the clinical discussion 10 minutes.  Telehealth Modality: Phone visit (audio only)  I had a telephone visit with  [name] who is s/p CABG.  Overall doing well.  Pain is minimal.  Ambulating well. Vitals have been stable.  [name] will see us  back in 1 month with a chest x-ray for cardiac rehab clearance.  Pelagia Iacobucci MALVA Rayas

## 2024-05-23 NOTE — Telephone Encounter (Signed)
 Spoke with pt, he reports he is not having any dizziness from the metoprolol . He is going to continue the metoprolol  for now. He will bring his blood pressure log and blood pressure machine to his follow up appointment 9/15.

## 2024-05-23 NOTE — Addendum Note (Signed)
 Addended by: Derrien Anschutz O on: 05/23/2024 02:53 PM   Modules accepted: Orders

## 2024-05-23 NOTE — Telephone Encounter (Signed)
 Left message for pt to call.

## 2024-05-26 NOTE — Telephone Encounter (Signed)
 Noted.  Thanks MJP

## 2024-05-26 NOTE — Telephone Encounter (Signed)
 fyi

## 2024-05-30 ENCOUNTER — Telehealth: Payer: Self-pay | Admitting: Gastroenterology

## 2024-05-30 NOTE — Telephone Encounter (Signed)
 Received a call from patient stating he is experiencing aggressive discomfort,  can't use the bathroom, bowel movements abnormal. It has been ongoing, patient is requesting a nurse fu call to discuss solutions. Please review and advise  Thank you

## 2024-05-30 NOTE — Telephone Encounter (Signed)
 Pt reports he is 3 weeks out from open heart surgery. He has had issues with constipation. He took a dulcolax and miralax  and had a large bm a week ago. About 3-4 days ago he passed a small amt of stool. Now he reports he is leaking liquid stool. Discussed with him it is probably liquid stool leaking around some hard stool. Discussed with pt that he can try an enema followed by a miralax  purge of 7 doses of miralax  mixed with 4 cups of gatorade, drink 1 cup every 15min until gone. Pt verbalized understanding and will try these suggestions. Also let him know once he has BM he can start taking miralax  1-3 doses daily as needed to have BM.

## 2024-06-01 NOTE — Progress Notes (Signed)
 Cardiology Office Note   Date:  06/02/2024  ID:  Edgar Garcia, DOB 07-Sep-1951, MRN [id number] PCP: Leila Lucie LABOR, MD  [facility] Providers Cardiologist:  [name], MD    History of Present Illness Edgar Garcia is a 73 y.o. male with a past medical history of hypertension, hyperlipidemia, prediabetes, prior history of heavy alcohol use, now with CAD status post unsuccessful PCI complicated by coronary dissection and periprocedural MI (07/26/2021), colon polyps without active bleeding.   Patient was seen for an urgent visit.  Had 2 syncopal episodes the day before.  Dulcolax as usual, drink water, and ate a meal.  At 430 he was working at his art bench when he had a sudden abdominal cramping, dizziness, followed by loss of consciousness.  He woke up feeling better and drove himself home.  He went home after sitting in a chair bathroom urinated once following he had some dizziness.  He called his wife for help but did not lose consciousness at time.  Regained consciousness within a few seconds.  Patient has similar syncopal episode back in 2022 or so with no long-term consequences.  Denied any chest pain, shortness of breath, palpitations before his syncope episode.  Returned back to his normal self since then.  Patient was scheduled for two-vessel CABG with Dr. Shyrl on Thursday, 05/08/2024.  He presented to [facility] 05/08/2024 and underwent an off-pump CABG x 2 utilizing LIMA to LAD, and SVG to diagonal as well as endoscopic harvesting of the right greater saphenous vein.  Tolerated procedure well.  Continue to have some dizziness with transitioning out of bed to the felt this was related to orthostatic blood pressure changes.  Started on oral midodrine  postop.  Unfortunately his postop period was complicated by atrial fibrillation with RVR.  IV amiodarone  protocol was initiated.  Cardiology discontinued his.  Patient became hypotensive and presyncopal and  midodrine  was restarted and Lopressor  was discontinued as recommended by Dr. Coley.  Ultimately the patient converted to normal sinus rhythm.  Was discharged on amiodarone  400 mg twice daily with a taper as well as apixaban  5 mg twice daily and midodrine  5 mg twice daily.  Today, he presents for postoperative follow-up after recent CABG X 2.  He experiences burning and soreness in the chest area, particularly when getting in and out of bed at night. The pain improves in the morning and worsens with certain activities. He is cautious about his movements to avoid exacerbating the pain.  During his hospital stay, he experienced postoperative atrial fibrillation lasting 12 days, treated with an amiodarone  drip, converting him to normal rhythm before discharge. He is currently on a half tablet of amiodarone  and a half tablet of metoprolol  daily, along with Eliquis .  He no longer experiences lightheadedness or dizziness since discontinuing medications such as amlodipine , lisinopril , and metoprolol . He is currently only on a half tablet of metoprolol .  He monitors his blood pressure at home, noting a range from 125 to 145 systolic and generally over 70 diastolic. He is concerned about the higher readings compared to when he was on previous medications.  He is on atorvastatin  80 mg following his surgery to prevent further plaque buildup. His LDL was 23 on the last check. He has a strong family history of coronary artery disease, with his father, grandfather, and brother having similar issues.  He experiences swelling in his right foot, particularly when putting on shoes, which has improved with Lasix . He notes frequent urination,  especially at night, despite taking Lasix  in the morning. He has gained 5 to 7 pounds post-surgery but is nearing his baseline weight.  ROS: pertinent ROS in HPI  Studies Reviewed      LEFT HEART CATH AND CORONARY ANGIOGRAPHY      Prox LAD lesion is 70% stenosed.   Prox  RCA lesion is 90% stenosed.   Mid LAD lesion is 99% stenosed.   LV end diastolic pressure is mildly elevated.   Recommend Aspirin  81mg  daily for moderate CAD.   Coronary angiography 02/28/2024: LM: Normal LAD: Severely calcified and tortuous vessel          Proximal diffuse 70% disease          Mid 99% stenosis (likely healed dissection from previously unsuccessful PCI attempt)          Distal vessel gets antegrade flow through a small channel Lcx: Large dominant vessel, no significant disease RCA: Small nondominant vessel, proximal 80% stenosis   LVEDP 26 mmHg        ECHOCARDIOGRAM REPORT       Patient Name:   Edgar Garcia  Date of Exam: 04/15/2024  Medical Rec #:  [id number]     Height:       69.0 in  Accession #:    [id number]    Weight:       170.0 lb  Date of Birth:  03-26-1951      BSA:          1.928 m  Patient Age:    73 years      BP:           144/80 mmHg  Patient Gender: M             HR:           75 bpm.  Exam Location:  [address]   Procedure: 2D Echo, Cardiac Doppler and Color Doppler (Both Spectral and  Color            Flow Doppler were utilized during procedure).   Indications:    I25.10 CAD    History:        Patient has prior history of Echocardiogram examinations,  most                 recent 10/13/2021. CAD and Previous Myocardial Infarction,  TIA;                 Risk Factors:Hypertension and Dyslipidemia.    Sonographer:    Carl Coma RDCS  Referring Phys: [id number] [facility] J PATWARDHAN   IMPRESSIONS     1. Left ventricular ejection fraction, by estimation, is 55 to 60%. The  left ventricle has normal function. The left ventricle has no regional  wall motion abnormalities. Left ventricular diastolic parameters are  indeterminate.   2. Right ventricular systolic function is normal. The right ventricular  size is normal. There is mildly elevated pulmonary artery systolic  pressure.   3. The mitral valve is degenerative. Mild mitral  valve regurgitation. No  evidence of mitral stenosis.   4. The aortic valve is calcified. Aortic valve regurgitation is not  visualized. Aortic valve sclerosis/calcification is present, without any  evidence of aortic stenosis.   5. Aortic dilatation noted. There is mild dilatation of the ascending  aorta, measuring 38 mm.   6. The inferior vena cava is normal in size with greater than 50%  respiratory variability, suggesting right atrial pressure of 3 mmHg.  FINDINGS   Left Ventricle: Left ventricular ejection fraction, by estimation, is 55  to 60%. The left ventricle has normal function. The left ventricle has no  regional wall motion abnormalities. The left ventricular internal cavity  size was normal in size. There is   no left ventricular hypertrophy. Left ventricular diastolic parameters  are indeterminate.   Right Ventricle: The right ventricular size is normal. No increase in  right ventricular wall thickness. Right ventricular systolic function is  normal. There is mildly elevated pulmonary artery systolic pressure. The  tricuspid regurgitant velocity is 3.09   m/s, and with an assumed right atrial pressure of 3 mmHg, the estimated  right ventricular systolic pressure is 41.2 mmHg.   Left Atrium: Left atrial size was normal in size.   Right Atrium: Right atrial size was normal in size.   Pericardium: There is no evidence of pericardial effusion.   Mitral Valve: The mitral valve is degenerative in appearance. Mild mitral  valve regurgitation. No evidence of mitral valve stenosis.   Tricuspid Valve: The tricuspid valve is normal in structure. Tricuspid  valve regurgitation is not demonstrated. No evidence of tricuspid  stenosis.   The aortic valve is calcified. Aortic valve regurgitation is not  visualized. Aortic valve sclerosis/calcification is present, without any  evidence of aortic stenosis.  Pulmonic Valve: The pulmonic valve was normal in structure. Pulmonic  valve  regurgitation is not visualized. No evidence of pulmonic stenosis.   Aorta: Aortic dilatation noted. There is mild dilatation of the ascending  aorta, measuring 38 mm.   Venous: The inferior vena cava is normal in size with greater than 50%  respiratory variability, suggesting right atrial pressure of 3 mmHg.   IAS/Shunts: No atrial level shunt detected by color flow Doppler.     LEFT VENTRICLE  PLAX 2D  LVIDd:         4.60 cm   Diastology  LVIDs:         3.20 cm   LV e' medial:    6.58 cm/s  LV PW:         0.70 cm   LV E/e' medial:  11.6  LV IVS:        0.70 cm   LV e' lateral:   11.85 cm/s  LVOT diam:     2.10 cm   LV E/e' lateral: 6.5  LV SV:         75  LV SV Index:   39  LVOT Area:     3.46 cm     RIGHT VENTRICLE             IVC  RV Basal diam:  4.10 cm     IVC diam: 1.10 cm  RV S prime:     11.65 cm/s  TAPSE (M-mode): 2.0 cm   LEFT ATRIUM             Index        RIGHT ATRIUM           Index  LA diam:        3.70 cm 1.92 cm/m   RA Area:     13.50 cm  LA Vol (A2C):   51.5 ml 26.71 ml/m  RA Volume:   31.60 ml  16.39 ml/m  LA Vol (A4C):   40.4 ml 20.95 ml/m  LA Biplane Vol: 49.8 ml 25.83 ml/m   AORTIC VALVE  AV Area (Vmax):    1.63 cm  AV Area (Vmean):  1.60 cm  AV Area (VTI):     1.63 cm  AV Vmax:           194.00 cm/s  AV Vmean:          131.600 cm/s  AV VTI:            0.461 m  AV Peak Grad:      15.1 mmHg  AV Mean Grad:      8.2 mmHg  LVOT Vmax:         91.35 cm/s  LVOT Vmean:        60.900 cm/s  LVOT VTI:          0.216 m  LVOT/AV VTI ratio: 0.47    AORTA  Ao Root diam: 3.50 cm  Ao Asc diam:  3.80 cm   MITRAL VALVE               TRICUSPID VALVE  MV Area (PHT): 3.53 cm    TR Peak grad:   38.2 mmHg  MV Decel Time: 215 msec    TR Vmax:        309.00 cm/s  MV E velocity: 76.50 cm/s  MV A velocity: 70.70 cm/s  SHUNTS  MV E/A ratio:  1.08        Systemic VTI:  0.22 m                             Systemic Diam: 2.10 cm   Kardie Tobb DO   Electronically signed by Dub Huntsman DO  Signature Date/Time: 04/15/2024/12:59:27 PM     Physical Exam VS:  BP (!) 156/76   Pulse 75   Ht 5' 8.5 (1.74 m)   Wt 168 lb (76.2 kg)   SpO2 97%   BMI 25.17 kg/m        Wt Readings from Last 3 Encounters:  06/02/24 168 lb (76.2 kg)  05/19/24 166 lb 8 oz (75.5 kg)  05/06/24 169 lb 6.4 oz (76.8 kg)    GEN: Well nourished, well developed in no acute distress NECK: No JVD; No carotid bruits, small presumed hematoma under the skin on right neck near central line insertion site CARDIAC: RRR, no murmurs, rubs, gallops RESPIRATORY:  Clear to auscultation without rales, wheezing or rhonchi  ABDOMEN: Soft, non-tender, non-distended EXTREMITIES:  No edema; No deformity   ASSESSMENT AND PLAN  Postoperative management of coronary artery bypass grafting (CABG) for atherosclerotic heart disease Postoperative burning and soreness in the chest due to nerve endings affected during sternotomy incision and left internal mammary artery harvest. Symptoms improving. - Advise limiting pushing and pulling movements. - Encourage walking as tolerated. - Plan for chest x-ray at surgical follow-up.  Postoperative atrial fibrillation, resolved, on amiodarone  and apixaban  Resolved postoperative atrial fibrillation. Converted to normal sinus rhythm on amiodarone  drip. Currently on amiodarone  and apixaban  as a preventative measure. High likelihood of not returning to AFib post-conversion. - Continue amiodarone  for 6-8 weeks post-surgery. - Continue apixaban  for 3 months post-surgery. - Plan to reassess medication need at surgical follow-up.  Postoperative chest wall pain and paresthesia Postoperative chest wall pain and paresthesia due to affected nerve endings from sternotomy incision. Symptoms expected to improve over time. - Advise on pain management strategies. - Reassure regarding normalcy of symptoms.  Peripheral edema, right lower extremity, post vein  harvest, improving Mild peripheral edema in right lower extremity post vein harvest. Currently on Lasix , which has improved swelling. -  Use Lasix  as needed for swelling. - Monitor weight and swelling. -He is back to his baseline without any LE edema today  Essential hypertension, post-CABG, on reduced antihypertensive regimen Blood pressure ranges from 125-145/70 mmHg. Previously on multiple antihypertensives, now reduced to metoprolol . Blood pressure acceptable but may require adjustment if consistently above 145 mmHg systolic. - Continue metoprolol . - Monitor blood pressure at home. - Report if systolic BP consistently above 145 mmHg.  Mixed hyperlipidemia, on high-intensity statin therapy On high-dose statin (atorvastatin  80 mg) post-CABG to prevent further plaque buildup. LDL well-controlled at 23 mg/dL. - Continue atorvastatin  80 mg daily.    Cardiac Rehabilitation Eligibility Assessment  The patient is ready to start cardiac rehabilitation pending clearance from the cardiac surgeon.     Dispo:  He can keep his scheduled follow-up appointment.   Signed, Orren LOISE Fabry, PA-C

## 2024-06-02 ENCOUNTER — Encounter: Payer: Self-pay | Admitting: Physician Assistant

## 2024-06-02 ENCOUNTER — Ambulatory Visit: Attending: Physician Assistant | Admitting: Physician Assistant

## 2024-06-02 VITALS — BP 156/76 | HR 75 | Ht 68.5 in | Wt 168.0 lb

## 2024-06-02 DIAGNOSIS — R55 Syncope and collapse: Secondary | ICD-10-CM | POA: Diagnosis not present

## 2024-06-02 DIAGNOSIS — E871 Hypo-osmolality and hyponatremia: Secondary | ICD-10-CM | POA: Diagnosis not present

## 2024-06-02 DIAGNOSIS — I25118 Atherosclerotic heart disease of native coronary artery with other forms of angina pectoris: Secondary | ICD-10-CM | POA: Diagnosis not present

## 2024-06-02 DIAGNOSIS — I10 Essential (primary) hypertension: Secondary | ICD-10-CM

## 2024-06-02 DIAGNOSIS — I2510 Atherosclerotic heart disease of native coronary artery without angina pectoris: Secondary | ICD-10-CM | POA: Diagnosis not present

## 2024-06-02 DIAGNOSIS — E782 Mixed hyperlipidemia: Secondary | ICD-10-CM

## 2024-06-02 NOTE — Patient Instructions (Signed)
 Thank you for choosing [facility]!     Medication Instructions:  STOP the Lasix   Your provider recommends that you continue on your current medications as directed. Please refer to the Current Medication list given to you today.   Monitor your blood pressure. If you see blood pressure above 145 (top number), please call the office.   *If you need a refill on your cardiac medications before your next appointment, please call your pharmacy*   Lab Work: No labs were ordered during today's visit.  If you have labs (blood work) drawn today and your tests are completely normal, you will receive your results only by: MyChart Message (if you have MyChart) OR A paper copy in the mail If you have any lab test that is abnormal or we need to change your treatment, we will call you to review the results.   Testing/Procedures: No procedures were ordered during today's visit.      Follow-Up: At [facility], you and your health needs are our priority.  As part of our continuing mission to provide you with exceptional heart care, we have created designated Provider Care Teams.  These Care Teams include your primary Cardiologist (physician) and Advanced Practice Providers (APPs -  Physician Assistants and Nurse Practitioners) who all work together to provide you with the care you need, when you need it. We recommend signing up for the patient portal called MyChart.  Sign up information is provided on this After Visit Summary.  MyChart is used to connect with patients for Virtual Visits (Telemedicine).  Patients are able to view lab/test results, encounter notes, upcoming appointments, etc.  Non-urgent messages can be sent to your provider as well.   To learn more about what you can do with MyChart, go to [email or web address].

## 2024-06-17 ENCOUNTER — Other Ambulatory Visit: Payer: Self-pay | Admitting: Gastroenterology

## 2024-06-17 DIAGNOSIS — K259 Gastric ulcer, unspecified as acute or chronic, without hemorrhage or perforation: Secondary | ICD-10-CM

## 2024-06-17 DIAGNOSIS — D500 Iron deficiency anemia secondary to blood loss (chronic): Secondary | ICD-10-CM

## 2024-06-26 ENCOUNTER — Other Ambulatory Visit: Payer: Self-pay | Admitting: Thoracic Surgery (Cardiothoracic Vascular Surgery)

## 2024-06-26 DIAGNOSIS — Z951 Presence of aortocoronary bypass graft: Secondary | ICD-10-CM

## 2024-06-26 NOTE — Progress Notes (Addendum)
 [address] [place] [address]             [phone]       HPI:  Edgar Garcia is a 73 year old man with medical history of hypertension, NSTEMI, CAD, prediabetes and hyperlipidemia who returns for routine postoperative follow-up having undergone Off pump CABG X 2, LIMA LAD, RSVG Diagonal and Endoscopic greater saphenous vein harvest on the right on 05/08/2024 with Dr. Shyrl.  The patient's early postoperative recovery while in the hospital was notable for having orthostatic hypotension and was started on oral midodrine , this was eventually discontinued.  He developed atrial fibrillation with RVR and was started on IV amiodarone .  He converted to normal sinus rhythm and was transition to oral amiodarone .  He developed cellulitis on both forearms and was started on Keflex .  He was stable for discharge on 05/19/2024.   Since hospital discharge the patient reports that he has been doing good.  He has been walking and doing light gardening for activity.  He reports that his pain is well controlled.  His blood pressure is elevated at today's visit but he reports having a panic attack/anxiety before this visit.  He has brought his home readings and they are 120s-130s/60s-80s.  He checks his blood pressure everyday and has not had elevated readings until today.  He denies chest pain, headache and shortness of breath.   Allergies as of 06/27/2024       Reactions   Brilinta  [ticagrelor ] Other (See Comments)   Headaches   Isordil  [isosorbide  Nitrate] Other (See Comments)   Headaches        Medication List        Accurate as of June 27, 2024 12:59 PM. If you have any questions, ask your nurse or doctor.          STOP taking these medications    cephALEXin  500 MG capsule Commonly known as: KEFLEX  Stopped by: Manuelita CHRISTELLA Rough   oxyCODONE  5 MG immediate release tablet Commonly known as: Oxy IR/ROXICODONE  Stopped by: Manuelita CHRISTELLA Rough       TAKE  these medications    acetaminophen  325 MG tablet Commonly known as: Tylenol  Take 2 tablets (650 mg total) by mouth every 6 (six) hours as needed for mild pain (pain score 1-3).   amiodarone  400 MG tablet Commonly known as: PACERONE  Take 400 mg bid for 5 days;then take 400 mg daily for 7 days;then take 200 mg daily thereafter   apixaban  5 MG Tabs tablet Commonly known as: ELIQUIS  Take 1 tablet (5 mg total) by mouth 2 (two) times daily.   aspirin  EC 81 MG tablet Take 1 tablet (81 mg total) by mouth daily. Swallow whole.   atorvastatin  80 MG tablet Commonly known as: LIPITOR  Take 1 tablet (80 mg total) by mouth daily.   cyanocobalamin 1000 MCG tablet Commonly known as: VITAMIN B12 Take 1,000 mcg by mouth in the morning.   metoprolol  succinate 25 MG 24 hr tablet Commonly known as: TOPROL -XL Take 0.5 tablets (12.5 mg total) by mouth daily.   pantoprazole  40 MG tablet Commonly known as: PROTONIX  Take 1 tablet (40 mg total) by mouth 2 (two) times daily before a meal. NEEDS APPT FOR FURTHER REFILLS         ROS Review of Systems  Constitutional:  Negative for weight loss.  Respiratory: Negative.  Negative for cough, shortness of breath and wheezing.   Cardiovascular:  Negative  for chest pain and leg swelling.  Neurological:  Negative for headaches.      BP (!) 168/88   Pulse 75   Resp 20   Ht 5' 8.5 (1.74 m)   Wt 170 lb 1.6 oz (77.2 kg)   SpO2 99% Comment: RA  BMI 25.49 kg/m   Physical Exam Constitutional:      Appearance: Normal appearance.  HENT:     Head: Normocephalic and atraumatic.  Cardiovascular:     Rate and Rhythm: Normal rate and regular rhythm.     Heart sounds: Normal heart sounds, S1 normal and S2 normal.  Pulmonary:     Effort: Pulmonary effort is normal.     Breath sounds: Normal breath sounds.  Skin:    General: Skin is warm and dry.      Neurological:     General: No focal deficit present.     Mental Status: He is alert and oriented  to person, place, and time.       Imaging: EXAM: 2 VIEW(S) XRAY OF THE CHEST 06/27/2024 10:37:37 AM   COMPARISON: 05/13/2024   CLINICAL HISTORY: cabg   FINDINGS:   LUNGS AND PLEURA: Right mid lung nodular opacity measuring 1.3 cm, indeterminate. No pulmonary edema. No pleural effusion. No pneumothorax.   HEART AND MEDIASTINUM: Calcified aorta. CABG changes noted. No acute abnormality of the cardiac and mediastinal silhouettes.   BONES AND SOFT TISSUES: Intact sternotomy wires. No acute osseous abnormality.   IMPRESSION: 1. Right mid lung 1.3 cm nodular opacity, indeterminate. Recommend dedicated chest CT for further characterization. 2. No acute findings.   Electronically signed by: Waddell Calk MD 06/27/2024 11:07 AM EDT RP Workstation: [id number]   Assessment/Plan:  S/P CABG x 2 -We reviewed today's chest x ray. -We discussed driving and he is able to since he is not taking narcotic for pain.  First time driving should be a short distance in the day time. -He is cleared to participate in cardiac rehab at this time.   -Dicussed that he should continue to follow lift restriction until a full 3 months from surgery.  His sternal precautions are lifted since it has been 6 weeks from surgery.  -Continue to monitor blood pressure at home.  If readings are elevated then he should alert his cardiologist/pcp -He has been seen by cardiology and he is to continue amiodarone  for 6-8 weeks postsurgery.  He is to continue Eliquis , atorvastatin  and metoprolol .   Manuelita HERO [place], PA-C 12:59 PM 06/27/24

## 2024-06-27 ENCOUNTER — Ambulatory Visit
Admission: RE | Admit: 2024-06-27 | Discharge: 2024-06-27 | Disposition: A | Discharge status: Home / Self Care | Payer: Self-pay | Source: Ambulatory Visit | Attending: Cardiology | Admitting: Cardiology

## 2024-06-27 ENCOUNTER — Ambulatory Visit (INDEPENDENT_AMBULATORY_CARE_PROVIDER_SITE_OTHER): Payer: Self-pay

## 2024-06-27 VITALS — BP 168/88 | HR 75 | Resp 20 | Ht 68.5 in | Wt 170.1 lb

## 2024-06-27 DIAGNOSIS — Z951 Presence of aortocoronary bypass graft: Secondary | ICD-10-CM | POA: Insufficient documentation

## 2024-06-27 NOTE — Patient Instructions (Signed)

## 2024-07-01 ENCOUNTER — Ambulatory Visit: Admitting: Cardiology

## 2024-07-02 ENCOUNTER — Telehealth (HOSPITAL_COMMUNITY): Payer: Self-pay

## 2024-07-02 NOTE — Telephone Encounter (Signed)
 Attempted to call patient regarding cardiac rehab- no answer, unable to leave message as voicemail is full. Sent MyChart message.

## 2024-07-02 NOTE — Progress Notes (Unsigned)
 Cardiology Office Note   Date:  07/09/2024  ID:  Edgar Garcia, DOB [birth year], MRN [id number] PCP: Pcp, No  [facility] Providers Cardiologist:  [name], MD     History of Present Illness Edgar Garcia is a 73 y.o. male with a past medical history of CAD s/p CABG x 2, hypertension, PVCs, alcohol abuse, dyslipidemia, carotid artery stenosis.  05/08/2024 CABG x 2 LIMA to LAD, RSVG to diagonal 05/06/2024 carotid duplex right ICA 60 to 79% stenosis, left ICA 1 to 39% stenosis 04/15/2024 echo EF 55 to 60%, mildly elevated PASP, mild MR, aortic valve sclerosis present without stenosis 02/28/2024 cardiac cath multivessel CAD >> referred to [facility] 11/06/2023 cardiac PET high risk, findings consistent with infarction, peri-infarction moderate to severe LAD ischemia 08/07/2021 cardiac cath unsuccessful percutaneous revascularization complicated by coronary dissection and periprocedural MI  He initially established care with Dr. Lawrence in 2022 for episodes of chest pain with exertion >> stress evaluation was arranged and considered to be a high risk study, cardiac cath was recommended and completed in November 2022 however he had dissection of his mid to distal LAD.  There had been some discussion around surgical intervention at this time however he remained relatively asymptomatic with medical management.    In 2025 he underwent a cardiac PET which was high risk consistent with peri-infarct ischemia so he underwent a left heart cath on 02/28/2024 revealing multivessel CAD was referred to [facility].  He was admitted to [facility] 05/08/2024 to 05/19/2024 and underwent CABG x 2 LIMA to LAD, RSVG to diagonal >> required midodrine  for hypotension and dizziness >> developed postop atrial fibrillation with RVR requiring amiodarone  >> also developed cellulitis and required Keflex , ultimately discharged home with PT and OT services.  Most recently evaluated by Orren Fabry, PA on 06/02/2024, he was  stable advised to monitor his blood pressure and his Lasix  was stopped and advised to follow-up in 3 months.  He presents today for follow-up of his coronary artery disease, has been doing relatively well since last evaluated in our office, was evaluated by [facility] and cleared to participate in cardiac rehab and is waiting to hear from them.  He continues to be active around his home without any anginal complaints.  He has whitecoat hypertension and his blood pressure is elevated in the office today.  He brings his blood pressure log at home and revealed that they appear to be largely controlled but over the last week or so the systolic has been creeping up around 130.  He is eager to get off some of his medications.  Regarding his atrial fibrillation, he was relatively unaware, he is maintaining sinus rhythm today, he does check his blood pressure which will also check his heart rate but he does not have any other way of checking his rhythm at home.  He denies chest pain, palpitations, dyspnea, pnd, orthopnea, n, v, dizziness, syncope, edema, weight gain, or early satiety.   ROS: Review of Systems  Constitutional:  Positive for malaise/fatigue.  Psychiatric/Behavioral:         Endorses some anxiety   All other systems reviewed and are negative.    Studies Reviewed EKG Interpretation Date/Time:  Wednesday July 09 2024 10:24:09 EDT Ventricular Rate:  68 PR Interval:  196 QRS Duration:  96 QT Interval:  404 QTC Calculation: 429 R Axis:   40  Text Interpretation: Normal sinus rhythm Normal ECG When compared with ECG of 17-May-2024 19:00, Sinus rhythm has replaced  Atrial fibrillation Nonspecific T wave abnormality, improved in Inferior leads T wave inversion no longer evident in Anterior leads Confirmed by Carlin Nest [phone]) on 07/09/2024 10:27:41 AM     Risk Assessment/Calculations  CHA2DS2-VASc Score = 5   This indicates a 7.2% annual risk of stroke. The patient's score is based  upon: CHF History: 0 HTN History: 1 Diabetes History: 1 Stroke History: 0 Vascular Disease History: 1 Age Score: 2 Gender Score: 0    HYPERTENSION CONTROL Vitals:   07/09/24 1015 07/09/24 1434  BP: (!) 180/88 (!) 170/80    The patient's blood pressure is elevated above target today.  In order to address the patient's elevated BP: The blood pressure is usually elevated in clinic.  Blood pressures monitored at home have been optimal.          Physical Exam VS:  BP (!) 170/80   Pulse 68   Ht 5' 8 (1.727 m)   Wt 169 lb (76.7 kg)   SpO2 100%   BMI 25.70 kg/m        Wt Readings from Last 3 Encounters:  07/09/24 169 lb (76.7 kg)  06/27/24 170 lb 1.6 oz (77.2 kg)  06/02/24 168 lb (76.2 kg)    GEN: Well nourished, well developed in no acute distress NECK: No JVD; No carotid bruits CARDIAC: RRR, no murmurs, rubs, gallops RESPIRATORY:  Clear to auscultation without rales, wheezing or rhonchi  ABDOMEN: Soft, non-tender, non-distended EXTREMITIES:  No edema; No deformity   ASSESSMENT AND PLAN CAD-s/p CABG x 2, evaluated by [facility] a few weeks ago and was cleared to participate in cardiac rehab and he is waiting to hear from them.  He is currently on aspirin  81 mg daily as well as Eliquis  5 mg twice daily, Lipitor  80 mg daily, metoprolol  12.5 mg  daily. Stable with no anginal symptoms. No indication for ischemic evaluation.    Paroxysmal atrial fibrillation/hypercoagulable state-he is maintaining sinus rhythm, will stop his amiodarone  today  >> in 1 month he will begin to wear a 2-week monitor to reassess for any episodes of atrial fibrillation and if he has not had any recurrent episodes we will plan on discontinuing his Eliquis .  Continue Eliquis  5 mg twice daily--no indication for dose reduction in denies hematochezia, hematuria, hemoptysis. Continue metoprolol  12.5 mg daily.  Hypertension-he has whitecoat hypertension, he has good healthcare knowledge and has a validated blood  pressure cuff at home, and his blood pressure is persistently elevated in office setting.  His antihypertensives were discontinued during his hospitalization secondary to hypotension.  Blood pressure readings he presents today are borderline elevated so we will restart him on a low-dose of amlodipine  2.5 mg in the evening.  Continue metoprolol  12.5 mg daily.  Dyslipidemia-his LDL is excellently controlled at 23, continue Lipitor  80 mg daily--this was increased at hospital discharge so we will repeat c-Met, direct LDL, LP(a), ApoB.  Carotid artery stenosis-noted on workup prior to his CABG, right ICA 60 to 79% stenosed, left ICA 1 to 39% stenosed--no bruit appreciated.  Will arrange for repeat carotid artery duplex around February 2026 which will be the 93-month mark.  He is on Eliquis , aspirin , Lipitor .     Cardiac Rehabilitation Eligibility Assessment  The patient is ready to start cardiac rehabilitation from a cardiac standpoint.       Dispo: Discontinue amiodarone  >> a month from discontinuation he will wear a ZIO monitor for 2 weeks, start amlodipine  2.5 mg in the evening,  CMET, direct LDL, APO  B, lipoprotein a today.  Carotid duplex in February 2026.  follow-up in 3 months with Dr. Elmira.  Signed, Delon JAYSON Hoover, NP

## 2024-07-02 NOTE — Telephone Encounter (Signed)
 Pt insurance is active and benefits verified through [facility]. Co-pay $25, DED $350/$350 met, out of pocket $9,350/$4,060 met, co-insurance 0%. No pre-authorization required. Passport, 07/02/2024 @ 2:42pm, REF# [id number].  TCR/ICR? ICR Visit(date of service)limitation? No Can multiple codes be used on the same date of service/visit?(IF ITS A LIMIT) N/A  Is this a lifetime maximum or an annual maximum? Annual Has the member used any of these services to date? No Is there a time limit (weeks/months) on start of program and/or program completion? No

## 2024-07-08 ENCOUNTER — Telehealth: Payer: Self-pay | Admitting: Gastroenterology

## 2024-07-08 DIAGNOSIS — K259 Gastric ulcer, unspecified as acute or chronic, without hemorrhage or perforation: Secondary | ICD-10-CM

## 2024-07-08 DIAGNOSIS — D500 Iron deficiency anemia secondary to blood loss (chronic): Secondary | ICD-10-CM

## 2024-07-08 MED ORDER — PANTOPRAZOLE SODIUM 40 MG PO TBEC: 40.0000 mg | DELAYED_RELEASE_TABLET | Freq: Two times a day (BID) | ORAL | 1 refills | Status: AC

## 2024-07-08 NOTE — Telephone Encounter (Signed)
 Inbound call from patient requesting refill for Protonix . Patient has been scheduled for appointment with Dr. Stacia for 12/9 at 9:50. Please advise.

## 2024-07-08 NOTE — Telephone Encounter (Signed)
 Prescription sent to patient's pharmacy until scheduled appt.

## 2024-07-09 ENCOUNTER — Other Ambulatory Visit: Payer: Self-pay | Admitting: Physician Assistant

## 2024-07-09 ENCOUNTER — Encounter: Payer: Self-pay | Admitting: Cardiology

## 2024-07-09 ENCOUNTER — Ambulatory Visit: Attending: Cardiovascular Disease | Admitting: Cardiology

## 2024-07-09 ENCOUNTER — Ambulatory Visit

## 2024-07-09 VITALS — BP 170/80 | HR 68 | Ht 68.0 in | Wt 169.0 lb

## 2024-07-09 DIAGNOSIS — I4891 Unspecified atrial fibrillation: Secondary | ICD-10-CM | POA: Diagnosis not present

## 2024-07-09 DIAGNOSIS — I10 Essential (primary) hypertension: Secondary | ICD-10-CM | POA: Diagnosis not present

## 2024-07-09 DIAGNOSIS — I25118 Atherosclerotic heart disease of native coronary artery with other forms of angina pectoris: Secondary | ICD-10-CM | POA: Diagnosis not present

## 2024-07-09 DIAGNOSIS — E782 Mixed hyperlipidemia: Secondary | ICD-10-CM | POA: Diagnosis not present

## 2024-07-09 DIAGNOSIS — I6523 Occlusion and stenosis of bilateral carotid arteries: Secondary | ICD-10-CM

## 2024-07-09 MED ORDER — AMLODIPINE BESYLATE 2.5 MG PO TABS: ORAL_TABLET | ORAL | 3 refills | Status: DC

## 2024-07-09 NOTE — Progress Notes (Unsigned)
 Enrolled for [employer] to mail a ZIO XT long term holter monitor to the patients address on file.   Requested monitor to be mailed 08/05/24 for patient to apply 08/08/24. (30 days after stopping Amiodarone ).  Dr. Newman Lawrence to read.

## 2024-07-09 NOTE — Patient Instructions (Signed)
 Medication Instructions:  Stop Amiodarone  as directed Start Amlodipine  2.5 mg at night  *If you need a refill on your cardiac medications before your next appointment, please call your pharmacy*  Lab Work: CMET, Lpa, Apob, LDL today  Testing/Procedures: Your physician has requested that you have a carotid duplex February 2026. This test is an ultrasound of the carotid arteries in your neck. It looks at blood flow through these arteries that supply the brain with blood. Allow one hour for this exam. There are no restrictions or special instructions.   ZIO XT- Long Term Monitor Instructions  Your physician has requested you wear a ZIO patch monitor for 14 days (in 1 month).  This is a single patch monitor. [employer] supplies one patch monitor per enrollment. Additional stickers are not available. Please do not apply patch if you will be having a Nuclear Stress Test,  Echocardiogram, Cardiac CT, MRI, or Chest Xray during the period you would be wearing the  monitor. The patch cannot be worn during these tests. You cannot remove and re-apply the  ZIO XT patch monitor.  Your ZIO patch monitor will be mailed 3 day USPS to your address on file. It may take 3-5 days  to receive your monitor after you have been enrolled.  Once you have received your monitor, please review the enclosed instructions. Your monitor  has already been registered assigning a specific monitor serial # to you.  Billing and Patient Assistance Program Information  We have supplied [employer] with any of your insurance information on file for billing purposes. [employer] offers a sliding scale Patient Assistance Program for patients that do not have  insurance, or whose insurance does not completely cover the cost of the ZIO monitor.  You must apply for the Patient Assistance Program to qualify for this discounted rate.  To apply, please call [employer] at [phone], select option 4, select option 2, ask to apply for  Patient  Assistance Program. Meredeth will ask your household income, and how many people  are in your household. They will quote your out-of-pocket cost based on that information.  [employer] will also be able to set up a 26-month, interest-free payment plan if needed.  Applying the monitor   Shave hair from upper left chest.  Hold abrader disc by orange tab. Rub abrader in 40 strokes over the upper left chest as  indicated in your monitor instructions.  Clean area with 4 enclosed alcohol pads. Let dry.  Apply patch as indicated in monitor instructions. Patch will be placed under collarbone on left  side of chest with arrow pointing upward.  Rub patch adhesive wings for 2 minutes. Remove white label marked 1. Remove the white  label marked 2. Rub patch adhesive wings for 2 additional minutes.  While looking in a mirror, press and release button in center of patch. A small green light will  flash 3-4 times. This will be your only indicator that the monitor has been turned on.  Do not shower for the first 24 hours. You may shower after the first 24 hours.  Press the button if you feel a symptom. You will hear a small click. Record Date, Time and  Symptom in the Patient Logbook.  When you are ready to remove the patch, follow instructions on the last 2 pages of Patient  Logbook. Stick patch monitor onto the last page of Patient Logbook.  Place Patient Logbook in the blue and white box. Use locking tab on box and tape box  closed  securely. The blue and white box has prepaid postage on it. Please place it in the mailbox as  soon as possible. Your physician should have your test results approximately 7 days after the  monitor has been mailed back to [facility].  Call [facility] Customer Care at [phone] if you have questions regarding  your ZIO XT patch monitor. Call them immediately if you see an orange light blinking on your  monitor.  If your monitor falls off in less than 4 days,  contact our Monitor department at [phone].  If your monitor becomes loose or falls off after 4 days call [employer] at [phone] for  suggestions on securing your monitor   Follow-Up: At [facility], you and your health needs are our priority.  As part of our continuing mission to provide you with exceptional heart care, our providers are all part of one team.  This team includes your primary Cardiologist (physician) and Advanced Practice Providers or APPs (Physician Assistants and Nurse Practitioners) who all work together to provide you with the care you need, when you need it.  Your next appointment:   3 month(s)  Provider:   [name], MD    We recommend signing up for the patient portal called MyChart.  Sign up information is provided on this After Visit Summary.  MyChart is used to connect with patients for Virtual Visits (Telemedicine).  Patients are able to view lab/test results, encounter notes, upcoming appointments, etc.  Non-urgent messages can be sent to your provider as well.   To learn more about what you can do with MyChart, go to [email or web address].

## 2024-07-11 ENCOUNTER — Ambulatory Visit: Payer: Self-pay | Admitting: Cardiology

## 2024-07-11 LAB — COMPREHENSIVE METABOLIC PANEL WITH GFR
ALT: 24 IU/L (ref 0–44)
AST: 22 IU/L (ref 0–40)
Albumin: 4.5 g/dL (ref 3.8–4.8)
Alkaline Phosphatase: 109 IU/L (ref 47–123)
BUN/Creatinine Ratio: 13 (ref 10–24)
BUN: 9 mg/dL (ref 8–27)
Bilirubin Total: 0.6 mg/dL (ref 0.0–1.2)
CO2: 19 mmol/L — ABNORMAL LOW (ref 20–29)
Calcium: 8.9 mg/dL (ref 8.6–10.2)
Chloride: 95 mmol/L — ABNORMAL LOW (ref 96–106)
Creatinine, Ser: 0.72 mg/dL — ABNORMAL LOW (ref 0.76–1.27)
Globulin, Total: 2.7 g/dL (ref 1.5–4.5)
Glucose: 88 mg/dL (ref 70–99)
Potassium: 4.3 mmol/L (ref 3.5–5.2)
Sodium: 134 mmol/L (ref 134–144)
Total Protein: 7.2 g/dL (ref 6.0–8.5)
eGFR: 95 mL/min/1.73

## 2024-07-11 LAB — LDL CHOLESTEROL, DIRECT: LDL Direct: 55 mg/dL (ref 0–99)

## 2024-07-11 LAB — LIPOPROTEIN A (LPA): Lipoprotein (a): 12.9 nmol/L

## 2024-07-11 LAB — APOLIPOPROTEIN B: Apolipoprotein B: 55 mg/dL

## 2024-07-15 ENCOUNTER — Telehealth (HOSPITAL_COMMUNITY): Payer: Self-pay

## 2024-07-15 ENCOUNTER — Telehealth: Payer: Self-pay | Admitting: Cardiology

## 2024-07-15 MED ORDER — APIXABAN 5 MG PO TABS: 5.0000 mg | ORAL_TABLET | Freq: Two times a day (BID) | ORAL | 5 refills | Status: DC

## 2024-07-15 NOTE — Telephone Encounter (Signed)
*  STAT* If patient is at the pharmacy, call can be transferred to refill team.   1. Which medications need to be refilled? (please list name of each medication and dose if known)   apixaban  (ELIQUIS ) 5 MG TABS tablet    2. Which pharmacy/location (including street and city if local pharmacy) is medication to be sent to? [id number] - [place],  - [address] AT [facility] OF [address]   3. Do they need a 30 day or 90 day supply?  30 day supply

## 2024-07-15 NOTE — Telephone Encounter (Signed)
 Prescription refill request for Eliquis  received. Indication:  A-Fib Last office visit:07/09/2024 Scr:0.72  last lab on 07/10/2024 Age:  73 yrs. Weight: 76.7 kg  Pt meet protocol and pt's medication was sent to pt's pharmacy as requested.

## 2024-07-15 NOTE — Telephone Encounter (Signed)
 Attempted f/u call regarding cardiac rehab- no answer, unable to leave message. Confirmed patient read but did not respond to MyChart message.  Closing referral.

## 2024-07-17 NOTE — Telephone Encounter (Signed)
 Called pt to discuss lab results. Unable to contact pt. 3rd attempt

## 2024-07-22 ENCOUNTER — Encounter (HOSPITAL_COMMUNITY): Payer: Self-pay

## 2024-07-30 ENCOUNTER — Encounter (HOSPITAL_COMMUNITY)
Admission: RE | Admit: 2024-07-30 | Discharge: 2024-07-30 | Disposition: A | Discharge status: Home / Self Care | Source: Ambulatory Visit | Attending: Cardiology | Admitting: Cardiology

## 2024-07-30 VITALS — BP 170/80 | HR 64 | Ht 68.5 in | Wt 172.8 lb

## 2024-07-30 DIAGNOSIS — Z951 Presence of aortocoronary bypass graft: Secondary | ICD-10-CM | POA: Diagnosis present

## 2024-07-30 NOTE — Progress Notes (Signed)
 Sent this message to supervising provider - Barnie Hila NP via chat:  Pt of Edgar Garcia who is in today for orientation s/p CABG x 2 in August. Documented elevation in BP referenced white coat syndrome. On Metoprolol  12.5 mg daily am and Amlodipine  2.5 mg at night ( recently added on 10/22) BP elevated this morning 170/80 Took meds admits to feeling nervous about the appt, 2 cups of coffee 1/2 caf brand. Reports much lower bp at home 120-140s ok to proceed with walk test? I can reach out to the provider for acceptable bp specific for this pt? Your thoughts??  11:21 AM Barnie Hila, NP I think he is safe to proceed with walk test. It does appear he is consistently high for appointments.   Walk test completed: Post walk test 198/88 post 186/84. Okay to let him go home? 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  Will also check to see ok to take the Amlodipine  dose in the morning along with his metoprolol .in the morning prior to exercise later in the day.  I will convey any messages from the provider to the pt.  Admits he does not pick up the phone generally and ok to leave detailed message.  Saturnino Bernett PEAK, BSN Cardiac and Emergency Planning/management Officer

## 2024-07-30 NOTE — Progress Notes (Signed)
 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 Swallow whole., Disp: , Rfl:    atorvastatin  (LIPITOR ) 80 MG tablet, Take 1 tablet (80 mg total) by mouth daily., Disp: 60 tablet, Rfl: 1   metoprolol  succinate (TOPROL -XL) 25 MG 24 hr tablet, Take 0.5 tablets (12.5 mg total) by mouth daily., Disp: 30 tablet, Rfl: 1   pantoprazole  (PROTONIX ) 40 MG tablet, Take 1 tablet (40 mg total) by mouth 2 (two) times daily before a meal., Disp: 60 tablet, Rfl: 1   vitamin B-12 (CYANOCOBALAMIN) 1000 MCG tablet, Take 1,000 mcg by mouth in the morning., Disp: , Rfl:   Past Medical History: Past Medical History:  Diagnosis Date   Anginal pain    Anxiety    Arthritis    CAD (coronary artery disease)    07/26/2021 unsuccessful PCI mLAD - went into Vfib on the table   GERD (gastroesophageal reflux disease)    Hyperlipidemia    Hypertension    Myocardial infarction (HCC)    Stroke (HCC)    TIA 2010 - No deficits   Substance  abuse (HCC)    Hx of ETOH abuse. No alcohol in a few months as of 2025   TIA (transient ischemic attack)     Tobacco Use: Social History   Tobacco Use  Smoking Status Former   Current packs/day: 0.00   Average packs/day: 0.5 packs/day for 25.0 years (12.5 ttl pk-yrs)   Types: Cigarettes   Start date: 81   Quit date: 2013   Years since quitting: 12.8  Smokeless Tobacco Never    Labs: Review Flowsheet  More data exists      Latest Ref Rng & Units 10/12/2023 05/06/2024 05/08/2024 05/10/2024 07/10/2024  Labs for ITP Cardiac and Pulmonary Rehab  Cholestrol 0 - 200 mg/dL 855  - - 66  -  LDL (calc) 0 - 99 mg/dL 57  - - 23  -  Direct LDL 0 - 99 mg/dL - - - - 55   HDL-C >59 mg/dL 76  - - 35  -  Trlycerides <150 mg/dL 52  - - 42  -  Hemoglobin A1c 4.8 - 5.6 % - 5.3  - - -  PH, Arterial 7.35 - 7.45 - - 7.356  7.340  7.450  7.360  - -  PCO2 arterial 32 - 48 mmHg - - 39.4  37.6  32.3  36.5  - -  Bicarbonate 20.0 - 28.0 mmol/L - -  22.1  20.7  23.3  20.6  - -  TCO2 22 - 32 mmol/L - - 23  22  24  22  24  22  25   - -  Acid-base deficit 0.0 - 2.0 mmol/L - - 3.0  5.0  1.0  4.0  - -  O2 Saturation % - - 97  99  97  100  - -    Details       Multiple values from one day are sorted in reverse-chronological order         Capillary Blood Glucose: Lab Results  Component Value Date   GLUCAP 96 05/17/2024   GLUCAP 91 05/12/2024   GLUCAP 90 05/12/2024   GLUCAP 106 (H) 05/12/2024   GLUCAP 94 05/12/2024     Exercise Target Goals: Exercise Program Goal: Individual exercise prescription set using results from initial 6 min walk test and THRR while considering  patient's activity barriers and safety.   Exercise Prescription Goal: Initial exercise prescription builds to 30-45 minutes a day of aerobic activity, 2-3 days per week.  Home exercise guidelines will be given to patient during program as part of exercise prescription that the participant will acknowledge.  Activity Barriers &  Risk Stratification:  Activity Barriers & Cardiac Risk Stratification - 07/30/24 1121       Activity Barriers & Cardiac Risk Stratification   Activity Barriers Arthritis;Neck/Spine Problems;Balance Concerns    Cardiac Risk Stratification High   <5 METs on         6 Minute Walk:  6 Minute Walk     Row Name 07/30/24 1635         6 Minute Walk   Phase Initial     Distance 1255 feet     Walk Time 6 minutes     # of Rest Breaks 0     MPH 2.38     METS 2.96     RPE 13     Perceived Dyspnea  0     VO2 Peak 10.37     Symptoms No     Resting HR 64 bpm     Resting BP 170/80     Resting Oxygen Saturation  99 %     Exercise Oxygen Saturation  during 6 min walk 99 %     Max Ex. HR 86 bpm     Max Ex. BP 198/88     2 Minute Post BP 188/80        Oxygen Initial Assessment:   Oxygen Re-Evaluation:   Oxygen Discharge (Final Oxygen Re-Evaluation):   Initial Exercise Prescription:  Initial Exercise Prescription - 07/30/24 1100       Date of Initial Exercise RX and Referring Provider   Date 07/30/24    Referring Provider Newman Lawrence, MD    Expected Discharge Date 10/22/24      Bike   Level 1    Watts 60    Minutes 15    METs 2.5      Track   Laps 14    Minutes 15    METs 2.2      Prescription Details   Frequency (times per week) 3    Duration Progress to 30 minutes of continuous aerobic without signs/symptoms of physical distress      Intensity   THRR 40-80% of Max Heartrate 58-116    Ratings of Perceived Exertion 11-13    Perceived Dyspnea 0-4      Progression   Progression Continue  progressive overload as per policy without signs/symptoms or physical distress.      Resistance Training   Training Prescription Yes    Weight 3    Reps 10-15          Perform Capillary Blood Glucose checks as needed.  Exercise Prescription Changes:   Exercise Comments:   Exercise Goals and Review:   Exercise Goals     Row Name 07/30/24 1123              Exercise Goals   Increase Physical Activity Yes       Intervention Provide advice, education, support and counseling about physical activity/exercise needs.;Develop an individualized exercise prescription for aerobic and resistive training based on initial evaluation findings, risk stratification, comorbidities and participant's personal goals.       Expected Outcomes Short Term: Attend rehab on a regular basis to increase amount of physical activity.;Long Term: Exercising regularly at least 3-5 days a week.;Long Term: Add in home exercise to make exercise part of routine and to increase amount of physical activity.       Increase Strength and Stamina Yes       Intervention Develop an individualized exercise prescription for aerobic and resistive training based on initial evaluation findings, risk stratification, comorbidities and participant's personal goals.;Provide advice, education, support and counseling about physical activity/exercise needs.       Expected Outcomes Long Term: Improve cardiorespiratory fitness, muscular endurance and strength as measured by increased METs and functional capacity ( );Short Term: Perform resistance training exercises routinely during rehab and add in resistance training at home;Short Term: Increase workloads from initial exercise prescription for resistance, speed, and METs.       Able to understand and use rate of perceived exertion (RPE) scale Yes       Intervention Provide education and explanation on how to use RPE scale       Expected Outcomes Short Term: Able to use RPE daily in rehab to express subjective intensity level;Long Term:  Able to use RPE to guide intensity level when exercising independently       Knowledge and understanding of Target Heart Rate Range (THRR) Yes       Intervention Provide education and explanation of THRR including how the numbers were predicted and where they are located for reference       Expected Outcomes Long  Term: Able to use THRR to govern intensity when exercising independently;Short Term: Able to state/look up THRR;Short Term: Able to use daily as guideline for intensity in rehab       Understanding of Exercise Prescription Yes       Intervention Provide education, explanation, and written materials on patient's individual exercise prescription       Expected Outcomes Short Term: Able to explain program exercise prescription;Long Term: Able to explain home exercise prescription to exercise independently          Exercise Goals Re-Evaluation :   Discharge Exercise Prescription (Final Exercise Prescription Changes):   Nutrition:  Target Goals: Understanding of nutrition guidelines, daily intake of sodium 1500mg , cholesterol 200mg , calories 30% from fat and 7% or less from saturated fats, daily to have 5 or more servings of fruits and vegetables.  Biometrics:  Pre Biometrics - 07/30/24 1131       Pre Biometrics   Waist Circumference 38 inches    Hip Circumference 40 inches    Waist to Hip Ratio 0.95 %    Triceps Skinfold 18 mm    % Body  Fat 26.9 %    Grip Strength 30 kg    Flexibility 10.62 in    Single Leg Stand 10.62 seconds           Nutrition Therapy Plan and Nutrition Goals:   Nutrition Assessments:  MEDIFICTS Score Key: >=70 Need to make dietary changes  40-70 Heart Healthy Diet <= 40 Therapeutic Level Cholesterol Diet    Picture Your Plate Scores: <59 Unhealthy dietary pattern with much room for improvement. 41-50 Dietary pattern unlikely to meet recommendations for good health and room for improvement. 51-60 More healthful dietary pattern, with some room for improvement.  >60 Healthy dietary pattern, although there may be some specific behaviors that could be improved.    Nutrition Goals Re-Evaluation:   Nutrition Goals Re-Evaluation:   Nutrition Goals Discharge (Final Nutrition Goals Re-Evaluation):   Psychosocial: Target Goals: Acknowledge  presence or absence of significant depression and/or stress, maximize coping skills, provide positive support system. Participant is able to verbalize types and ability to use techniques and skills needed for reducing stress and depression.  Initial Review & Psychosocial Screening:  Initial Psych Review & Screening - 07/30/24 1127       Initial Review   Current issues with None Identified      Family Dynamics   Good Support System? Yes   wife   Comments Esteven denies any depression/stress/anxiety currently. He did share that he has had depression/anxiety in the past and still will have his moments where they return. However, he denies any current issues or need for additional resources at this time. 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      Barriers   Psychosocial barriers to participate in program The patient should benefit from training in stress management and relaxation.      Screening Interventions   Interventions Encouraged to exercise;Provide feedback about the scores to participant    Expected Outcomes Long Term goal: The participant improves quality of Life and PHQ9 Scores as seen by post scores and/or verbalization of changes;Short Term goal: Identification and review with participant of any Quality of Life or Depression concerns found by scoring the questionnaire.;Long Term Goal: Stressors or current issues are controlled or eliminated.          Quality of Life Scores:  Quality of Life - 07/30/24 1640       Quality of Life   Select Quality of Life      Quality of Life Scores   Health/Function Pre 22.86 %    Socioeconomic Pre 24.43 %    Psych/Spiritual Pre 21.3 %    Family Pre 25 %    GLOBAL Pre 23.13 %         Scores of 19 and below usually indicate a poorer quality of life in these areas.  A difference of  2-3 points is a clinically meaningful difference.  A difference of 2-3 points in the total score of the Quality of Life Index has  been associated with significant improvement in overall quality of life, self-image, physical symptoms, and general health in studies assessing change in quality of life.  PHQ-9: Review Flowsheet       07/30/2024  Depression screen PHQ 2/9  Decreased Interest 0  Down, Depressed, Hopeless 0  PHQ - 2 Score 0  Altered sleeping 3  Tired, decreased energy 1  Change in appetite 0  Feeling bad or failure about yourself  0  Trouble concentrating 0  Moving slowly or fidgety/restless  0  Suicidal thoughts 0  PHQ-9 Score 4   Interpretation of Total Score  Total Score Depression Severity:  1-4 = Minimal depression, 5-9 = Mild depression, 10-14 = Moderate depression, 15-19 = Moderately severe depression, 20-27 = Severe depression   Psychosocial Evaluation and Intervention:   Psychosocial Re-Evaluation:   Psychosocial Discharge (Final Psychosocial Re-Evaluation):   Vocational Rehabilitation: Provide vocational rehab assistance to qualifying candidates.   Vocational Rehab Evaluation & Intervention:  Vocational Rehab - 07/30/24 1124       Initial Vocational Rehab Evaluation & Intervention   Assessment shows need for Vocational Rehabilitation No   retired education administrator         Education: Education Goals: Education classes will be provided on a weekly basis, covering required topics. Participant will state understanding/return demonstration of topics presented.     Core Videos: Exercise    Move It!  Clinical staff conducted group or individual video education with verbal and written material and guidebook.  Patient learns the recommended Pritikin exercise program. Exercise with the goal of living a long, healthy life. Some of the health benefits of exercise include controlled diabetes, healthier blood pressure levels, improved cholesterol levels, improved heart and lung capacity, improved sleep, and better body composition. Everyone should speak with their doctor before starting or  changing an exercise routine.  Biomechanical Limitations Clinical staff conducted group or individual video education with verbal and written material and guidebook.  Patient learns how biomechanical limitations can impact exercise and how we can mitigate and possibly overcome limitations to have an impactful and balanced exercise routine.  Body Composition Clinical staff conducted group or individual video education with verbal and written material and guidebook.  Patient learns that body composition (ratio of muscle mass to fat mass) is a key component to assessing overall fitness, rather than body weight alone. Increased fat mass, especially visceral belly fat, can put us  at increased risk for metabolic syndrome, type 2 diabetes, heart disease, and even death. It is recommended to combine diet and exercise (cardiovascular and resistance training) to improve your body composition. Seek guidance from your physician and exercise physiologist before implementing an exercise routine.  Exercise Action Plan Clinical staff conducted group or individual video education with verbal and written material and guidebook.  Patient learns the recommended strategies to achieve and enjoy long-term exercise adherence, including variety, self-motivation, self-efficacy, and positive decision making. Benefits of exercise include fitness, good health, weight management, more energy, better sleep, less stress, and overall well-being.  Medical   Heart Disease Risk Reduction Clinical staff conducted group or individual video education with verbal and written material and guidebook.  Patient learns our heart is our most vital organ as it circulates oxygen, nutrients, white blood cells, and hormones throughout the entire body, and carries waste away. Data supports a plant-based eating plan like the Pritikin Program for its effectiveness in slowing progression of and reversing heart disease. The video provides a number of  recommendations to address heart disease.   Metabolic Syndrome and Belly Fat  Clinical staff conducted group or individual video education with verbal and written material and guidebook.  Patient learns what metabolic syndrome is, how it leads to heart disease, and how one can reverse it and keep it from coming back. You have metabolic syndrome if you have 3 of the following 5 criteria: abdominal obesity, high blood pressure, high triglycerides, low HDL cholesterol, and high blood sugar.  Hypertension and Heart Disease Clinical staff conducted group or individual video education with  verbal and written material and guidebook.  Patient learns that high blood pressure, or hypertension, is very common in the [place] . Hypertension is largely due to excessive salt intake, but other important risk factors include being overweight, physical inactivity, drinking too much alcohol, smoking, and not eating enough potassium from fruits and vegetables. High blood pressure is a leading risk factor for heart attack, stroke, congestive heart failure, dementia, kidney failure, and premature death. Long-term effects of excessive salt intake include stiffening of the arteries and thickening of heart muscle and organ damage. Recommendations include ways to reduce hypertension and the risk of heart disease.  Diseases of Our Time - Focusing on Diabetes Clinical staff conducted group or individual video education with verbal and written material and guidebook.  Patient learns why the best way to stop diseases of our time is prevention, through food and other lifestyle changes. Medicine (such as prescription pills and surgeries) is often only a Band-Aid on the problem, not a long-term solution. Most common diseases of our time include obesity, type 2 diabetes, hypertension, heart disease, and cancer. The Pritikin Program is recommended and has been proven to help reduce, reverse, and/or prevent the damaging effects of  metabolic syndrome.  Nutrition   Overview of the Pritikin Eating Plan  Clinical staff conducted group or individual video education with verbal and written material and guidebook.  Patient learns about the Pritikin Eating Plan for disease risk reduction. The Pritikin Eating Plan emphasizes a wide variety of unrefined, minimally-processed carbohydrates, like fruits, vegetables, whole grains, and legumes. Go, Caution, and Stop food choices are explained. Plant-based and lean animal proteins are emphasized. Rationale provided for low sodium intake for blood pressure control, low added sugars for blood sugar stabilization, and low added fats and oils for coronary artery disease risk reduction and weight management.  Calorie Density  Clinical staff conducted group or individual video education with verbal and written material and guidebook.  Patient learns about calorie density and how it impacts the Pritikin Eating Plan. Knowing the characteristics of the food you choose will help you decide whether those foods will lead to weight gain or weight loss, and whether you want to consume more or less of them. Weight loss is usually a side effect of the Pritikin Eating Plan because of its focus on low calorie-dense foods.  Label Reading  Clinical staff conducted group or individual video education with verbal and written material and guidebook.  Patient learns about the Pritikin recommended label reading guidelines and corresponding recommendations regarding calorie density, added sugars, sodium content, and whole grains.  Dining Out - Part 1  Clinical staff conducted group or individual video education with verbal and written material and guidebook.  Patient learns that restaurant meals can be sabotaging because they can be so high in calories, fat, sodium, and/or sugar. Patient learns recommended strategies on how to positively address this and avoid unhealthy pitfalls.  Facts on Fats  Clinical staff  conducted group or individual video education with verbal and written material and guidebook.  Patient learns that lifestyle modifications can be just as effective, if not more so, as many medications for lowering your risk of heart disease. A Pritikin lifestyle can help to reduce your risk of inflammation and atherosclerosis (cholesterol build-up, or plaque, in the artery walls). Lifestyle interventions such as dietary choices and physical activity address the cause of atherosclerosis. A review of the types of fats and their impact on blood cholesterol levels, along with dietary recommendations to reduce fat  intake is also included.  Nutrition Action Plan  Clinical staff conducted group or individual video education with verbal and written material and guidebook.  Patient learns how to incorporate Pritikin recommendations into their lifestyle. Recommendations include planning and keeping personal health goals in mind as an important part of their success.  Healthy Mind-Set    Healthy Minds, Bodies, Hearts  Clinical staff conducted group or individual video education with verbal and written material and guidebook.  Patient learns how to identify when they are stressed. Video will discuss the impact of that stress, as well as the many benefits of stress management. Patient will also be introduced to stress management techniques. The way we think, act, and feel has an impact on our hearts.  How Our Thoughts Can Heal Our Hearts  Clinical staff conducted group or individual video education with verbal and written material and guidebook.  Patient learns that negative thoughts can cause depression and anxiety. This can result in negative lifestyle behavior and serious health problems. Cognitive behavioral therapy is an effective method to help control our thoughts in order to change and improve our emotional outlook.  Additional Videos:  Exercise    Improving Performance  Clinical staff conducted group  or individual video education with verbal and written material and guidebook.  Patient learns to use a non-linear approach by alternating intensity levels and lengths of time spent exercising to help burn more calories and lose more body fat. Cardiovascular exercise helps improve heart health, metabolism, hormonal balance, blood sugar control, and recovery from fatigue. Resistance training improves strength, endurance, balance, coordination, reaction time, metabolism, and muscle mass. Flexibility exercise improves circulation, posture, and balance. Seek guidance from your physician and exercise physiologist before implementing an exercise routine and learn your capabilities and proper form for all exercise.  Introduction to Yoga  Clinical staff conducted group or individual video education with verbal and written material and guidebook.  Patient learns about yoga, a discipline of the coming together of mind, breath, and body. The benefits of yoga include improved flexibility, improved range of motion, better posture and core strength, increased lung function, weight loss, and positive self-image. Yoga's heart health benefits include lowered blood pressure, healthier heart rate, decreased cholesterol and triglyceride levels, improved immune function, and reduced stress. Seek guidance from your physician and exercise physiologist before implementing an exercise routine and learn your capabilities and proper form for all exercise.  Medical   Aging: Enhancing Your Quality of Life  Clinical staff conducted group or individual video education with verbal and written material and guidebook.  Patient learns key strategies and recommendations to stay in good physical health and enhance quality of life, such as prevention strategies, having an advocate, securing a Health Care Proxy and Power of Attorney, and keeping a list of medications and system for tracking them. It also discusses how to avoid risk for bone  loss.  Biology of Weight Control  Clinical staff conducted group or individual video education with verbal and written material and guidebook.  Patient learns that weight gain occurs because we consume more calories than we burn (eating more, moving less). Even if your body weight is normal, you may have higher ratios of fat compared to muscle mass. Too much body fat puts you at increased risk for cardiovascular disease, heart attack, stroke, type 2 diabetes, and obesity-related cancers. In addition to exercise, following the Pritikin Eating Plan can help reduce your risk.  Decoding Lab Results  Clinical staff conducted group or individual video  education with verbal and written material and guidebook.  Patient learns that lab test reflects one measurement whose values change over time and are influenced by many factors, including medication, stress, sleep, exercise, food, hydration, pre-existing medical conditions, and more. It is recommended to use the knowledge from this video to become more involved with your lab results and evaluate your numbers to speak with your doctor.   Diseases of Our Time - Overview  Clinical staff conducted group or individual video education with verbal and written material and guidebook.  Patient learns that according to the CDC, 50% to 70% of chronic diseases (such as obesity, type 2 diabetes, elevated lipids, hypertension, and heart disease) are avoidable through lifestyle improvements including healthier food choices, listening to satiety cues, and increased physical activity.  Sleep Disorders Clinical staff conducted group or individual video education with verbal and written material and guidebook.  Patient learns how good quality and duration of sleep are important to overall health and well-being. Patient also learns about sleep disorders and how they impact health along with recommendations to address them, including discussing with a physician.  Nutrition   Dining Out - Part 2 Clinical staff conducted group or individual video education with verbal and written material and guidebook.  Patient learns how to plan ahead and communicate in order to maximize their dining experience in a healthy and nutritious manner. Included are recommended food choices based on the type of restaurant the patient is visiting.   Fueling a Banker conducted group or individual video education with verbal and written material and guidebook.  There is a strong connection between our food choices and our health. Diseases like obesity and type 2 diabetes are very prevalent and are in large-part due to lifestyle choices. The Pritikin Eating Plan provides plenty of food and hunger-curbing satisfaction. It is easy to follow, affordable, and helps reduce health risks.  Menu Workshop  Clinical staff conducted group or individual video education with verbal and written material and guidebook.  Patient learns that restaurant meals can sabotage health goals because they are often packed with calories, fat, sodium, and sugar. Recommendations include strategies to plan ahead and to communicate with the manager, chef, or server to help order a healthier meal.  Planning Your Eating Strategy  Clinical staff conducted group or individual video education with verbal and written material and guidebook.  Patient learns about the Pritikin Eating Plan and its benefit of reducing the risk of disease. The Pritikin Eating Plan does not focus on calories. Instead, it emphasizes high-quality, nutrient-rich foods. By knowing the characteristics of the foods, we choose, we can determine their calorie density and make informed decisions.  Targeting Your Nutrition Priorities  Clinical staff conducted group or individual video education with verbal and written material and guidebook.  Patient learns that lifestyle habits have a tremendous impact on disease risk and progression. This  video provides eating and physical activity recommendations based on your personal health goals, such as reducing LDL cholesterol, losing weight, preventing or controlling type 2 diabetes, and reducing high blood pressure.  Vitamins and Minerals  Clinical staff conducted group or individual video education with verbal and written material and guidebook.  Patient learns different ways to obtain key vitamins and minerals, including through a recommended healthy diet. It is important to discuss all supplements you take with your doctor.   Healthy Mind-Set    Smoking Cessation  Clinical staff conducted group or individual video education with verbal and written  material and guidebook.  Patient learns that cigarette smoking and tobacco addiction pose a serious health risk which affects millions of people. Stopping smoking will significantly reduce the risk of heart disease, lung disease, and many forms of cancer. Recommended strategies for quitting are covered, including working with your doctor to develop a successful plan.  Culinary   Becoming a Set Designer conducted group or individual video education with verbal and written material and guidebook.  Patient learns that cooking at home can be healthy, cost-effective, quick, and puts them in control. Keys to cooking healthy recipes will include looking at your recipe, assessing your equipment needs, planning ahead, making it simple, choosing cost-effective seasonal ingredients, and limiting the use of added fats, salts, and sugars.  Cooking - Breakfast and Snacks  Clinical staff conducted group or individual video education with verbal and written material and guidebook.  Patient learns how important breakfast is to satiety and nutrition through the entire day. Recommendations include key foods to eat during breakfast to help stabilize blood sugar levels and to prevent overeating at meals later in the day. Planning ahead is also a  key component.  Cooking - Educational Psychologist conducted group or individual video education with verbal and written material and guidebook.  Patient learns eating strategies to improve overall health, including an approach to cook more at home. Recommendations include thinking of animal protein as a side on your plate rather than center stage and focusing instead on lower calorie dense options like vegetables, fruits, whole grains, and plant-based proteins, such as beans. Making sauces in large quantities to freeze for later and leaving the skin on your vegetables are also recommended to maximize your experience.  Cooking - Healthy Salads and Dressing Clinical staff conducted group or individual video education with verbal and written material and guidebook.  Patient learns that vegetables, fruits, whole grains, and legumes are the foundations of the Pritikin Eating Plan. Recommendations include how to incorporate each of these in flavorful and healthy salads, and how to create homemade salad dressings. Proper handling of ingredients is also covered. Cooking - Soups and [employer] - Soups and Desserts Clinical staff conducted group or individual video education with verbal and written material and guidebook.  Patient learns that Pritikin soups and desserts make for easy, nutritious, and delicious snacks and meal components that are low in sodium, fat, sugar, and calorie density, while high in vitamins, minerals, and filling fiber. Recommendations include simple and healthy ideas for soups and desserts.   Overview     The Pritikin Solution Program Overview Clinical staff conducted group or individual video education with verbal and written material and guidebook.  Patient learns that the results of the Pritikin Program have been documented in more than 100 articles published in peer-reviewed journals, and the benefits include reducing risk factors for (and, in some cases, even  reversing) high cholesterol, high blood pressure, type 2 diabetes, obesity, and more! An overview of the three key pillars of the Pritikin Program will be covered: eating well, doing regular exercise, and having a healthy mind-set.  WORKSHOPS  Exercise: Exercise Basics: Building Your Action Plan Clinical staff led group instruction and group discussion with PowerPoint presentation and patient guidebook. To enhance the learning environment the use of posters, models and videos may be added. At the conclusion of this workshop, patients will comprehend the difference between physical activity and exercise, as well as the benefits of incorporating both, into their routine.  Patients will understand the FITT (Frequency, Intensity, Time, and Type) principle and how to use it to build an exercise action plan. In addition, safety concerns and other considerations for exercise and cardiac rehab will be addressed by the presenter. The purpose of this lesson is to promote a comprehensive and effective weekly exercise routine in order to improve patients' overall level of fitness.   Managing Heart Disease: Your Path to a Healthier Heart Clinical staff led group instruction and group discussion with PowerPoint presentation and patient guidebook. To enhance the learning environment the use of posters, models and videos may be added.At the conclusion of this workshop, patients will understand the anatomy and physiology of the heart. Additionally, they will understand how Pritikin's three pillars impact the risk factors, the progression, and the management of heart disease.  The purpose of this lesson is to provide a high-level overview of the heart, heart disease, and how the Pritikin lifestyle positively impacts risk factors.  Exercise Biomechanics Clinical staff led group instruction and group discussion with PowerPoint presentation and patient guidebook. To enhance the learning environment the use of  posters, models and videos may be added. Patients will learn how the structural parts of their bodies function and how these functions impact their daily activities, movement, and exercise. Patients will learn how to promote a neutral spine, learn how to manage pain, and identify ways to improve their physical movement in order to promote healthy living. The purpose of this lesson is to expose patients to common physical limitations that impact physical activity. Participants will learn practical ways to adapt and manage aches and pains, and to minimize their effect on regular exercise. Patients will learn how to maintain good posture while sitting, walking, and lifting.  Balance Training and Fall Prevention  Clinical staff led group instruction and group discussion with PowerPoint presentation and patient guidebook. To enhance the learning environment the use of posters, models and videos may be added. At the conclusion of this workshop, patients will understand the importance of their sensorimotor skills (vision, proprioception, and the vestibular system) in maintaining their ability to balance as they age. Patients will apply a variety of balancing exercises that are appropriate for their current level of function. Patients will understand the common causes for poor balance, possible solutions to these problems, and ways to modify their physical environment in order to minimize their fall risk. The purpose of this lesson is to teach patients about the importance of maintaining balance as they age and ways to minimize their risk of falling.  WORKSHOPS   Nutrition:  Fueling a Ship Broker led group instruction and group discussion with PowerPoint presentation and patient guidebook. To enhance the learning environment the use of posters, models and videos may be added. Patients will review the foundational principles of the Pritikin Eating Plan and understand what constitutes a  serving size in each of the food groups. Patients will also learn Pritikin-friendly foods that are better choices when away from home and review make-ahead meal and snack options. Calorie density will be reviewed and applied to three nutrition priorities: weight maintenance, weight loss, and weight gain. The purpose of this lesson is to reinforce (in a group setting) the key concepts around what patients are recommended to eat and how to apply these guidelines when away from home by planning and selecting Pritikin-friendly options. Patients will understand how calorie density may be adjusted for different weight management goals.  Mindful Eating  Clinical staff led group instruction and  group discussion with PowerPoint presentation and patient guidebook. To enhance the learning environment the use of posters, models and videos may be added. Patients will briefly review the concepts of the Pritikin Eating Plan and the importance of low-calorie dense foods. The concept of mindful eating will be introduced as well as the importance of paying attention to internal hunger signals. Triggers for non-hunger eating and techniques for dealing with triggers will be explored. The purpose of this lesson is to provide patients with the opportunity to review the basic principles of the Pritikin Eating Plan, discuss the value of eating mindfully and how to measure internal cues of hunger and fullness using the Hunger Scale. Patients will also discuss reasons for non-hunger eating and learn strategies to use for controlling emotional eating.  Targeting Your Nutrition Priorities Clinical staff led group instruction and group discussion with PowerPoint presentation and patient guidebook. To enhance the learning environment the use of posters, models and videos may be added. Patients will learn how to determine their genetic susceptibility to disease by reviewing their family history. Patients will gain insight into the  importance of diet as part of an overall healthy lifestyle in mitigating the impact of genetics and other environmental insults. The purpose of this lesson is to provide patients with the opportunity to assess their personal nutrition priorities by looking at their family history, their own health history and current risk factors. Patients will also be able to discuss ways of prioritizing and modifying the Pritikin Eating Plan for their highest risk areas  Menu  Clinical staff led group instruction and group discussion with PowerPoint presentation and patient guidebook. To enhance the learning environment the use of posters, models and videos may be added. Using menus brought in from [employer], or printed from [employer], patients will apply the Pritikin dining out guidelines that were presented in the [employer] video. Patients will also be able to practice these guidelines in a variety of provided scenarios. The purpose of this lesson is to provide patients with the opportunity to practice hands-on learning of the Pritikin Dining Out guidelines with actual menus and practice scenarios.  Label Reading Clinical staff led group instruction and group discussion with PowerPoint presentation and patient guidebook. To enhance the learning environment the use of posters, models and videos may be added. Patients will review and discuss the Pritikin label reading guidelines presented in Pritikin's Label Reading Educational series video. Using fool labels brought in from local grocery stores and markets, patients will apply the label reading guidelines and determine if the packaged food meet the Pritikin guidelines. The purpose of this lesson is to provide patients with the opportunity to review, discuss, and practice hands-on learning of the Pritikin Label Reading guidelines with actual packaged food labels. Cooking School  Pritikin's [employer] are designed to teach  patients ways to prepare quick, simple, and affordable recipes at home. The importance of nutrition's role in chronic disease risk reduction is reflected in its emphasis in the overall Pritikin program. By learning how to prepare essential core Pritikin Eating Plan recipes, patients will increase control over what they eat; be able to customize the flavor of foods without the use of added salt, sugar, or fat; and improve the quality of the food they consume. By learning a set of core recipes which are easily assembled, quickly prepared, and affordable, patients are more likely to prepare more healthy foods at home. These workshops focus on convenient breakfasts, simple entres, side dishes,  and desserts which can be prepared with minimal effort and are consistent with nutrition recommendations for cardiovascular risk reduction. Cooking [employer] are taught by a armed forces logistics/support/administrative officer (RD) who has been trained by the [employer]. The chef or RD has a clear understanding of the importance of minimizing - if not completely eliminating - added fat, sugar, and sodium in recipes. Throughout the series of Cooking School Workshop sessions, patients will learn about healthy ingredients and efficient methods of cooking to build confidence in their capability to prepare    Cooking School weekly topics:  Adding Flavor- Sodium-Free  Fast and Healthy Breakfasts  Powerhouse Plant-Based Proteins  Satisfying Salads and Dressings  Simple Sides and Sauces  International Cuisine-Spotlight on the [employer] Zones  Delicious Desserts  Savory Soups  [employer] - Meals in a Astronomer Appetizers and Snacks  Comforting Weekend Breakfasts  One-Pot Wonders   Fast Evening Meals  Landscape Architect Your Pritikin Plate  WORKSHOPS   Healthy Mindset (Psychosocial):  Focused Goals, Sustainable Changes Clinical staff led group instruction and group discussion with PowerPoint  presentation and patient guidebook. To enhance the learning environment the use of posters, models and videos may be added. Patients will be able to apply effective goal setting strategies to establish at least one personal goal, and then take consistent, meaningful action toward that goal. They will learn to identify common barriers to achieving personal goals and develop strategies to overcome them. Patients will also gain an understanding of how our mind-set can impact our ability to achieve goals and the importance of cultivating a positive and growth-oriented mind-set. The purpose of this lesson is to provide patients with a deeper understanding of how to set and achieve personal goals, as well as the tools and strategies needed to overcome common obstacles which may arise along the way.  From Head to Heart: The Power of a Healthy Outlook  Clinical staff led group instruction and group discussion with PowerPoint presentation and patient guidebook. To enhance the learning environment the use of posters, models and videos may be added. Patients will be able to recognize and describe the impact of emotions and mood on physical health. They will discover the importance of self-care and explore self-care practices which may work for them. Patients will also learn how to utilize the 4 C's to cultivate a healthier outlook and better manage stress and challenges. The purpose of this lesson is to demonstrate to patients how a healthy outlook is an essential part of maintaining good health, especially as they continue their cardiac rehab journey.  Healthy Sleep for a Healthy Heart Clinical staff led group instruction and group discussion with PowerPoint presentation and patient guidebook. To enhance the learning environment the use of posters, models and videos may be added. At the conclusion of this workshop, patients will be able to demonstrate knowledge of the importance of sleep to overall health, well-being,  and quality of life. They will understand the symptoms of, and treatments for, common sleep disorders. Patients will also be able to identify daytime and nighttime behaviors which impact sleep, and they will be able to apply these tools to help manage sleep-related challenges. The purpose of this lesson is to provide patients with a general overview of sleep and outline the importance of quality sleep. Patients will learn about a few of the most common sleep disorders. Patients will also be introduced to the concept of "sleep hygiene," and discover ways to self-manage certain  sleeping problems through simple daily behavior changes. Finally, the workshop will motivate patients by clarifying the links between quality sleep and their goals of heart-healthy living.   Recognizing and Reducing Stress Clinical staff led group instruction and group discussion with PowerPoint presentation and patient guidebook. To enhance the learning environment the use of posters, models and videos may be added. At the conclusion of this workshop, patients will be able to understand the types of stress reactions, differentiate between acute and chronic stress, and recognize the impact that chronic stress has on their health. They will also be able to apply different coping mechanisms, such as reframing negative self-talk. Patients will have the opportunity to practice a variety of stress management techniques, such as deep abdominal breathing, progressive muscle relaxation, and/or guided imagery.  The purpose of this lesson is to educate patients on the role of stress in their lives and to provide healthy techniques for coping with it.  Learning Barriers/Preferences:  Learning Barriers/Preferences - 07/30/24 1124       Learning Barriers/Preferences   Learning Barriers None    Learning Preferences Audio;Computer/Internet;Group Instruction;Individual Instruction;Skilled Demonstration;Verbal Instruction;Video;Written  Material;Pictoral          Education Topics:  Knowledge Questionnaire Score:  Knowledge Questionnaire Score - 07/30/24 1136       Knowledge Questionnaire Score   Pre Score 18/24          Core Components/Risk Factors/Patient Goals at Admission:  Personal Goals and Risk Factors at Admission - 07/30/24 1125       Core Components/Risk Factors/Patient Goals on Admission    Weight Management Yes;Weight Maintenance    Intervention Weight Management: Develop a combined nutrition and exercise program designed to reach desired caloric intake, while maintaining appropriate intake of nutrient and fiber, sodium and fats, and appropriate energy expenditure required for the weight goal.;Weight Management: Provide education and appropriate resources to help participant work on and attain dietary goals.    Expected Outcomes Short Term: Continue to assess and modify interventions until short term weight is achieved;Long Term: Adherence to nutrition and physical activity/exercise program aimed toward attainment of established weight goal;Weight Maintenance: Understanding of the daily nutrition guidelines, which includes 25-35% calories from fat, 7% or less cal from saturated fats, less than 200mg  cholesterol, less than 1.5gm of sodium, & 5 or more servings of fruits and vegetables daily;Understanding recommendations for meals to include 15-35% energy as protein, 25-35% energy from fat, 35-60% energy from carbohydrates, less than 200mg  of dietary cholesterol, 20-35 gm of total fiber daily;Understanding of distribution of calorie intake throughout the day with the consumption of 4-5 meals/snacks    Hypertension Yes    Intervention Provide education on lifestyle modifcations including regular physical activity/exercise, weight management, moderate sodium restriction and increased consumption of fresh fruit, vegetables, and low fat dairy, alcohol moderation, and smoking cessation.;Monitor prescription use  compliance.    Expected Outcomes Long Term: Maintenance of blood pressure at goal levels.;Short Term: Continued assessment and intervention until BP is < 140/1mm HG in hypertensive participants. < 130/80mm HG in hypertensive participants with diabetes, heart failure or chronic kidney disease.    Lipids Yes    Intervention Provide education and support for participant on nutrition & aerobic/resistive exercise along with prescribed medications to achieve LDL 70mg , HDL >40mg .    Expected Outcomes Short Term: Participant states understanding of desired cholesterol values and is compliant with medications prescribed. Participant is following exercise prescription and nutrition guidelines.;Long Term: Cholesterol controlled with medications as prescribed, with individualized exercise RX  and with personalized nutrition plan. Value goals: LDL < 70mg , HDL > 40 mg.    Stress Yes    Intervention Offer individual and/or small group education and counseling on adjustment to heart disease, stress management and health-related lifestyle change. Teach and support self-help strategies.;Refer participants experiencing significant psychosocial distress to appropriate mental health specialists for further evaluation and treatment. When possible, include family members and significant others in education/counseling sessions.    Expected Outcomes Short Term: Participant demonstrates changes in health-related behavior, relaxation and other stress management skills, ability to obtain effective social support, and compliance with psychotropic medications if prescribed.;Long Term: Emotional wellbeing is indicated by absence of clinically significant psychosocial distress or social isolation.          Core Components/Risk Factors/Patient Goals Review:    Core Components/Risk Factors/Patient Goals at Discharge (Final Review):    ITP Comments:  ITP Comments     Row Name 07/30/24 1018           ITP Comments Dr. Wilbert Bihari medical director. Introduction to [facility] education/intensive cardiac rehab. Initial orientation packet reviewed with patient.          Comments: Participant attended orientation for the cardiac rehabilitation program on  07/30/2024  to perform initial intake and exercise walk test. Patient introduced to the [facility] education and orientation packet was reviewed. Completed 6-minute walk test, measurements, initial ITP, and exercise prescription. Vital signs stable. Telemetry-normal sinus rhythm PVCs, asymptomatic. Pt resting BP 170/80, RN Carlette notified and clearance obtained from onsite provider Barnie Hila, NP. Pt with known history of white coat hypertension and normal home BP readings. See note from RN Carlette for further details regarding parameters.    Service time was from 1021 to 1235.     Con [name], MS, ACSM-CEP 07/30/2024 4:43 PM

## 2024-07-30 NOTE — Progress Notes (Signed)
 Cardiac Rehab Medication Review   Does the patient  feel that his/her medications are working for him/her? Yes    Has the patient been experiencing any side effects to the medications prescribed? Yes  Does the patient measure his/her own blood pressure or blood glucose at home?   Yes  Does the patient have any problems obtaining medications due to transportation or finances?   No  Understanding of regimen: excellent Understanding of indications: excellent Potential of compliance: excellent    Comments: Edgar Garcia understands his medications well. He checks his BP at home several times a week. He has had some itching which he attributes to the Eliquis .   Con [name], MS, ACSM-CEP 07/30/2024 11:34 AM

## 2024-07-30 NOTE — Progress Notes (Deleted)
 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 Documented elevation in BP referenced white coat syndrome. On Metoprolol  12.5 mg daily am and Amlodipine  2.5 mg at night ( recently added on 10/22) BP elevated this morning 170/80 Took meds admits to feeling nervous about the appt, 2 cups of coffee 1/2 caf brand. Reports much lower bp at home 120-140s ok to proceed with walk test? I can reach out to the provider for acceptable bp specific for this pt? Your thoughts??  11:21 AM Barnie Hila, NP I think he is safe to proceed with walk test. It does appear he is consistently high for appointments.   Walk test completed: Post walk test 198/88 post 186/84. Okay to let him go home? 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  Will also check to see ok to take the Amlodipine  dose in the morning along with his metoprolol .in the morning prior to exercise later in the day.  I will convey any messages from the provider to the pt.  Admits he does not pick up the phone generally and ok to leave detailed message.  Edgar Garcia, BSN Cardiac and Emergency Planning/management Officer

## 2024-07-31 ENCOUNTER — Telehealth (HOSPITAL_COMMUNITY): Payer: Self-pay | Admitting: *Deleted

## 2024-07-31 NOTE — Telephone Encounter (Signed)
 Called and left message for Edgar Garcia regarding taking his Amlodipine  2.5 mg at the same time with his Metoprolol  12.5 mg in the morning.  Detailed instructions given a requested by Norleen.  Contact information provided should he have any questions. Saturnino Bernett PEAK, BSN Cardiac and Emergency Planning/management Officer

## 2024-07-31 NOTE — Telephone Encounter (Signed)
-----   Message from [facility] sent at 07/31/2024  3:10 PM EST ----- Regarding: RE: BP parameters for Cardiac Rehab Yes. Please ask the patient to keep home BP log and contact us  sooner if SBP consistently stays >150 mmHg.  Thanks MJP ----- Message ----- From: Bernett Saturnino NOVAK, RN Sent: 07/31/2024   8:38 AM EST To: [name], MD Subject: RE: BP parameters for Cardiac Rehab             Thank you I will convey this message to Kainen.  Post 6 minute walk test BP-   198/88 post 186/84.  Admitted overstimulated with the music playing in the background.  Received ok from supervising provider for pt to be discharged.  Are you ok with an upper limit for his bp -  200/100 be appropriate for him with knowing anxiety plays a significant role?  [place] ----- Message ----- From: Lawrence Newman JINNY, MD Sent: 07/30/2024   4:48 PM EST To: Saturnino NOVAK Bernett, RN Subject: RE: BP parameters for Cardiac Rehab            Okay to take meds in the morning. Lets see how it does at next couple of visits.  Thanks MJP ----- Message ----- From: Bernett Saturnino NOVAK, RN Sent: 07/30/2024  11:51 AM EST To: [name], MD Subject: BP parameters for Cardiac Rehab                 Good Day!  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    Well documented in his post surgical follow up appts elevated BP due to whitecoat syndrome.  Recent addition on 10/22  Amlodipine  2.5 mg in the evening(pt believes he was on 5 mg pre surgery).  Also he takes Metoprolol  12.5 in the morning.  Took his meds as prescribes this morning along with 2 cups of 1/2 caf, coffee.  Admits to feeling nervous not knowing what may happen and this is earlier than he would like to be out.  Reassured Kevin.  Received ok from supervising provider Barnie Hila NP to complete walk test.  In moving forward  to next week with  the addition of  more rigorous exercise what is an acceptable BP for rest and on exertion for this pt?  Ok for him to take the Amlodipine  in the morning with his Metoprolol ?  Exercise time is 12:30  Thank you for your input Saturnino Bernett RN, BSN Cardiac and Pulmonary Rehab Nurse Navigator

## 2024-08-04 ENCOUNTER — Telehealth (HOSPITAL_COMMUNITY): Payer: Self-pay

## 2024-08-04 ENCOUNTER — Telehealth (HOSPITAL_COMMUNITY): Payer: Self-pay | Admitting: *Deleted

## 2024-08-04 ENCOUNTER — Encounter (HOSPITAL_COMMUNITY): Admission: RE | Admit: 2024-08-04 | Source: Ambulatory Visit

## 2024-08-04 NOTE — Telephone Encounter (Signed)
 Pt had called out due to back issue. Attempted call to patient per reuest. L/M on V/M. Will try call again tomorrow.  Alm Parkins MS, ACSM-CEP, CCRP

## 2024-08-04 NOTE — Telephone Encounter (Signed)
 Patient left message on department voicemail. He's having severe back problems and will be unable to start cardiac rehab today. He requested a call back at [phone]. Will forward message to patient's nurse case manager and exercise physiologist for follow-up and reschedule.

## 2024-08-04 NOTE — Telephone Encounter (Signed)
 Patient left message regarding his c/o for 12:30 CR class, states he has extreme back pain and is not sure how to proceed with cardiac rehab- would like someone to call him back.

## 2024-08-05 ENCOUNTER — Telehealth (HOSPITAL_COMMUNITY): Payer: Self-pay

## 2024-08-05 NOTE — Telephone Encounter (Signed)
 Spoke with patient regarding his back pain which he says can be severe at times. Pt was working on a bathroom ceiling and it started to bother him after that. We will cancel the remaining appts for this week and follow up Friday afternoon to determine how he is feeling. Voices cannot get in see PCP until January. Pt agrees to this plan.  Alm Parkins MS, ACSM-CEP, CCRP

## 2024-08-06 ENCOUNTER — Encounter (HOSPITAL_COMMUNITY)

## 2024-08-07 ENCOUNTER — Other Ambulatory Visit: Payer: Self-pay | Admitting: Physician Assistant

## 2024-08-07 NOTE — Progress Notes (Signed)
 Subjective Patient ID: Edgar Garcia is a 73 y.o. male.  Chief Complaint  Patient presents with  . Back Pain    Pt reports mid, bilat back pain.  States that last week he was doing some repairs at home.  Last Friday he began having some muscular pain around lower ribs and now it radiates to back.  Pt has been using Tylenol  as directed and has had no relief.  Pt prefers to stand.  Difficult to sit.  Does state CABG 3 months ago but is not having any chest discomfort.  Some tingling and numbness down back of legs.  No acute injury reported.      The following information was reviewed by members of the visit team:  Tobacco  Allergies  Meds  Problems  Med Hx  Surg Hx  Fam Hx      Pt is a 73 yo with hx of CABG, HTN, other chronic medical problems here for rib pain/back pain, states started after painting a ceiling. Reports pain is worse with twisting/bending. Reproducible with palpation. Denies CP/SOB, or other pertinent symptoms, stable and in NAD    Review of Systems  Musculoskeletal:  Positive for back pain.  All other systems reviewed and are negative.   Objective Physical Exam Vitals and nursing note reviewed.  Constitutional:      General: He is not in acute distress.    Appearance: He is normal weight.  HENT:     Head: Normocephalic.     Nose: Nose normal.     Mouth/Throat:     Mouth: Mucous membranes are moist.  Eyes:     Extraocular Movements: Extraocular movements intact.     Pupils: Pupils are equal, round, and reactive to light.  Pulmonary:     Effort: Pulmonary effort is normal.  Musculoskeletal:        General: Tenderness present. Normal range of motion.     Thoracic back: Spasms and tenderness present. No bony tenderness. Normal range of motion.       Back:  Skin:    General: Skin is warm.     Capillary Refill: Capillary refill takes less than 2 seconds.  Neurological:     General: No focal deficit present.     Mental Status: He is alert and oriented  to person, place, and time.  Psychiatric:        Mood and Affect: Mood normal.        Behavior: Behavior normal.     Assessment/Plan Diagnoses and all orders for this visit:  Spasm of thoracic back muscle -     predniSONE (DELTASONE) 20 mg tablet; Take 2 tablets (40 mg total) by mouth daily for 5 days. -     methocarbamoL (ROBAXIN) 500 mg tablet; Take 1 tablet (500 mg total) by mouth 4 (four) times a day for 10 days.  Other orders -     amLODIPine  (NORVASC ) 2.5 mg tablet; Take 1 tablet at night    DDX: strain, sprain, radiculopathy, fracture, osteoarthritis, scoliosis, degenerative disc disease,   MDM: Pt here for thoracic back spasms, rib pain, reproducible with movement/palpation, likely musculoskeletal given it was after repetitive activity. No chest pain or red flags here; given he is on eliquis  cannot take ibuprofen so will do short course pred/methocarbamol for pain, pt agreeable. Discussed symptomatic treatment as well as follow up, advised to see PCP in 3-4 days for reassessment,  advised on red flags warranting ER evaluation, pt verbalized understanding of all instructions, agreeable  to plan of care, stable at departure.  Urgent Care Disposition:  Home Care    Electronically signed: Rexene Charlies Pouch, NP 08/07/2024  4:40 PM

## 2024-08-08 ENCOUNTER — Encounter (HOSPITAL_COMMUNITY)

## 2024-08-08 NOTE — Progress Notes (Signed)
 Please call the patient to report the following: X-ray of the lumbar spine shows very mild scoliosis with wear and tear changes.  In addition, it appears you were fairly significantly constipated when the x-ray was obtained.  I recommend starting docusate sodium  as a stool softener while increasing water consumption.  If this does not help with more significant bowel movement, can do 1 capful of MiraLAX  nightly until significant bowel movement occurs. X-ray of the left hip also shows wear and tear changes and slightly decreased bone density.  Due to your cardiac medications, I do not recommend taking NSAIDs such as ibuprofen or naproxen.  You may find benefit from Tylenol  arthritis.  As far as neck steps for treatment, I recommend seeing physical therapy or a spine specialist.  Please let me know if you are willing to see either specialist, and I am happy to place referral.

## 2024-08-11 ENCOUNTER — Encounter (HOSPITAL_COMMUNITY): Admission: RE | Admit: 2024-08-11 | Source: Ambulatory Visit

## 2024-08-11 ENCOUNTER — Telehealth (HOSPITAL_COMMUNITY): Payer: Self-pay

## 2024-08-11 NOTE — Telephone Encounter (Signed)
 Patient c/o for 12:30 CR classes for the week, states he is still having extreme back pain. Patient has not started his 1st day of class yet, would like to speak to [place]. He states he is still 'immobile'.

## 2024-08-11 NOTE — Telephone Encounter (Signed)
 Returned call to patient regarding on-going back issues. Pt was seen in urgent care and vice anti-inflammatories and muscle relaxer. Pt will call next Monday morning to let us  know if he is better.

## 2024-08-13 ENCOUNTER — Encounter (HOSPITAL_COMMUNITY)

## 2024-08-15 ENCOUNTER — Encounter (HOSPITAL_COMMUNITY)

## 2024-08-18 ENCOUNTER — Telehealth: Payer: Self-pay | Admitting: Cardiology

## 2024-08-18 ENCOUNTER — Encounter (HOSPITAL_COMMUNITY)

## 2024-08-18 NOTE — Telephone Encounter (Signed)
 Pt wanted to make office aware that he hasn't been able to start the zio monitor or cardiac rehab due to him having chronic bad pain. He'd like to speak with a nurse to see how he should progress since receiving a message about his prescription for the monitor running out and so it's almost time for him to send the monitor back. Please advise.

## 2024-08-18 NOTE — Telephone Encounter (Signed)
 OK per DPR.  Left message on home #. - OK to use monitor per [facility] - monitor tech.  Requested he call back if any further questions or concerns.

## 2024-08-19 ENCOUNTER — Telehealth (HOSPITAL_COMMUNITY): Payer: Self-pay

## 2024-08-19 ENCOUNTER — Encounter (HOSPITAL_COMMUNITY): Payer: Self-pay

## 2024-08-19 DIAGNOSIS — Z951 Presence of aortocoronary bypass graft: Secondary | ICD-10-CM

## 2024-08-19 NOTE — Progress Notes (Signed)
 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 Swallow whole., Disp: , Rfl:    atorvastatin  (LIPITOR ) 80 MG tablet, Take 1 tablet (80 mg total) by mouth daily., Disp: 60 tablet, Rfl: 1   metoprolol  succinate (TOPROL -XL) 25 MG 24 hr tablet, Take 0.5 tablets (12.5 mg total) by mouth daily., Disp: 30 tablet, Rfl: 1   pantoprazole  (PROTONIX ) 40 MG tablet, Take 1 tablet (40 mg total) by mouth 2 (two) times daily before a meal., Disp: 60 tablet, Rfl: 1   vitamin B-12 (CYANOCOBALAMIN) 1000 MCG tablet, Take 1,000 mcg by mouth in the morning., Disp: , Rfl:   Past Medical History: Past Medical History:  Diagnosis Date   Anginal pain    Anxiety    Arthritis    CAD (coronary artery disease)    07/26/2021 unsuccessful PCI mLAD - went into Vfib on the table   GERD (gastroesophageal reflux disease)    Hyperlipidemia    Hypertension    Myocardial infarction (HCC)    Stroke (HCC)    TIA 2010 - No deficits   Substance  abuse (HCC)    Hx of ETOH abuse. No alcohol in a few months as of 2025   TIA (transient ischemic attack)     Tobacco Use: Social History   Tobacco Use  Smoking Status Former   Current packs/day: 0.00   Average packs/day: 0.5 packs/day for 25.0 years (12.5 ttl pk-yrs)   Types: Cigarettes   Start date: 17   Quit date: 2013   Years since quitting: 12.9  Smokeless Tobacco Never    Labs: Review Flowsheet  More data exists      Latest Ref Rng & Units 10/12/2023 05/06/2024 05/08/2024 05/10/2024 07/10/2024  Labs for ITP Cardiac and Pulmonary Rehab  Cholestrol 0 - 200 mg/dL 855  - - 66  -  LDL (calc) 0 - 99 mg/dL 57  - - 23  -  Direct LDL 0 - 99 mg/dL - - - - 55   HDL-C >59 mg/dL 76  - - 35  -  Trlycerides <150 mg/dL 52  - - 42  -  Hemoglobin A1c 4.8 - 5.6 % - 5.3  - - -  PH, Arterial 7.35 - 7.45 - - 7.356  7.340  7.450  7.360  - -  PCO2 arterial 32 - 48 mmHg - - 39.4  37.6  32.3  36.5  - -  Bicarbonate 20.0 - 28.0 mmol/L - -  22.1  20.7  23.3  20.6  - -  TCO2 22 - 32 mmol/L - - 23  22  24  22  24  22  25   - -  Acid-base deficit 0.0 - 2.0 mmol/L - - 3.0  5.0  1.0  4.0  - -  O2 Saturation % - - 97  99  97  100  - -    Details       Multiple values from one day are sorted in reverse-chronological order          Exercise Target Goals: Exercise Program Goal: Individual exercise prescription set using results from initial 6 min walk test and THRR while considering  patient's activity barriers and safety.   Exercise Prescription Goal: Initial exercise prescription builds to 30-45 minutes a day of aerobic activity, 2-3 days per week.  Home exercise guidelines will be given to patient during program as part of exercise prescription that the participant will acknowledge.   Education: Aerobic Exercise: - Group verbal and visual presentation on the components of exercise prescription. Introduces F.I.T.T principle from ACSM for exercise prescriptions.  Reviews F.I.T.T. principles of  aerobic exercise including progression. Written material provided at class time.   Education: Resistance Exercise: - Group verbal and visual presentation on the components of exercise prescription. Introduces F.I.T.T principle from ACSM for exercise prescriptions  Reviews F.I.T.T. principles of resistance exercise including progression. Written material provided at class time.    Education: Exercise & Equipment Safety: - Individual verbal instruction and demonstration of equipment use and safety with use of the equipment.   Education: Exercise Physiology & General Exercise Guidelines: - Group verbal and written instruction with models to review the exercise physiology of the cardiovascular system and associated critical values. Provides general exercise guidelines with specific guidelines to those with heart or lung disease. Written material provided at class time.   Education: Flexibility, Balance, Mind/Body Relaxation: - Group verbal and visual presentation with interactive activity on the components of exercise prescription. Introduces F.I.T.T principle from ACSM for exercise prescriptions. Reviews F.I.T.T. principles of flexibility and balance exercise training including progression. Also discusses the mind body connection.  Reviews various relaxation techniques to help reduce and manage stress (i.e. Deep breathing, progressive muscle relaxation, and visualization). Balance handout provided to take home. Written material provided at class time.   Activity Barriers & Risk Stratification:  Activity Barriers & Cardiac Risk Stratification - 07/30/24 1121       Activity Barriers & Cardiac Risk Stratification   Activity Barriers Arthritis;Neck/Spine Problems;Balance Concerns    Cardiac Risk Stratification High   <5 METs on         6 Minute Walk:  6 Minute Walk     Row Name 07/30/24 1635         6 Minute Walk   Phase Initial     Distance 1255 feet     Walk Time 6 minutes      # of Rest Breaks 0     MPH 2.38     METS 2.96     RPE 13     Perceived Dyspnea  0     VO2 Peak 10.37     Symptoms No     Resting HR 64 bpm     Resting BP 170/80     Resting Oxygen Saturation  99 %     Exercise Oxygen Saturation  during 6 min walk 99 %     Max Ex. HR 86 bpm  Max Ex. BP 198/88     2 Minute Post BP 188/80        Oxygen Initial Assessment:   Oxygen Re-Evaluation:   Oxygen Discharge (Final Oxygen Re-Evaluation):   Initial Exercise Prescription:  Initial Exercise Prescription - 07/30/24 1100       Date of Initial Exercise RX and Referring Provider   Date 07/30/24    Referring Provider Newman Lawrence, MD    Expected Discharge Date 10/22/24      Bike   Level 1    Watts 60    Minutes 15    METs 2.5      Track   Laps 14    Minutes 15    METs 2.2      Prescription Details   Frequency (times per week) 3    Duration Progress to 30 minutes of continuous aerobic without signs/symptoms of physical distress      Intensity   THRR 40-80% of Max Heartrate 58-116    Ratings of Perceived Exertion 11-13    Perceived Dyspnea 0-4      Progression   Progression Continue progressive overload as per policy without signs/symptoms or physical distress.      Resistance Training   Training Prescription Yes    Weight 3    Reps 10-15          Perform Capillary Blood Glucose checks as needed.  Exercise Prescription Changes:   Exercise Comments:   Exercise Goals and Review:   Exercise Goals     Row Name 07/30/24 1123             Exercise Goals   Increase Physical Activity Yes       Intervention Provide advice, education, support and counseling about physical activity/exercise needs.;Develop an individualized exercise prescription for aerobic and resistive training based on initial evaluation findings, risk stratification, comorbidities and participant's personal goals.       Expected Outcomes Short Term: Attend rehab on a regular basis to  increase amount of physical activity.;Long Term: Exercising regularly at least 3-5 days a week.;Long Term: Add in home exercise to make exercise part of routine and to increase amount of physical activity.       Increase Strength and Stamina Yes       Intervention Develop an individualized exercise prescription for aerobic and resistive training based on initial evaluation findings, risk stratification, comorbidities and participant's personal goals.;Provide advice, education, support and counseling about physical activity/exercise needs.       Expected Outcomes Long Term: Improve cardiorespiratory fitness, muscular endurance and strength as measured by increased METs and functional capacity ( );Short Term: Perform resistance training exercises routinely during rehab and add in resistance training at home;Short Term: Increase workloads from initial exercise prescription for resistance, speed, and METs.       Able to understand and use rate of perceived exertion (RPE) scale Yes       Intervention Provide education and explanation on how to use RPE scale       Expected Outcomes Short Term: Able to use RPE daily in rehab to express subjective intensity level;Long Term:  Able to use RPE to guide intensity level when exercising independently       Knowledge and understanding of Target Heart Rate Range (THRR) Yes       Intervention Provide education and explanation of THRR including how the numbers were predicted and where they are located for reference       Expected Outcomes Long Term: Able to  use THRR to govern intensity when exercising independently;Short Term: Able to state/look up THRR;Short Term: Able to use daily as guideline for intensity in rehab       Understanding of Exercise Prescription Yes       Intervention Provide education, explanation, and written materials on patient's individual exercise prescription       Expected Outcomes Short Term: Able to explain program exercise prescription;Long  Term: Able to explain home exercise prescription to exercise independently          Exercise Goals Re-Evaluation :   Discharge Exercise Prescription (Final Exercise Prescription Changes):   Nutrition:  Target Goals: Understanding of nutrition guidelines, daily intake of sodium 1500mg , cholesterol 200mg , calories 30% from fat and 7% or less from saturated fats, daily to have 5 or more servings of fruits and vegetables.  Education: Nutrition 1 -Group instruction provided by verbal, written material, interactive activities, discussions, models, and posters to present general guidelines for heart healthy nutrition including macronutrients, label reading, and promoting whole foods over processed counterparts. Education serves as pensions consultant of discussion of heart healthy eating for all. Written material provided at class time.    Education: Nutrition 2 -Group instruction provided by verbal, written material, interactive activities, discussions, models, and posters to present general guidelines for heart healthy nutrition including sodium, cholesterol, and saturated fat. Providing guidance of habit forming to improve blood pressure, cholesterol, and body weight. Written material provided at class time.     Biometrics:  Pre Biometrics - 07/30/24 1131       Pre Biometrics   Waist Circumference 38 inches    Hip Circumference 40 inches    Waist to Hip Ratio 0.95 %    Triceps Skinfold 18 mm    % Body Fat 26.9 %    Grip Strength 30 kg    Flexibility 10.62 in    Single Leg Stand 10.62 seconds           Nutrition Therapy Plan and Nutrition Goals:   Nutrition Assessments:  MEDIFICTS Score Key: >=70 Need to make dietary changes  40-70 Heart Healthy Diet <= 40 Therapeutic Level Cholesterol Diet   Picture Your Plate Scores: <59 Unhealthy dietary pattern with much room for improvement. 41-50 Dietary pattern unlikely to meet recommendations for good health and room for  improvement. 51-60 More healthful dietary pattern, with some room for improvement.  >60 Healthy dietary pattern, although there may be some specific behaviors that could be improved.    Nutrition Goals Re-Evaluation:   Nutrition Goals Discharge (Final Nutrition Goals Re-Evaluation):   Psychosocial: Target Goals: Acknowledge presence or absence of significant depression and/or stress, maximize coping skills, provide positive support system. Participant is able to verbalize types and ability to use techniques and skills needed for reducing stress and depression.   Education: Stress, Anxiety, and Depression - Group verbal and visual presentation to define topics covered.  Reviews how body is impacted by stress, anxiety, and depression.  Also discusses healthy ways to reduce stress and to treat/manage anxiety and depression. Written material provided at class time.   Education: Sleep Hygiene -Provides group verbal and written instruction about how sleep can affect your health.  Define sleep hygiene, discuss sleep cycles and impact of sleep habits. Review good sleep hygiene tips.   Initial Review & Psychosocial Screening:  Initial Psych Review & Screening - 07/30/24 1127       Initial Review   Current issues with None Identified      Family Dynamics  Good Support System? Yes   wife   Comments Edgar Garcia denies any depression/stress/anxiety currently. He did share that he has had depression/anxiety in the past and still will have his moments where they return. However, he denies any current issues or need for additional resources at this time. 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      Barriers   Psychosocial barriers to participate in program The patient should benefit from training in stress management and relaxation.      Screening Interventions   Interventions Encouraged to exercise;Provide feedback about the scores to participant    Expected Outcomes Long  Term goal: The participant improves quality of Life and PHQ9 Scores as seen by post scores and/or verbalization of changes;Short Term goal: Identification and review with participant of any Quality of Life or Depression concerns found by scoring the questionnaire.;Long Term Goal: Stressors or current issues are controlled or eliminated.          Quality of Life Scores:   Quality of Life - 07/30/24 1640       Quality of Life   Select Quality of Life      Quality of Life Scores   Health/Function Pre 22.86 %    Socioeconomic Pre 24.43 %    Psych/Spiritual Pre 21.3 %    Family Pre 25 %    GLOBAL Pre 23.13 %         Scores of 19 and below usually indicate a poorer quality of life in these areas.  A difference of  2-3 points is a clinically meaningful difference.  A difference of 2-3 points in the total score of the Quality of Life Index has been associated with significant improvement in overall quality of life, self-image, physical symptoms, and general health in studies assessing change in quality of life.  PHQ-9: Review Flowsheet       07/30/2024  Depression screen PHQ 2/9  Decreased Interest 0  Down, Depressed, Hopeless 0  PHQ - 2 Score 0  Altered sleeping 3  Tired, decreased energy 1  Change in appetite 0  Feeling bad or failure about yourself  0  Trouble concentrating 0  Moving slowly or fidgety/restless 0  Suicidal thoughts 0  PHQ-9 Score 4   Interpretation of Total Score  Total Score Depression Severity:  1-4 = Minimal depression, 5-9 = Mild depression, 10-14 = Moderate depression, 15-19 = Moderately severe depression, 20-27 = Severe depression   Psychosocial Evaluation and Intervention:   Psychosocial Re-Evaluation:  Psychosocial Re-Evaluation     Row Name 08/19/24 1436             Psychosocial Re-Evaluation   Current issues with None Identified       Interventions Encouraged to attend Cardiac Rehabilitation for the exercise       Continue  Psychosocial Services  Follow up required by staff          Psychosocial Discharge (Final Psychosocial Re-Evaluation):  Psychosocial Re-Evaluation - 08/19/24 1436       Psychosocial Re-Evaluation   Current issues with None Identified    Interventions Encouraged to attend Cardiac Rehabilitation for the exercise    Continue Psychosocial Services  Follow up required by staff          Vocational Rehabilitation: Provide vocational rehab assistance to qualifying candidates.   Vocational Rehab Evaluation & Intervention:  Vocational Rehab - 07/30/24 1124       Initial Vocational Rehab Evaluation & Intervention   Assessment  shows need for Vocational Rehabilitation No   retired education administrator         Education: Education Goals: Education classes will be provided on a variety of topics geared toward better understanding of heart health and risk factor modification. Participant will state understanding/return demonstration of topics presented as noted by education test scores.  Learning Barriers/Preferences:  Learning Barriers/Preferences - 07/30/24 1124       Learning Barriers/Preferences   Learning Barriers None    Learning Preferences Audio;Computer/Internet;Group Instruction;Individual Instruction;Skilled Demonstration;Verbal Instruction;Video;Written Material;Pictoral          General Cardiac Education Topics:  AED/CPR: - Group verbal and written instruction with the use of models to demonstrate the basic use of the AED with the basic ABC's of resuscitation.   Test and Procedures: - Group verbal and visual presentation and models provide information about basic cardiac anatomy and function. Reviews the testing methods done to diagnose heart disease and the outcomes of the test results. Describes the treatment choices: Medical Management, Angioplasty, or Coronary Bypass Surgery for treating various heart conditions including Myocardial Infarction, Angina, Valve Disease, and  Cardiac Arrhythmias. Written material provided at class time.   Medication Safety: - Group verbal and visual instruction to review commonly prescribed medications for heart and lung disease. Reviews the medication, class of the drug, and side effects. Includes the steps to properly store meds and maintain the prescription regimen. Written material provided at class time.   Intimacy: - Group verbal instruction through game format to discuss how heart and lung disease can affect sexual intimacy. Written material provided at class time.   Know Your Numbers and Heart Failure: - Group verbal and visual instruction to discuss disease risk factors for cardiac and pulmonary disease and treatment options.  Reviews associated critical values for Overweight/Obesity, Hypertension, Cholesterol, and Diabetes.  Discusses basics of heart failure: signs/symptoms and treatments.  Introduces Heart Failure Zone chart for action plan for heart failure. Written material provided at class time.   Infection Prevention: - Provides verbal and written material to individual with discussion of infection control including proper hand washing and proper equipment cleaning during exercise session.   Falls Prevention: - Provides verbal and written material to individual with discussion of falls prevention and safety.   Other: -Provides group and verbal instruction on various topics (see comments)   Knowledge Questionnaire Score:  Knowledge Questionnaire Score - 07/30/24 1136       Knowledge Questionnaire Score   Pre Score 18/24          Core Components/Risk Factors/Patient Goals at Admission:  Personal Goals and Risk Factors at Admission - 07/30/24 1125       Core Components/Risk Factors/Patient Goals on Admission    Weight Management Yes;Weight Maintenance    Intervention Weight Management: Develop a combined nutrition and exercise program designed to reach desired caloric intake, while maintaining  appropriate intake of nutrient and fiber, sodium and fats, and appropriate energy expenditure required for the weight goal.;Weight Management: Provide education and appropriate resources to help participant work on and attain dietary goals.    Expected Outcomes Short Term: Continue to assess and modify interventions until short term weight is achieved;Long Term: Adherence to nutrition and physical activity/exercise program aimed toward attainment of established weight goal;Weight Maintenance: Understanding of the daily nutrition guidelines, which includes 25-35% calories from fat, 7% or less cal from saturated fats, less than 200mg  cholesterol, less than 1.5gm of sodium, & 5 or more servings of fruits and vegetables daily;Understanding recommendations for meals  to include 15-35% energy as protein, 25-35% energy from fat, 35-60% energy from carbohydrates, less than 200mg  of dietary cholesterol, 20-35 gm of total fiber daily;Understanding of distribution of calorie intake throughout the day with the consumption of 4-5 meals/snacks    Hypertension Yes    Intervention Provide education on lifestyle modifcations including regular physical activity/exercise, weight management, moderate sodium restriction and increased consumption of fresh fruit, vegetables, and low fat dairy, alcohol moderation, and smoking cessation.;Monitor prescription use compliance.    Expected Outcomes Long Term: Maintenance of blood pressure at goal levels.;Short Term: Continued assessment and intervention until BP is < 140/39mm HG in hypertensive participants. < 130/63mm HG in hypertensive participants with diabetes, heart failure or chronic kidney disease.    Lipids Yes    Intervention Provide education and support for participant on nutrition & aerobic/resistive exercise along with prescribed medications to achieve LDL 70mg , HDL >40mg .    Expected Outcomes Short Term: Participant states understanding of desired cholesterol values and is  compliant with medications prescribed. Participant is following exercise prescription and nutrition guidelines.;Long Term: Cholesterol controlled with medications as prescribed, with individualized exercise RX and with personalized nutrition plan. Value goals: LDL < 70mg , HDL > 40 mg.    Stress Yes    Intervention Offer individual and/or small group education and counseling on adjustment to heart disease, stress management and health-related lifestyle change. Teach and support self-help strategies.;Refer participants experiencing significant psychosocial distress to appropriate mental health specialists for further evaluation and treatment. When possible, include family members and significant others in education/counseling sessions.    Expected Outcomes Short Term: Participant demonstrates changes in health-related behavior, relaxation and other stress management skills, ability to obtain effective social support, and compliance with psychotropic medications if prescribed.;Long Term: Emotional wellbeing is indicated by absence of clinically significant psychosocial distress or social isolation.          Education:Diabetes - Individual verbal and written instruction to review signs/symptoms of diabetes, desired ranges of glucose level fasting, after meals and with exercise. Acknowledge that pre and post exercise glucose checks will be done for 3 sessions at entry of program.   Core Components/Risk Factors/Patient Goals Review:   Goals and Risk Factor Review     Row Name 08/19/24 1437             Core Components/Risk Factors/Patient Goals Review   Personal Goals Review Weight Management/Obesity;Hypertension;Lipids;Stress       Review Edgar Garcia has yet to attend his first cardiac rehab exercise class d/t back pain.  See PCP note from 08/13/24       Expected Outcomes Edgar Garcia will begin and continue to particpate in cardiac rehab for exercise nutriton and lifestyle modifications.          Core  Components/Risk Factors/Patient Goals at Discharge (Final Review):   Goals and Risk Factor Review - 08/19/24 1437       Core Components/Risk Factors/Patient Goals Review   Personal Goals Review Weight Management/Obesity;Hypertension;Lipids;Stress    Review Edgar Garcia has yet to attend his first cardiac rehab exercise class d/t back pain.  See PCP note from 08/13/24    Expected Outcomes Edgar Garcia will begin and continue to particpate in cardiac rehab for exercise nutriton and lifestyle modifications.          ITP Comments:  ITP Comments     Row Name 07/30/24 1018 08/19/24 1431         ITP Comments Dr. Wilbert Bihari medical director. Introduction to pritikin education/intensive cardiac rehab. Initial orientation packet  reviewed with patient. Edgar Garcia has not yet attended his first cardiac rehab exercise session due to back pain.  See PCP note from 08/13/24.         Comments: see ITP comments

## 2024-08-19 NOTE — Telephone Encounter (Signed)
 Called patient to follow-up on how his back is. Pt still with pain and limited mobility. We have a agreed to discharge from the [facility]2 program. He will request Dr. Patwrhan to reorder when he is feeling better.   Alm Parkins MS, ACSM-CEP, CCRP

## 2024-08-20 ENCOUNTER — Encounter (HOSPITAL_COMMUNITY)

## 2024-08-22 ENCOUNTER — Encounter (HOSPITAL_COMMUNITY)

## 2024-08-25 ENCOUNTER — Encounter (HOSPITAL_COMMUNITY)

## 2024-08-26 ENCOUNTER — Ambulatory Visit: Admitting: Gastroenterology

## 2024-08-26 ENCOUNTER — Encounter: Payer: Self-pay | Admitting: Gastroenterology

## 2024-08-26 VITALS — BP 138/86 | HR 74 | Ht 68.0 in | Wt 172.0 lb

## 2024-08-26 DIAGNOSIS — I4891 Unspecified atrial fibrillation: Secondary | ICD-10-CM

## 2024-08-26 DIAGNOSIS — Z951 Presence of aortocoronary bypass graft: Secondary | ICD-10-CM

## 2024-08-26 DIAGNOSIS — K2100 Gastro-esophageal reflux disease with esophagitis, without bleeding: Secondary | ICD-10-CM

## 2024-08-26 NOTE — Patient Instructions (Addendum)
 Decrease your pantoprazole  to once daily x 1 week, then reduce to every other day x 2 weeks then stop.   You can take over the counter Tums or Pepcid for any breakthrough symptoms.   _______________________________________________________  If your blood pressure at your visit was 140/90 or greater, please contact your primary care physician to follow up on this.  _______________________________________________________  If you are age 75 or older, your body mass index should be between 23-30. Your Body mass index is 26.15 kg/m. If this is out of the aforementioned range listed, please consider follow up with your Primary Care Provider.  If you are age 26 or younger, your body mass index should be between 19-25. Your Body mass index is 26.15 kg/m. If this is out of the aformentioned range listed, please consider follow up with your Primary Care Provider.   ________________________________________________________  The [facility] providers would like to encourage you to use MYCHART to communicate with providers for non-urgent requests or questions.  Due to long hold times on the telephone, sending your provider a message by [facility] may be a faster and more efficient way to get a response.  Please allow 48 business hours for a response.  Please remember that this is for non-urgent requests.  _______________________________________________________  [facility] is using a team-based approach to care.  Your team is made up of your doctor and two to three APPS. Our APPS (Nurse Practitioners and Physician Assistants) work with your physician to ensure care continuity for you. They are fully qualified to address your health concerns and develop a treatment plan. They communicate directly with your gastroenterologist to care for you. Seeing the Advanced Practice Practitioners on your physician's team can help you by facilitating care more promptly, often allowing for earlier appointments,  access to diagnostic testing, procedures, and other specialty referrals.

## 2024-08-26 NOTE — Progress Notes (Signed)
 Discussed the use of AI scribe software for clinical note transcription with the patient, who gave verbal consent to proceed.  HPI : Edgar Garcia is a 73 year old male with a history of coronary artery disease status post CABG in August of this year who presents for a follow-up regarding pantoprazole  use.  He has been on pantoprazole  since March 2023 after an upper endoscopy revealed LA grade a reflux esophagitis and mild gastric irritation. Prior to pantoprazole , he experienced upper abdominal pain, which has since resolved. He currently takes pantoprazole  40 mg twice daily and reports no current abdominal pain or significant reflux symptoms.  He has a history of atrial fibrillation post-surgery and has been on Eliquis  for the past three months. He is wearing a heart monitor. He also takes aspirin  due to his history of coronary artery disease.  He recently experienced severe back pain, which was managed with cortisone injections and Tylenol . Tylenol  was initially ineffective, but he is now back to normal.  He quit smoking and drinking alcohol a long time ago. He experienced constipation following heart surgery and again at the onset of his back pain, but his bowel movements have normalized without the need for ongoing laxatives. He used Miralax  twice initially but has not needed it since.   He has a history of numerous colon polyps, with his last colonoscopy in 2024 with a 10 mm cecal adenoma removed.  He was recommended to repeat colonoscopy in June 2027.      Colonoscopy 11/29/21: - Preparation of the colon was poor. - One 14 mm polyp in the rectum, removed with a hot snare. Resected and retrieved. Tattooed. - Three 2 to 4 mm polyps in the rectum, removed with a cold snare. Resected and retrieved. - Two less than 1 mm polyps in the sigmoid colon, removed with a cold biopsy forceps. Resected and retrieved. - One 3 mm polyp in the sigmoid colon, removed with a cold snare. Resected and  retrieved. - One 5 mm polyp at the hepatic flexure, removed with a cold snare. Resected and retrieved. - The examination was otherwise normal on direct and retroflexion views.   Pathology showed all tubular adenomas. Repeat colonoscopy with 2 day bowel prep recommended.    EGD 11/29/21: - LA Grade A reflux esophagitis with no bleeding. Biopsies confirmed reflux esophagitis. - Gastritis. Biopsies showed reactive gastropathy. Fundic gland polyps present.  - Normal examined duodenum.   Colonoscopy 03/06/2023 Difficult colonoscopy due to excessive loooping, 11 minute cecal intubation time - One 10 mm polyp in the cecum, removed with a cold snare. Resected and retrieved.  - One 4 mm polyp in the ascending colon, removed with a cold snare. Resected and retrieved.  - A tattoo was seen in the rectum. A post-polypectomy scar was found at the tattoo site. There was no evidence of residual polyp tissue.  - The distal rectum and anal verge are normal on retroflexion view.   Surgical [P], colon, cecum and ascending, polyp (2) - TUBULAR ADENOMA(S) (MULTIPLE FRAGMENTS) - NEGATIVE FOR HIGH-GRADE DYSPLASIA OR MALIGNANCY  Recommended to repeat colonoscopy in 3 years.   Past Medical History:  Diagnosis Date   Anginal pain    Anxiety    Arthritis    CAD (coronary artery disease)    07/26/2021 unsuccessful PCI mLAD - went into Vfib on the table   GERD (gastroesophageal reflux disease)    Hyperlipidemia    Hypertension    Myocardial infarction [facility])    Stroke (  HCC)    TIA 2010 - No deficits   Substance abuse (HCC)    Hx of ETOH abuse. No alcohol in a few months as of 2025   TIA (transient ischemic attack)      Past Surgical History:  Procedure Laterality Date   COLONOSCOPY     CORONARY ARTERY BYPASS GRAFT N/A 05/08/2024   Procedure: OFF PUMP CORONARY ARTERY BYPASS GRAFTING (CABG) TIMES TWO, USING LEFT INTERNAL MAMMARY ARTERY AND RIGHT LEG GREATER SAPHENOUS VEIN HARVESTED ENDOSCOPICALLY;   Surgeon: Shyrl Linnie KIDD, MD;  Location: [facility] OR;  Service: Open Heart Surgery;  Laterality: N/A;   CORONARY BALLOON ANGIOPLASTY N/A 07/26/2021   Procedure: CORONARY BALLOON ANGIOPLASTY;  Surgeon: Elmira Newman PARAS, MD;  Location: [facility] INVASIVE CV LAB;  Service: Cardiovascular;  Laterality: N/A;   LEFT HEART CATH AND CORONARY ANGIOGRAPHY N/A 07/26/2021   Procedure: LEFT HEART CATH AND CORONARY ANGIOGRAPHY;  Surgeon: Elmira Newman PARAS, MD;  Location: [facility] INVASIVE CV LAB;  Service: Cardiovascular;  Laterality: N/A;   LEFT HEART CATH AND CORONARY ANGIOGRAPHY N/A 02/28/2024   Procedure: LEFT HEART CATH AND CORONARY ANGIOGRAPHY;  Surgeon: Elmira Newman PARAS, MD;  Location: [facility] INVASIVE CV LAB;  Service: Cardiovascular;  Laterality: N/A;   TEE WITHOUT CARDIOVERSION N/A 05/08/2024   Procedure: ECHOCARDIOGRAM, TRANSESOPHAGEAL;  Surgeon: Shyrl Linnie KIDD, MD;  Location: [facility] OR;  Service: Open Heart Surgery;  Laterality: N/A;   Family History  Problem Relation Age of Onset   Cancer Mother    Hypertension Sister    Hypertension Sister    Hypertension Sister    Colon polyps Brother    Hypertension Brother    Colon cancer Neg Hx    Esophageal cancer Neg Hx    Rectal cancer Neg Hx    Stomach cancer Neg Hx    Social History   Tobacco Use   Smoking status: Former    Current packs/day: 0.00    Average packs/day: 0.5 packs/day for 25.0 years (12.5 ttl pk-yrs)    Types: Cigarettes    Start date: 59    Quit date: 2013    Years since quitting: 12.9   Smokeless tobacco: Never  Vaping Use   Vaping status: Never Used  Substance Use Topics   Alcohol use: Not Currently    Comment: Stopped drinking 2025 (at most a 6 pack per day)   Drug use: No   Current Outpatient Medications  Medication Sig Dispense Refill   acetaminophen  (TYLENOL ) 325 MG tablet Take 2 tablets (650 mg total) by mouth every 6 (six) hours as needed for mild pain (pain score 1-3).     amLODipine  (NORVASC ) 2.5 MG tablet  Take 1 tablet at night 180 tablet 3   apixaban  (ELIQUIS ) 5 MG TABS tablet Take 1 tablet (5 mg total) by mouth 2 (two) times daily. 60 tablet 5   aspirin  EC 81 MG tablet Take 1 tablet (81 mg total) by mouth daily. Swallow whole.     atorvastatin  (LIPITOR ) 80 MG tablet Take 1 tablet (80 mg total) by mouth daily. 60 tablet 1   metoprolol  succinate (TOPROL -XL) 25 MG 24 hr tablet Take 0.5 tablets (12.5 mg total) by mouth daily. 30 tablet 1   pantoprazole  (PROTONIX ) 40 MG tablet Take 1 tablet (40 mg total) by mouth 2 (two) times daily before a meal. 60 tablet 1   vitamin B-12 (CYANOCOBALAMIN) 1000 MCG tablet Take 1,000 mcg by mouth in the morning.     No current facility-administered medications for this visit.  Allergies  Allergen Reactions   Brilinta  [Ticagrelor ] Other (See Comments)    Headaches    Isordil  [Isosorbide  Nitrate] Other (See Comments)    Headaches     Review of Systems: All systems reviewed and negative except where noted in HPI.    No results found.  Physical Exam: BP 138/86   Pulse 74   Ht 5' 8 (1.727 m)   Wt 172 lb (78 kg)   BMI 26.15 kg/m  Constitutional: Pleasant,well-developed, Caucasian male in no acute distress. HEENT: Normocephalic and atraumatic. Conjunctivae are normal. No scleral icterus. Neck supple.  Cardiovascular: Normal rate, regular rhythm.  Pulmonary/chest: Effort normal and breath sounds normal. No wheezing, rales or rhonchi. Abdominal: Soft, nondistended, nontender. Bowel sounds active throughout. There are no masses palpable. No hepatomegaly. Extremities: no edema Neurological: Alert and oriented to person place and time. Skin: Skin is warm and dry. No rashes noted. Psychiatric: Normal mood and affect. Behavior is normal.  CBC    Component Value Date/Time   WBC 10.7 (H) 05/19/2024 0409   RBC 2.80 (L) 05/19/2024 0409   HGB 9.6 (L) 05/19/2024 0409   HGB 14.6 02/22/2024 0848   HCT 27.6 (L) 05/19/2024 0409   HCT 43.1 02/22/2024 0848    PLT 575 (H) 05/19/2024 0409   PLT 328 02/22/2024 0848   MCV 98.6 05/19/2024 0409   MCV 104 (H) 02/22/2024 0848   MCH 34.3 (H) 05/19/2024 0409   MCHC 34.8 05/19/2024 0409   RDW 12.1 05/19/2024 0409   RDW 11.7 02/22/2024 0848   LYMPHSABS 0.6 (L) 05/08/2024 2045   LYMPHSABS 1.3 02/22/2024 0848   MONOABS 1.4 (H) 05/08/2024 2045   EOSABS 0.0 05/08/2024 2045   EOSABS 0.1 02/22/2024 0848   BASOSABS 0.0 05/08/2024 2045   BASOSABS 0.0 02/22/2024 0848    CMP     Component Value Date/Time   NA 134 07/10/2024 0850   K 4.3 07/10/2024 0850   CL 95 (L) 07/10/2024 0850   CO2 19 (L) 07/10/2024 0850   GLUCOSE 88 07/10/2024 0850   GLUCOSE 96 05/19/2024 0409   BUN 9 07/10/2024 0850   CREATININE 0.72 (L) 07/10/2024 0850   CALCIUM  8.9 07/10/2024 0850   PROT 7.2 07/10/2024 0850   ALBUMIN  4.5 07/10/2024 0850   AST 22 07/10/2024 0850   ALT 24 07/10/2024 0850   ALKPHOS 109 07/10/2024 0850   BILITOT 0.6 07/10/2024 0850   GFRNONAA >60 05/19/2024 0409   GFRAA >90 03/27/2012 1446       Latest Ref Rng & Units 05/19/2024    4:09 AM 05/18/2024    3:57 AM 05/17/2024    9:08 AM  CBC EXTENDED  WBC 4.0 - 10.5 K/uL 10.7  11.1  12.1   RBC 4.22 - 5.81 MIL/uL 2.80  3.01  3.38   Hemoglobin 13.0 - 17.0 g/dL 9.6  89.6  88.2   HCT 60.9 - 52.0 % 27.6  29.8  35.5   Platelets 150 - 400 K/uL 575  535  447       ASSESSMENT AND PLAN:  73 year old male with history of coronary artery disease status post CABG, who was found to have mild reflux esophagitis on an upper endoscopy in 2023 in the setting of persistent epigastric pain.  He has been on twice daily pantoprazole  ever since then.  He denies any symptoms of GERD, dysphagia or epigastric pain.  He has not tried stopping his pantoprazole  since it was started almost 3 years ago. It would be very reasonable for  him to wean off his pantoprazole  given his very mild reflux esophagitis symptoms. He has no history of upper GI bleeding, but is at increased risk due to  his aspirin  and Eliquis  therapy.  This is not a contraindication for him to be off of PPI, however. Recommended that the patient wean off pantoprazole  by taking it once daily for a week, then once every other day for 2 weeks before stopping.  He can take as needed Tums or over-the-counter Pepcid for any dyspepsia or GERD symptoms. If he finds himself needing to take these medications frequently, he can resume Protonix  at the lowest effective dose  GERD with esophagitis - Wean off pantoprazole : 40 mg once daily for one week, then every other day for two weeks, then stop. - Use Tums or OTC Pepcid for breakthrough symptoms. - Restart pantoprazole  at lowest effective dose if symptoms return.  CAD/A-fib - Continue ASA, Eliquis  - Follow up with cardiology  History of colon polyps - Surveillance colonoscopy in June 2027 pending medical status at that time  Recording duration: 13 minutes     I spent a total of 30 minutes reviewing the patient's medical record, interviewing and examining the patient, discussing his diagnosis and management of his condition going forward, and documenting in the medical record      Eksir, Lucie LABOR, MD

## 2024-08-27 ENCOUNTER — Encounter (HOSPITAL_COMMUNITY)

## 2024-08-29 ENCOUNTER — Encounter (HOSPITAL_COMMUNITY)

## 2024-09-01 ENCOUNTER — Encounter (HOSPITAL_COMMUNITY)

## 2024-09-03 ENCOUNTER — Encounter (HOSPITAL_COMMUNITY)

## 2024-09-05 ENCOUNTER — Encounter (HOSPITAL_COMMUNITY)

## 2024-09-08 ENCOUNTER — Telehealth: Payer: Self-pay | Admitting: Cardiology

## 2024-09-08 ENCOUNTER — Encounter (HOSPITAL_COMMUNITY)

## 2024-09-08 NOTE — Telephone Encounter (Signed)
" °*  STAT* If patient is at the pharmacy, call can be transferred to refill team.   1. Which medications need to be refilled? (please list name of each medication and dose if known)   metoprolol  succinate (TOPROL -XL) 25 MG 24 hr tablet   2. Would you like to learn more about the convenience, safety, & potential cost savings by using the [facility]?   3. Are you open to using the [facility] (Type [facility]. ).  4. Which pharmacy/location (including street and city if local pharmacy) is medication to be sent to?  [id number] - [place], [place] - [address] AT [facility] OF [address]   5. Do they need a 30 day or 90 day supply?     Patient stated will be out of this medication at the end of this week.  Patient has appointment scheduled with Dr. Elmira on 1/22.  "

## 2024-09-09 MED ORDER — METOPROLOL SUCCINATE ER 25 MG PO TB24: 12.5000 mg | ORAL_TABLET | Freq: Every day | ORAL | 3 refills | Status: DC

## 2024-09-09 NOTE — Telephone Encounter (Signed)
 Refill sent

## 2024-09-10 ENCOUNTER — Encounter (HOSPITAL_COMMUNITY)

## 2024-09-11 DIAGNOSIS — I4891 Unspecified atrial fibrillation: Secondary | ICD-10-CM | POA: Diagnosis not present

## 2024-09-12 ENCOUNTER — Encounter (HOSPITAL_COMMUNITY)

## 2024-09-15 ENCOUNTER — Encounter (HOSPITAL_COMMUNITY)

## 2024-09-17 ENCOUNTER — Encounter (HOSPITAL_COMMUNITY)

## 2024-09-19 ENCOUNTER — Encounter (HOSPITAL_COMMUNITY)

## 2024-09-22 ENCOUNTER — Encounter (HOSPITAL_COMMUNITY)

## 2024-09-24 ENCOUNTER — Encounter (HOSPITAL_COMMUNITY)

## 2024-09-26 ENCOUNTER — Encounter (HOSPITAL_COMMUNITY)

## 2024-09-29 ENCOUNTER — Encounter (HOSPITAL_COMMUNITY)

## 2024-09-29 ENCOUNTER — Other Ambulatory Visit: Payer: Self-pay | Admitting: Physician Assistant

## 2024-10-01 ENCOUNTER — Encounter (HOSPITAL_COMMUNITY)

## 2024-10-03 ENCOUNTER — Telehealth: Payer: Self-pay | Admitting: Cardiology

## 2024-10-03 ENCOUNTER — Encounter (HOSPITAL_COMMUNITY)

## 2024-10-03 NOTE — Telephone Encounter (Signed)
" °*  STAT* If patient is at the pharmacy, call can be transferred to refill team.   1. Which medications need to be refilled? (please list name of each medication and dose if known)   amLODipine  (NORVASC ) 2.5 MG tablet    2. Would you like to learn more about the convenience, safety, & potential cost savings by using the [facility]? no   3. Are you open to using the [facility] (Type [facility]. no   4. Which pharmacy/location (including street and city if local pharmacy) is medication to be sent to?    [id number] - [place], [place] - [address] AT [facility] OF [address]   5. Do they need a 30 day or 90 day supply? 90 day  "

## 2024-10-03 NOTE — Telephone Encounter (Signed)
 If it can wait till office visit on 10/09/2024, I can address it then.  If not, okay to refill.  Thanks MJP

## 2024-10-03 NOTE — Telephone Encounter (Signed)
 Pt c/o medication issue:  1. Name of Medication: atorvastatin  (LIPITOR ) 80 MG tablet   2. How are you currently taking this medication (dosage and times per day)? As written  3. Are you having a reaction (difficulty breathing--STAT)? no  4. What is your medication issue?    Pt in need of medication that was prescribed by hospital PA and he is no longer under her care. Please advise.

## 2024-10-03 NOTE — Telephone Encounter (Signed)
 Spoke with pt, he reports it can wait until his follow up appointment.

## 2024-10-06 ENCOUNTER — Encounter (HOSPITAL_COMMUNITY)

## 2024-10-08 ENCOUNTER — Encounter (HOSPITAL_COMMUNITY)

## 2024-10-08 MED ORDER — AMLODIPINE BESYLATE 2.5 MG PO TABS: ORAL_TABLET | ORAL | 2 refills | Status: DC

## 2024-10-08 NOTE — Telephone Encounter (Signed)
 Pt's medication was sent to pt's pharmacy as requested. Confirmation received.

## 2024-10-09 ENCOUNTER — Encounter: Payer: Self-pay | Admitting: Cardiology

## 2024-10-09 ENCOUNTER — Ambulatory Visit: Admitting: Cardiology

## 2024-10-09 VITALS — BP 126/60 | HR 64 | Ht 69.0 in | Wt 172.0 lb

## 2024-10-09 DIAGNOSIS — E782 Mixed hyperlipidemia: Secondary | ICD-10-CM

## 2024-10-09 DIAGNOSIS — I471 Supraventricular tachycardia, unspecified: Secondary | ICD-10-CM | POA: Diagnosis not present

## 2024-10-09 DIAGNOSIS — I2510 Atherosclerotic heart disease of native coronary artery without angina pectoris: Secondary | ICD-10-CM

## 2024-10-09 DIAGNOSIS — I480 Paroxysmal atrial fibrillation: Secondary | ICD-10-CM | POA: Diagnosis not present

## 2024-10-09 MED ORDER — ATORVASTATIN CALCIUM 80 MG PO TABS: 80.0000 mg | ORAL_TABLET | Freq: Every day | ORAL | 1 refills | Status: AC

## 2024-10-09 MED ORDER — METOPROLOL SUCCINATE ER 25 MG PO TB24: 25.0000 mg | ORAL_TABLET | Freq: Every day | ORAL | 3 refills | Status: AC

## 2024-10-09 MED ORDER — APIXABAN 5 MG PO TABS: 5.0000 mg | ORAL_TABLET | Freq: Two times a day (BID) | ORAL | 5 refills | Status: AC

## 2024-10-09 NOTE — Patient Instructions (Addendum)
 Medication Instructions:  STOP Amlodipine   STOP Aspirin    CHANGE Metoprolol  to 25 mg daily   *If you need a refill on your cardiac medications before your next appointment, please call your pharmacy*  Follow-Up: At [facility], you and your health needs are our priority.  As part of our continuing mission to provide you with exceptional heart care, our providers are all part of one team.  This team includes your primary Cardiologist (physician) and Advanced Practice Providers or APPs (Physician Assistants and Nurse Practitioners) who all work together to provide you with the care you need, when you need it.  REFERRAL TO CARDIAC REHAB  Your next appointment:   6 month(s)  Provider:   [name], MD

## 2024-10-09 NOTE — Progress Notes (Signed)
 " Cardiology Office Note:  .   Date:  10/09/2024  ID:  Edgar Garcia, DOB 07-23-1951, MRN [id number] PCP: Freddrick Johns  [facility] Providers Cardiologist:  Newman Lawrence, MD PCP: Pcp, No  Chief Complaint  Patient presents with   Coronary Artery Disease      History of Present Illness: .    Edgar Garcia is a 74 y.o. male with hypertension, hyperlipidemia, prediabetes, CAD s/p CBAG (LIMA-LAD, SVG-diag), colon polyps without active bleeding, h/o recurrent syncope, h/o hyponatremia  Patient is doing well from cardiac standpoint, denies any exertional chest pain, dyspnea symptoms.  However, his post op recovery has been hampered by back pain, which has been attributed to muscle spasms.  He has been on medical therapy, including steroid by her PCP, with some improvement.  He had blood pressure spikes during his back pain episodes, which have since improved.  He was noticed to have A-fib after his surgery, and had intermittent episodes even in December 2025, therefore continues on Eliquis .  However, he has done well in spite of discontinuing amiodarone , without high A-fib burden.  Vitals:   10/09/24 0959  BP: 126/60  Pulse: 64  SpO2: 98%     ROS:  Review of Systems  Cardiovascular:  Negative for chest pain, dyspnea on exertion, leg swelling, palpitations and syncope.  Musculoskeletal:  Positive for back pain.     Studies Reviewed: SABRA        EKG 05/06/2024: Sinus rhythm 64 bpm Normal EKG  Op note 04/2024 Dr. Shyrl: CABG X 2 (LIMA-LAD, rSVG-diag)  Echocardiogram 03/2024: 1. Left ventricular ejection fraction, by estimation, is 55 to 60%. The  left ventricle has normal function. The left ventricle has no regional  wall motion abnormalities. Left ventricular diastolic parameters are  indeterminate.   2. Right ventricular systolic function is normal. The right ventricular  size is normal. There is mildly elevated pulmonary artery systolic  pressure.   3. The mitral valve  is degenerative. Mild mitral valve regurgitation. No  evidence of mitral stenosis.   4. The aortic valve is calcified. Aortic valve regurgitation is not  visualized. Aortic valve sclerosis/calcification is present, without any  evidence of aortic stenosis.   5. Aortic dilatation noted. There is mild dilatation of the ascending  aorta, measuring 38 mm.   6. The inferior vena cava is normal in size with greater than 50%  respiratory variability, suggesting right atrial pressure of 3 mmHg.   Coronary angiography 02/28/2024: LM: Normal LAD: Severely calcified and tortuous vessel          Proximal diffuse 70% disease          Mid 99% stenosis (likely healed dissection from previously unsuccessful PCI attempt)          Distal vessel gets antegrade flow through a small channel Lcx: Large dominant vessel, no significant disease RCA: Small nondominant vessel, proximal 80% stenosis   LVEDP 26 mmHg    PET/CT stress test 11/06/2023:   Findings are consistent with infarction with peri-infarct moderate to severe LAD ischemia. The study is high risk due to MBFR 1.24 with LAD territory ischemia and associated reduced LAD stress flow   LV perfusion is abnormal. There is evidence of ischemia. There is evidence of infarction. Defect 1: There is a medium defect with severe reduction in uptake present in the apical to mid anterior and apex location(s) that is partially reversible. There is abnormal wall motion in the defect area. Consistent with peri-infarct ischemia.  Rest left ventricular function is normal. Rest EF: 59%. Stress left ventricular function is normal. Stress EF: 60%. End diastolic cavity size is normal. End systolic cavity size is normal.   Myocardial blood flow was computed to be 1.20ml/g/min at rest and 1.63ml/g/min at stress. Global myocardial blood flow reserve was 1.24 and was abnormal.   Coronary calcium  was present on the attenuation correction CT images. Severe coronary calcifications  were present. Coronary calcifications were present in the left anterior descending artery, left circumflex artery and right coronary artery distribution(s).Aortic Valve calcification  Echocardiogram 10/13/2021: Normal LV systolic function with visual EF 55-60%. Left ventricle cavity is normal in size. Normal left ventricular wall thickness. Normal global wall motion. Normal diastolic filling pattern, normal LAP. Aortic valve sclerosis without stenosis. Trace aortic regurgitation. Mild (Grade I) mitral regurgitation. Mild tricuspid regurgitation. No evidence of pulmonary hypertension. Compared to study 07/27/2021: LVEF remains preserved, no obvious RWMA on current study, G1DD is now normal, otherwise no significant change.  Labs 06/2024: dLDL 55, Lp(a) 12 Hb 9.6 Cr 0.72, Na 134  Labs 04/2024: HbA1C 5.3% Hb 16.0 Cr 0.83, Na 130   09/2023: Chol 144, TG 52, HDL 76, LDL 57 Hb 15.3 Cr 0.7, Na 867    Physical Exam:   Physical Exam Vitals and nursing note reviewed.  Constitutional:      General: He is not in acute distress. Neck:     Vascular: No JVD.  Cardiovascular:     Rate and Rhythm: Normal rate and regular rhythm.     Heart sounds: Normal heart sounds. No murmur heard. Pulmonary:     Effort: Pulmonary effort is normal.     Breath sounds: Normal breath sounds. No wheezing or rales.  Musculoskeletal:     Right lower leg: No edema.     Left lower leg: No edema.      VISIT DIAGNOSES:   ICD-10-CM   1. Coronary artery disease involving native coronary artery of native heart without angina pectoris  I25.10 AMB referral to cardiac rehabilitation    2. Mixed hyperlipidemia  E78.2     3. PSVT (paroxysmal supraventricular tachycardia)  I47.10     4. PAF (paroxysmal atrial fibrillation) (HCC)  I48.0         ASSESSMENT AND PLAN: .    Edgar Garcia is a 73 y.o. male with hypertension, hyperlipidemia, prediabetes, CAD s/p CABG X 2(LIMA-LAD, SVG-diag), colon polyps  without active bleeding, h/o recurrent syncope, h/o hyponatremia  CAD: CABG X 2(LIMA-LAD, SVG-diag). Nodular symptoms at this time. Given ongoing use of Eliquis  for A-fib anticoagulation, I will stop aspirin  in light of his prior colon polyp and GI bleeding history. Lipids well-controlled on statin.  PSVT, paroxysmal A-fib: No A-fib burden.  Continue Eliquis  5 mg twice daily. Given intermittent palpitations and PSVT, continue with Rocephin, increased to 25 mg daily.  I will stop amlodipine  2.5 mg daily, and use only if blood pressure is elevated on metoprolol  succinate 25 mg daily.  Metoprolol  succinate can also be increased to 50 mg daily, if necessary.  Mixed hyperlipidemia: Continue Lipitor  80 mg daily.  F/u in 6 months  Signed, [name], MD  "

## 2024-10-10 ENCOUNTER — Encounter (HOSPITAL_COMMUNITY)

## 2024-10-13 ENCOUNTER — Encounter (HOSPITAL_COMMUNITY)

## 2024-10-14 ENCOUNTER — Telehealth (HOSPITAL_COMMUNITY): Payer: Self-pay

## 2024-10-14 NOTE — Telephone Encounter (Signed)
Called patient to see if he is interested in the Cardiac Rehab Program. Patient expressed interest. Explained scheduling process and went over insurance, patient verbalized understanding. Will contact patient for scheduling at a later date. 

## 2024-10-14 NOTE — Telephone Encounter (Signed)
 Pt insurance is active and benefits verified through [facility] Co-pay $30, DED 0/0 met, out of pocket $9,250/0 met, co-insurance 0%. no pre-authorization required. Passport, 10/14/2024@11 :53, REF# [id number]   TCR/ICR? ICR Visit(date of service)limitation? 36 Can multiple codes be used on the same date of service/visit?(IF ITS A LIMIT) yes    Is this a lifetime maximum or an annual maximum? annual Has the member used any of these services to date? no Is there a time limit (weeks/months) on start of program and/or program completion? no   Will contact patient to see if he is interested in the Cardiac Rehab Program. If interested, patient will need to complete follow up appt. Once completed, patient will be contacted for scheduling upon review by the RN Navigator.

## 2024-10-15 ENCOUNTER — Encounter (HOSPITAL_COMMUNITY)

## 2024-10-17 ENCOUNTER — Encounter (HOSPITAL_COMMUNITY)

## 2024-10-20 ENCOUNTER — Ambulatory Visit (HOSPITAL_COMMUNITY): Admission: RE | Admit: 2024-10-20 | Source: Ambulatory Visit

## 2024-10-20 ENCOUNTER — Encounter (HOSPITAL_COMMUNITY)

## 2024-10-22 ENCOUNTER — Encounter (HOSPITAL_COMMUNITY)

## 2024-11-04 ENCOUNTER — Ambulatory Visit (HOSPITAL_COMMUNITY)
